# Patient Record
Sex: Male | Born: 1965
Health system: Southern US, Community
[De-identification: ages and names within clinical notes are randomized; demographics above are authoritative.]

## PROBLEM LIST (undated history)

## (undated) DIAGNOSIS — F419 Anxiety disorder, unspecified: Secondary | ICD-10-CM

## (undated) DIAGNOSIS — R001 Bradycardia, unspecified: Secondary | ICD-10-CM

## (undated) DIAGNOSIS — K219 Gastro-esophageal reflux disease without esophagitis: Secondary | ICD-10-CM

## (undated) DIAGNOSIS — M5030 Other cervical disc degeneration, unspecified cervical region: Secondary | ICD-10-CM

## (undated) DIAGNOSIS — Z720 Tobacco use: Secondary | ICD-10-CM

## (undated) DIAGNOSIS — F329 Major depressive disorder, single episode, unspecified: Secondary | ICD-10-CM

## (undated) DIAGNOSIS — F32A Depression, unspecified: Secondary | ICD-10-CM

## (undated) DIAGNOSIS — F1010 Alcohol abuse, uncomplicated: Secondary | ICD-10-CM

## (undated) DIAGNOSIS — Q7649 Other congenital malformations of spine, not associated with scoliosis: Secondary | ICD-10-CM

## (undated) DIAGNOSIS — F1410 Cocaine abuse, uncomplicated: Secondary | ICD-10-CM

## (undated) DIAGNOSIS — E119 Type 2 diabetes mellitus without complications: Secondary | ICD-10-CM

## (undated) HISTORY — DX: Cocaine abuse, uncomplicated: F14.10

## (undated) HISTORY — DX: Major depressive disorder, single episode, unspecified: F32.9

## (undated) HISTORY — DX: Gastro-esophageal reflux disease without esophagitis: K21.9

## (undated) HISTORY — DX: Depression, unspecified: F32.A

## (undated) HISTORY — DX: Tobacco use: Z72.0

## (undated) HISTORY — DX: Alcohol abuse, uncomplicated: F10.10

## (undated) HISTORY — DX: Other cervical disc degeneration, unspecified cervical region: M50.30

## (undated) HISTORY — DX: Anxiety disorder, unspecified: F41.9

## (undated) HISTORY — DX: Other congenital malformations of spine, not associated with scoliosis: Q76.49

---

## 2002-08-08 ENCOUNTER — Emergency Department (HOSPITAL_COMMUNITY): Admission: EM | Admit: 2002-08-08 | Discharge: 2002-08-08 | Payer: Self-pay | Admitting: Emergency Medicine

## 2004-06-05 ENCOUNTER — Emergency Department (HOSPITAL_COMMUNITY): Admission: EM | Admit: 2004-06-05 | Discharge: 2004-06-05 | Payer: Self-pay

## 2004-09-12 ENCOUNTER — Emergency Department (HOSPITAL_COMMUNITY): Admission: EM | Admit: 2004-09-12 | Discharge: 2004-09-12 | Payer: Self-pay | Admitting: Emergency Medicine

## 2006-08-31 ENCOUNTER — Emergency Department (HOSPITAL_COMMUNITY): Admission: EM | Admit: 2006-08-31 | Discharge: 2006-08-31 | Payer: Self-pay | Admitting: Emergency Medicine

## 2008-11-23 ENCOUNTER — Emergency Department (HOSPITAL_COMMUNITY): Admission: EM | Admit: 2008-11-23 | Discharge: 2008-11-23 | Payer: Self-pay | Admitting: Emergency Medicine

## 2009-11-14 ENCOUNTER — Emergency Department (HOSPITAL_COMMUNITY): Admission: EM | Admit: 2009-11-14 | Discharge: 2009-11-14 | Payer: Self-pay | Admitting: Emergency Medicine

## 2010-01-23 ENCOUNTER — Emergency Department (HOSPITAL_COMMUNITY): Admission: EM | Admit: 2010-01-23 | Discharge: 2010-01-23 | Payer: Self-pay | Admitting: Emergency Medicine

## 2010-09-16 LAB — URINE MICROSCOPIC-ADD ON

## 2010-09-16 LAB — URINALYSIS, ROUTINE W REFLEX MICROSCOPIC
Bilirubin Urine: NEGATIVE
Glucose, UA: NEGATIVE mg/dL
Ketones, ur: NEGATIVE mg/dL
Nitrite: NEGATIVE
Protein, ur: NEGATIVE mg/dL
Specific Gravity, Urine: 1.023 (ref 1.005–1.030)
Urobilinogen, UA: 0.2 mg/dL (ref 0.0–1.0)
pH: 5 (ref 5.0–8.0)

## 2010-11-06 ENCOUNTER — Emergency Department (HOSPITAL_COMMUNITY): Payer: Self-pay

## 2010-11-06 ENCOUNTER — Emergency Department (HOSPITAL_COMMUNITY)
Admission: EM | Admit: 2010-11-06 | Discharge: 2010-11-06 | Discharge status: Against Medical Advice | Payer: Self-pay | Attending: Emergency Medicine | Admitting: Emergency Medicine

## 2010-11-06 ENCOUNTER — Observation Stay (HOSPITAL_COMMUNITY)
Admission: EM | Admit: 2010-11-06 | Discharge: 2010-11-07 | Disposition: A | Discharge status: Home / Self Care | Payer: Self-pay | Attending: Internal Medicine | Admitting: Internal Medicine

## 2010-11-06 ENCOUNTER — Encounter: Payer: Self-pay | Admitting: Ophthalmology

## 2010-11-06 DIAGNOSIS — J309 Allergic rhinitis, unspecified: Secondary | ICD-10-CM | POA: Insufficient documentation

## 2010-11-06 DIAGNOSIS — R0789 Other chest pain: Secondary | ICD-10-CM | POA: Insufficient documentation

## 2010-11-06 DIAGNOSIS — I498 Other specified cardiac arrhythmias: Secondary | ICD-10-CM | POA: Insufficient documentation

## 2010-11-06 DIAGNOSIS — R001 Bradycardia, unspecified: Secondary | ICD-10-CM

## 2010-11-06 DIAGNOSIS — M5030 Other cervical disc degeneration, unspecified cervical region: Secondary | ICD-10-CM | POA: Insufficient documentation

## 2010-11-06 DIAGNOSIS — R071 Chest pain on breathing: Secondary | ICD-10-CM | POA: Insufficient documentation

## 2010-11-06 DIAGNOSIS — M1990 Unspecified osteoarthritis, unspecified site: Secondary | ICD-10-CM

## 2010-11-06 DIAGNOSIS — R079 Chest pain, unspecified: Secondary | ICD-10-CM

## 2010-11-06 DIAGNOSIS — F1910 Other psychoactive substance abuse, uncomplicated: Secondary | ICD-10-CM | POA: Insufficient documentation

## 2010-11-06 DIAGNOSIS — Q7649 Other congenital malformations of spine, not associated with scoliosis: Secondary | ICD-10-CM

## 2010-11-06 LAB — DIFFERENTIAL
Basophils Absolute: 0.1 10*3/uL (ref 0.0–0.1)
Basophils Absolute: 0.1 10*3/uL (ref 0.0–0.1)
Basophils Relative: 1 % (ref 0–1)
Basophils Relative: 1 % (ref 0–1)
Eosinophils Absolute: 0.3 10*3/uL (ref 0.0–0.7)
Eosinophils Absolute: 0.2 10*3/uL (ref 0.0–0.7)
Eosinophils Relative: 4 % (ref 0–5)
Eosinophils Relative: 3 % (ref 0–5)
Lymphocytes Relative: 37 % (ref 12–46)
Lymphocytes Relative: 30 % (ref 12–46)
Lymphs Abs: 3.4 10*3/uL (ref 0.7–4.0)
Monocytes Absolute: 0.7 10*3/uL (ref 0.1–1.0)
Monocytes Absolute: 0.5 10*3/uL (ref 0.1–1.0)
Monocytes Relative: 8 % (ref 3–12)
Monocytes Relative: 6 % (ref 3–12)
Neutro Abs: 4.6 10*3/uL (ref 1.7–7.7)
Neutro Abs: 4.8 10*3/uL (ref 1.7–7.7)
Neutrophils Relative %: 50 % (ref 43–77)
Neutrophils Relative %: 59 % (ref 43–77)

## 2010-11-06 LAB — CBC
HCT: 40.4 % (ref 39.0–52.0)
HCT: 41 % (ref 39.0–52.0)
HCT: 38.9 % — ABNORMAL LOW (ref 39.0–52.0)
Hemoglobin: 13.5 g/dL (ref 13.0–17.0)
Hemoglobin: 13.5 g/dL (ref 13.0–17.0)
MCH: 29 pg (ref 26.0–34.0)
MCH: 30 pg (ref 26.0–34.0)
MCHC: 33.4 g/dL (ref 30.0–36.0)
MCHC: 34.9 g/dL (ref 30.0–36.0)
MCHC: 34.7 g/dL (ref 30.0–36.0)
MCV: 86.1 fL (ref 78.0–100.0)
MCV: 86.6 fL (ref 78.0–100.0)
Platelets: 204 10*3/uL (ref 150–400)
Platelets: 215 10*3/uL (ref 150–400)
Platelets: 182 10*3/uL (ref 150–400)
RBC: 4.66 MIL/uL (ref 4.22–5.81)
RBC: 4.76 MIL/uL (ref 4.22–5.81)
RBC: 4.49 MIL/uL (ref 4.22–5.81)
RDW: 14.8 % (ref 11.5–15.5)
RDW: 14.9 % (ref 11.5–15.5)
RDW: 14.8 % (ref 11.5–15.5)
WBC: 9.1 10*3/uL (ref 4.0–10.5)

## 2010-11-06 LAB — BASIC METABOLIC PANEL
BUN: 11 mg/dL (ref 6–23)
BUN: 11 mg/dL (ref 6–23)
CO2: 25 mEq/L (ref 19–32)
CO2: 32 mEq/L (ref 19–32)
Calcium: 9.4 mg/dL (ref 8.4–10.5)
Calcium: 9.2 mg/dL (ref 8.4–10.5)
Creatinine, Ser: 1.08 mg/dL (ref 0.4–1.5)
Creatinine, Ser: 1.15 mg/dL (ref 0.4–1.5)
GFR calc Af Amer: >60 mL/min (ref >60)
GFR calc Af Amer: >60 mL/min (ref >60)
GFR calc non Af Amer: >60 mL/min (ref >60)
GFR calc non Af Amer: >60 mL/min (ref >60)
Glucose, Bld: 99 mg/dL (ref 70–99)
Glucose, Bld: 92 mg/dL (ref 70–99)
Sodium: 141 mEq/L (ref 135–145)
Sodium: 142 mEq/L (ref 135–145)

## 2010-11-06 LAB — HEPATIC FUNCTION PANEL
ALT: 12 U/L (ref 0–53)
AST: 14 U/L (ref 0–37)
Albumin: 3.6 g/dL (ref 3.5–5.2)
Alkaline Phosphatase: 59 U/L (ref 39–117)
Bilirubin, Direct: <0.1 mg/dL (ref 0.0–0.3)
Total Bilirubin: 0.3 mg/dL (ref 0.3–1.2)
Total Protein: 6.4 g/dL (ref 6.0–8.3)

## 2010-11-06 LAB — CARDIAC PANEL(CRET KIN+CKTOT+MB+TROPI)
CK, MB: 1.6 ng/mL (ref 0.3–4.0)
Relative Index: 1.4 (ref 0.0–2.5)
Total CK: 111 U/L (ref 7–232)
Troponin I: <0.3 ng/mL (ref <0.30)

## 2010-11-06 LAB — POCT CARDIAC MARKERS
CKMB, poc: <1 ng/mL — ABNORMAL LOW (ref 1.0–8.0)
CKMB, poc: <1 ng/mL — ABNORMAL LOW (ref 1.0–8.0)
Myoglobin, poc: 29.4 ng/mL (ref 12–200)
Myoglobin, poc: 26.3 ng/mL (ref 12–200)
Troponin i, poc: <0.05 ng/mL (ref 0.00–0.09)
Troponin i, poc: <0.05 ng/mL (ref 0.00–0.09)

## 2010-11-06 LAB — RAPID URINE DRUG SCREEN, HOSP PERFORMED
Cocaine: POSITIVE — AB
Opiates: NOT DETECTED
Tetrahydrocannabinol: POSITIVE — AB

## 2010-11-06 LAB — HEMOGLOBIN A1C
Hgb A1c MFr Bld: 6.1 % — ABNORMAL HIGH (ref <5.7)
Mean Plasma Glucose: 128 mg/dL — ABNORMAL HIGH (ref <117)

## 2010-11-06 LAB — CK TOTAL AND CKMB (NOT AT ARMC)
CK, MB: 1.7 ng/mL (ref 0.3–4.0)
Relative Index: 1.3 (ref 0.0–2.5)

## 2010-11-06 LAB — TROPONIN I: Troponin I: <0.3 ng/mL (ref <0.30)

## 2010-11-06 NOTE — H&P (Signed)
Hospital Admission Note Date: 11/06/2010  Patient name: William Wood Medical record number: [id number] Date of birth: [birth year] Age: 45 y.o. Gender: male PCP: No primary provider on file.  Medical Service: Internal Medicine Teaching Service  Attending physician: Dr. Onalee Hua Resident [phone]): Dr. Arvilla Market    [phone] Resident (R1): Dr. Cathey Endow    Pager: [phone]  Chief Complaint: Chest Pain  History of Present Illness: This is a 45 year old male with past medical history significant for GERD who presents because of a several week history of chest pain. The patient states that over the last several weeks, has had intermittent chest pain that was substernal in location and did not radiate. The pain is made worse by movement, but not particularly associated with exertion. There is no particular inciting event, and this does not appear to be related to food intake. Last night, the patient had a large meal between 6 and 8 PM, states that he laid down to bed and developed severe substernal chest pain. This chest pain was similar to prior events but worse, it was associated with mild shortness of breath but no diaphoresis, nausea, or vomiting. The patient's pain is a 9/10 in intensity when it occurs, is intermittent, and described as sharp. The pain lasts for approximately 5 seconds and occurs every 30 seconds. The patient has not tried any medications for his symptoms, but because of his concern, he came to the ED on the morning of admission. The patient was evaluated, but decided to leave AMA. On the way back to the bus stop, the patient developed worsening chest pain once again, and decided to return to the ED for admission. At the time of his evaluation, the patient was receiving a GI cocktail for his symptoms, he did not receive any aspirin during this ER visit because he had received it in his prior ED visit.  At the time of his admission, the patient denied any abdominal pain, nausea, vomiting, recent  fevers or chills, but he still had moderate chest pain.  Allergies: No known drug allergies  Past Medical History: GERD, allergic rhinitis. 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 Degenerative disc disease at C5-C6.  Family History: Grandmother with heart failure, grandfather with unknown cancer.  Social History Single, lives in [place], lives with a roommate. The patient has smoked one pack a day since he was 45 years old. The patient uses marijuana as well as cocaine, and last used cocaine 3 days ago.  Review of Systems: Negative as per HPI.   VITALS: T: 98.5 P:50  BP:135/88  R:24  O2SAT: 100% ON:RA  PHYSICAL EXAM: General:  alert, well-developed, and cooperative to examination.   Head:  normocephalic and atraumatic.   Eyes:  vision grossly intact, pupils equal, pupils round, pupils reactive to light, no injection and anicteric.   Mouth:  pharynx pink and moist, no erythema, and no exudates.   Neck:  supple, full ROM, no thyromegaly, no JVD, and no carotid bruits.   Lungs:  normal respiratory effort, no accessory muscle use, normal breath sounds, no crackles, and no wheezes.  Heart: Difficult to auscultate, bradycardia, regular rhythm, no obvious murmur, no gallop, and no rub.  No tenderness to chest palpation. Abdomen:  soft, non-tender, normal bowel sounds, no distention, no guarding, no rebound tenderness, no hepatomegaly, and no splenomegaly.   Msk:  no joint swelling, no joint warmth, and no redness over joints.   Pulses:  2+ DP/PT pulses bilaterally Extremities:  No cyanosis, clubbing, edema  Neurologic:  alert &  oriented X3, cranial nerves II-XII intact, strength normal in all extremities, sensation intact to light touch.  Skin:  turgor normal and no rashes.   Psych: Not depressed or anxious appearing.  LABS:  WBC                                      9.1               4.0-10.5         K/uL  RBC                                      4.66              4.22-5.81         MIL/uL  Hemoglobin (HGB)                         13.5              13.0-17.0        g/dL  Hematocrit (HCT)                         40.4              39.0-52.0        %  MCV                                      86.7              78.0-100.0       fL  MCH -                                    29.0              26.0-34.0        pg  MCHC                                     33.4              30.0-36.0        g/dL  RDW                                      14.8              11.5-15.5        %  Platelet Count (PLT)                     204               150-400          K/uL  Neutrophils, %                           50  43-77            %  Lymphocytes, %                           37                12-46            %  Monocytes, %                             8                 3-12             %  Eosinophils, %                           4                 0-5              %  Basophils, %                             1                 0-1              %  Neutrophils, Absolute                    4.6               1.7-7.7          K/uL  Lymphocytes, Absolute                    3.4               0.7-4.0          K/uL  Monocytes, Absolute                      0.7               0.1-1.0          K/uL  Eosinophils, Absolute                    0.3               0.0-0.7          K/uL  Basophils, Absolute                      0.1               0.0-0.1          K/uL   Sodium (NA)                              141               135-145          mEq/L  Potassium (K)                            4.0  3.5-5.1          mEq/L  Chloride                                 108               96-112           mEq/L  CO2                                      25                19-32            mEq/L  Glucose                                  99                70-99            mg/dL  BUN                                      11                6-23             mg/dL  Creatinine                               1.08               0.4-1.5          mg/dL  GFR, Est Non African American            >60               >60              mL/min  GFR, Est African American                >60               >60              mL/min    Oversized comment, see footnote  1  Calcium                                  9.4               8.4-10.5         mg/dL    Amphetamins                              SEE NOTE.         NDT    NONE DETECTED  Barbiturates                             SEE NOTE.         NDT    Oversized comment, see footnote  1  Benzodiazepines  SEE NOTE.         NDT    NONE DETECTED  Cocaine                                  POSITIVE   a      NDT  Opiates                                  SEE NOTE.         NDT    NONE DETECTED  Tetrahydrocannabinol                     POSITIVE   a      NDT   CKMB, POC                                <1.0       l      1.0-8.0          ng/mL  Troponin I, POC                          <0.05             0.00-0.09        ng/mL  Myoglobin, POC                           26.3              12-200           Ng/mL  Creatine Kinase, Total                   126               7-232            U/L  CK, MB                                   1.7               0.3-4.0          ng/mL  Relative Index                           1.3               0.0-2.5  Troponin I                               <0.30             <0.30            Ng/mL  Bilirubin, Total                         0.3               0.3-1.2          mg/dL  Bilirubin, Direct                        <  0.1              0.0-0.3          mg/dL  Indirect Bilirubin                       SEE NOTE.         0.3-0.9          mg/dL    NOT CALCULATED  Alkaline Phosphatase                     59                39-117           U/L  SGOT (AST)                               14                0-37             U/L  SGPT (ALT)                               12                0-53             U/L  Total  Protein                            6.4               6.0-8.3          g/dL  Albumin-Blood                            3.6               3.5-5.2          g/dL   IMAGING: Chest X-ray  IMPRESSION:   No active cardiopulmonary disease.  EKG: EKG showed marked sinus bradycardia with a ventricular rate of 40, there are Q waves in leads V1 2 and 3.  ASSESSMENT AND PLAN:  This is a 45 year old gentleman with a past medical history significant for GERD who presents with chest pain in the setting of recent cocaine use.  1. Chest pain: The patient's pain is atypical in presentation, and I do not suspect any ACS at this time. Point-of-care cardiac markers were negative, there are no significant ST wave changes on EKG, though there do appear to be old Q waves in the anterior septal leads. The patient has no history of early ACS, and his pain though worsened with movement, does not appear to be associated with exertion. The differential diagnosis also includes GI etiology such as GERD, as well as pulmonary embolism though the latter is extremely unlikely given that the patient is not hypoxic. This could represent vasospasm from cocaine use, however the patient has not used cocaine in 3 days per his report. At this point, we will admit the patient to the telemetry unit, cycle cardiac enzymes x3, and get an EKG in the a.m. Because of a high suspicion for GI etiology, will start the patient on Protonix 40 mg daily, and treat him symptomatically with GI cocktail. Will also  check a TSH, hemoglobin A1c, fasting lipid panel, all for risk stratification.  2. Bradycardia: The etiology of this is unclear, but the patient appears to be asymptomatic at this time. The patient does have Q waves in his anteroseptal leads, which could represent old ischemia leading to conduction abnormalities. At this point time, we'll complete an ischemic workup, and we'll continue to monitor on telemetry. Baseline heart rate is 61- 63. So this could be related to the patient's  low Baseline heart rate.  3. prophylaxis: Lovenox.     Attending Physician: I have seen and examined the patient. I reviewed the resident/fellow note and agree with the findings and plan of care as documented. My additions and revisions are included.   Signature:   Date:

## 2010-11-07 DIAGNOSIS — J309 Allergic rhinitis, unspecified: Secondary | ICD-10-CM

## 2010-11-07 DIAGNOSIS — R001 Bradycardia, unspecified: Secondary | ICD-10-CM

## 2010-11-07 DIAGNOSIS — Q7649 Other congenital malformations of spine, not associated with scoliosis: Secondary | ICD-10-CM

## 2010-11-07 DIAGNOSIS — M1990 Unspecified osteoarthritis, unspecified site: Secondary | ICD-10-CM

## 2010-11-07 LAB — CBC
MCHC: 34.1 g/dL (ref 30.0–36.0)
Platelets: 201 10*3/uL (ref 150–400)
RDW: 14.9 % (ref 11.5–15.5)
WBC: 8.5 10*3/uL (ref 4.0–10.5)

## 2010-11-07 LAB — CARDIAC PANEL(CRET KIN+CKTOT+MB+TROPI)
CK, MB: 1.6 ng/mL (ref 0.3–4.0)
Total CK: 104 U/L (ref 7–232)

## 2010-11-07 LAB — BASIC METABOLIC PANEL
BUN: 13 mg/dL (ref 6–23)
CO2: 27 mEq/L (ref 19–32)
Calcium: 9.2 mg/dL (ref 8.4–10.5)
GFR calc non Af Amer: >60 mL/min (ref >60)
Glucose, Bld: 93 mg/dL (ref 70–99)

## 2010-11-07 NOTE — Discharge Summary (Signed)
For full DC summary please see discharge summary in [facility].  In brief this is a 45 year old male with a past medial hx significant for GERD who presented with atypical chest pain.  Cardiac enzymes were negative x 3 and EKG only demonstrated sinus bradycardia (though there was concern for possible junctional rhythm).  Pt did have bradycardia to the 40's but this was asymptomatic.  At follow up, check HR and ensure that this continues to be asymptomatic.  No labs are needed.  Medications and problem list have been updated.

## 2010-11-23 ENCOUNTER — Ambulatory Visit (INDEPENDENT_AMBULATORY_CARE_PROVIDER_SITE_OTHER): Payer: Self-pay | Admitting: Internal Medicine

## 2010-11-23 ENCOUNTER — Encounter: Payer: Self-pay | Admitting: Internal Medicine

## 2010-11-23 DIAGNOSIS — Z5987 Material hardship due to limited financial resources, not elsewhere classified: Secondary | ICD-10-CM

## 2010-11-23 DIAGNOSIS — Z720 Tobacco use: Secondary | ICD-10-CM

## 2010-11-23 DIAGNOSIS — F411 Generalized anxiety disorder: Secondary | ICD-10-CM

## 2010-11-23 DIAGNOSIS — F1010 Alcohol abuse, uncomplicated: Secondary | ICD-10-CM

## 2010-11-23 DIAGNOSIS — F419 Anxiety disorder, unspecified: Secondary | ICD-10-CM

## 2010-11-23 DIAGNOSIS — F1410 Cocaine abuse, uncomplicated: Secondary | ICD-10-CM

## 2010-11-23 DIAGNOSIS — F329 Major depressive disorder, single episode, unspecified: Secondary | ICD-10-CM

## 2010-11-23 DIAGNOSIS — Z598 Other problems related to housing and economic circumstances: Secondary | ICD-10-CM | POA: Insufficient documentation

## 2010-11-23 DIAGNOSIS — Z13220 Encounter for screening for lipoid disorders: Secondary | ICD-10-CM

## 2010-11-23 DIAGNOSIS — F17200 Nicotine dependence, unspecified, uncomplicated: Secondary | ICD-10-CM

## 2010-11-23 DIAGNOSIS — F32A Depression, unspecified: Secondary | ICD-10-CM

## 2010-11-23 HISTORY — DX: Alcohol abuse, uncomplicated: F10.10

## 2010-11-23 HISTORY — DX: Tobacco use: Z72.0

## 2010-11-23 HISTORY — DX: Cocaine abuse, uncomplicated: F14.10

## 2010-11-23 NOTE — Patient Instructions (Signed)
Stop out front and make an appointment to meet with Dorothe Pea for help with employment, housing, and materials on quitting tobacco, alcohol, and drugs.   When you come back for that appointment stop in the lab to have your blood drawn.  Please do the blood draw before you eat breakfast.  If you have any problems please call [phone] for our clinic.

## 2010-11-23 NOTE — Progress Notes (Signed)
  Subjective:    Patient ID: William Wood, male    DOB: May 11, 1966, 45 y.o.   MRN: [id number]  HPI  William Wood is a 45 year old man who is new to clinic today.  He was hospitalized under the teaching service for atypical chest pain that was ruled out for ACS as well as asymptomatic bradycardia.  He has not been followed in the past by a primary care doctor and is here to establish primary care.  He currently takes no medications.    He has been treated for depression, anxiety, and drug use in the past.  He also has had several orthopedic procedures in the past as well.  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  He also smokes marijuana every other day as well as 1/2 ppd of cigarettes.  He states that he has been through rehab before but currently is living with an uncle who does drugs and drinks as well.  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    Review of Systems    Constitutional: Denies fever, chills, diaphoresis, appetite change and fatigue.  HEENT: Denies photophobia, eye pain, redness, hearing loss, ear pain, congestion, sore throat, rhinorrhea, sneezing, mouth sores, trouble swallowing, neck pain, neck stiffness and tinnitus.   Respiratory: Denies SOB, DOE, cough, chest tightness,  and wheezing.   Cardiovascular: Denies chest pain, palpitations and leg swelling.  Gastrointestinal: Denies nausea, vomiting, abdominal pain, diarrhea, constipation, blood in stool and abdominal distention.  Genitourinary: Denies dysuria, urgency, frequency, hematuria, flank pain and difficulty urinating.  Musculoskeletal: Denies myalgias, back pain, joint swelling, arthralgias and gait problem.  Skin: Denies pallor, rash and wound.  Neurological: Denies dizziness, seizures, syncope, weakness, light-headedness, numbness and headaches.  Hematological: Denies adenopathy. Easy bruising,  personal or family bleeding history  Psychiatric/Behavioral: Denies suicidal ideation, mood changes, confusion, nervousness, sleep disturbance and agitation   Objective:   Physical Exam    Constitutional: Vital signs reviewed.  Patient is a well-developed and well-nourished man in no acute distress and cooperative with exam. Alert and oriented x3.  Head: Normocephalic and atraumatic Ear: TM normal bilaterally Mouth: no erythema or exudates, MMM Eyes: PERRL, EOMI, conjunctivae normal, No scleral icterus.  Neck: Supple, Trachea midline normal ROM, No JVD, mass, thyromegaly, or carotid bruit present.  Cardiovascular: bradycardic rate but regular rhythm, S1 normal, S2 normal, no MRG, pulses symmetric and intact bilaterally Pulmonary/Chest: CTAB, no wheezes, rales, or rhonchi Abdominal: Soft. Non-tender, non-distended, bowel sounds are normal, no masses, organomegaly, or guarding present.  GU: no CVA tenderness Musculoskeletal: No joint deformities, erythema, or stiffness, ROM full and no nontender Hematology: no cervical, inginal, or axillary adenopathy.  Neurological: A&O x3, Strenght is normal and symmetric bilaterally, cranial nerve II-XII are grossly intact, no focal motor deficit, sensory intact to light touch bilaterally.  Skin: Warm, dry and intact. No rash, cyanosis, or clubbing.  Psychiatric: Normal mood and flat affect. speech and behavior is normal. Judgment and thought content normal. Cognition and memory are normal.   Assessment & Plan:

## 2010-11-24 ENCOUNTER — Telehealth: Payer: Self-pay | Admitting: Licensed Clinical Social Worker

## 2010-11-24 NOTE — Discharge Summary (Signed)
Wood, William                ACCOUNT NO.:  [id number]  MEDICAL RECORD NO.:  [id number]           PATIENT TYPE:  O  LOCATION:  4735                         FACILITY:  [facility]  PHYSICIAN:  Doneen Poisson, MD     DATE OF BIRTH:  July 25, 1965  DATE OF ADMISSION:  11/06/2010 DATE OF DISCHARGE:  11/07/2010                              DISCHARGE SUMMARY   DISCHARGE DIAGNOSES: 1. Atypical chest pain. 2. Bradycardia. 3. Allergic rhinitis. 4. 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 5. Degenerative disk disease at C5-C6.  DISCHARGE MEDICATIONS: 1. Aspirin 81 mg tablets, take 1 tablet by mouth daily. 2. Nexium 20 mg tablets, take 1 tablet by mouth daily.  DISPOSITION AND FOLLOWUP:  Mr. Mole is scheduled for follow up in the outpatient clinic at [facility] on Nov 23, 2010, at 3:15 p.m. with Dr. Tonny Branch.  At that time, his symptoms of chest pain should be reevaluated to ensure that he has received improvement with Nexium.  He should also be questioned regarding any syncopal events or any other concerning symptoms given his now currently asymptomatic bradycardia.  His labs during this hospitalization were  within normal limits other than a very mildly decreased hemoglobin which  was likely secondary to volume administration.  I do not feel that he  needs repeat labs at his next visit.  If he continues to have chest pain  at his next visit, I would recommend a stress EKG given his current EKG  findings and history of cocaine use, however, I would also consider  further GI workup including possible barium swallow versus EGD.  Two-view chest x-ray was performed on Nov 06, 2010, which demonstrated no active cardiopulmonary disease.  CONSULTATIONS:  None.  BRIEF ADMITTING HISTORY AND PHYSICAL:  This is a 45 year old man with a past medical history significant for GERD who presents because of a several-week history of chest pain.  He states that over the last several weeks, he has  had intermittent chest pain that was substernal in location and did not radiate.  The pain was made worse by movement but was not particularly associated with exertion.  There was no particular inciting event, and it does not appear to be related to food intake.  The night prior to his admission, he states that he had large meal between 6 and 8 p.m.  He then laid down to go to bed and developed severe substernal chest pain.  The pain was similar to prior events but worse, it was described as 9/10 in intensity and lasted about 5 seconds coming and going every 30 seconds.  He was initially evaluated in the ED at [facility], however, he did leave AMA, at which time he developed worsening chest pain and decided to come back to the ED.  He was then admitted to rule out a myocardial infarction.  ALLERGIES:  No known drug allergies.  PAST MEDICAL HISTORY:  GERD, allergic rhinitis, congenital C-spine fusion at C6-C7, degenerative disk disease at C5-C6.  MEDICATIONS:  None.  PHYSICAL EXAMINATION: VITAL SIGNS:  Temperature 98.5, pulse 50, blood pressure 135/88,  respiratory rate 24, O2 sat  100% on room air. GENERAL:  Alert, well developed, cooperative to examination. HEAD:  Normocephalic, atraumatic. EYES:  Vision grossly intact.  Pupils equal, round, reactive to light. MOUTH:  Pharynx pink and moist.  No erythema or exudates. NECK:  Supple.  Full range of motion.  No thyromegaly or JVD. LUNGS:  Clear to auscultation.  No crackles, wheezes, or rubs. HEART:  Difficult to auscultate, however, bradycardic, with regular rhythm and no obvious murmurs, gallops, or rubs. ABDOMEN:  Soft, nontender, normal bowel sounds.  No distention or guarding. EXTREMITIES:  Pulses 2+ DP/PT pulses bilaterally. NEUROLOGIC:  Nonfocal.  LABORATORY DATA ON ADMISSION:  White blood cell count 9.1, hemoglobin 13.5, hematocrit 40.4, platelet count 204.  Sodium 141, potassium 4.0, chloride 101, bicarb 25, glucose 99,  BUN 11, creatinine 1.08.  UDS was positive for cocaine as well as marijuana.  Point-of-care cardiac markers were negative.  HOSPITAL COURSE BY PROBLEM: 1. Chest pain:  [name] presents with atypical symptoms for acute     coronary syndrome, however, given his smoking history as well as     recent cocaine use, he was admitted to the telemetry unit     for further monitoring as well as to cycle his cardiac enzymes.     Cardiac enzymes were negative x 3, and his telemetry only     demonstrated bradycardia which is stable and asymptomatic.  The     differential diagnosis at this time is broad given his     atypical symptoms and includes musculoskeletal etiologies, GERD,     gastritis, esophageal pathology such as diffuse esophageal spasm,     and angina due to coronary vasospasm from cocaine use.  At this     point in time, given his history of GERD and atypical     symptoms, I feel that this is most likely of GI etiology and he     responded reasonably well to treatment with a GI cocktail     and PPI here in the hospital.  On day two of his hospitalization,     he was chest pain free and was in improved condition.     Although a GI etiology is highest in the differential, if he     continues to have chest pain at his follow-up appointment     in the future, I would recommend a stress EKG given his EKG     changes showing bradycardia during this admission to rule     out cardiac pathology.  Otherwise, I would recommend consideration     of EGD versus barium swallow to rule out esophageal disease.  He     has not ever been tested for HIV, and esophageal candidiasis could      present in a similar fashion; however, I have a low suspicion for      that at this time.  2. Bradycardia:  This is asymptomatic, his baseline heart     rate appears to be in the 60 range, and his heart rate was     ranging in the 40s-50s during his hospital stay here.  He     denies any recent history of syncope  or other concerning     symptoms and EKG simply demonstrated bradycardia.  Since he is      asymptomatic, it is reasonable he be discharged home with      continued monitoring of this bradycardia as an outpatient.  DISCHARGE LABORATORY DATA:  White blood cell count 8.5, hemoglobin  12.9,  hematocrit 37.8, platelets 201.  Sodium 139, potassium 3.7, chloride 103,  bicarb 27, glucose 93, BUN 13, creatinine 0.97.  VITAL SIGNS:  Temperature 97.6, blood pressure 90/56, heart rate 47, O2 saturation 99% on room air.   ______________________________ Sinda Du, MD   ______________________________ Doneen Poisson, MD   BB/MEDQ  D:  11/07/2010  T:  11/08/2010  Job:  [id number]  cc:   [facility]  Electronically Signed by Sinda Du MD on 11/16/2010 11:01:20 AM Electronically Signed by Doneen Poisson  on 11/24/2010 07:22:32 PM

## 2010-11-29 NOTE — Telephone Encounter (Signed)
Called patient and we will meet on Monday June 4th at 9:15 AM for assessment and planning.

## 2010-12-01 ENCOUNTER — Encounter: Payer: Self-pay | Admitting: Internal Medicine

## 2010-12-01 NOTE — Assessment & Plan Note (Signed)
He states he has been treated in the past for depression but does not currently feel like he needs medications.  We discussed warning signs and he states that if he feels he needs help he will ask.

## 2010-12-01 NOTE — Assessment & Plan Note (Signed)
He was counseled on cessation for cocaine, alcohol and tobacco.  We will have him follow up with Dorothe Pea for help with getting connected to support groups and any other help he needs.

## 2010-12-01 NOTE — Assessment & Plan Note (Signed)
He was counseled on alcohol cessation and he will meet with Dorothe Pea for help with getting connected with resources.

## 2010-12-01 NOTE — Assessment & Plan Note (Signed)
He currently has no health insurance and is not consistently employed.  We will have him meet with Chauncey Reading to get established with an [employer] and also with Dorothe Pea for help with vocational rehab and help finding steady work.  He may also need help with finding reliable housing since currently he lives with his uncle how drinks and does drugs per his report.  He also states that the uncle doesn't give him a key so he sometimes has to sleep outside until his uncle gets home.

## 2010-12-04 ENCOUNTER — Ambulatory Visit: Payer: Self-pay | Admitting: Licensed Clinical Social Worker

## 2010-12-04 DIAGNOSIS — F1910 Other psychoactive substance abuse, uncomplicated: Secondary | ICD-10-CM

## 2010-12-04 NOTE — Progress Notes (Signed)
Social Work.  60 minutes. Referred by Dr. Tonny Branch for multiple health and mental health issues.    Met with Quirino for assessment and planning re: multiple psychosocial issues.  Uninsured patient, working sporadically doing contracting work.   William Wood relates a very tragic early childhood in which his mother killed his father and his father was shot when [place] was 45 years old.  He lived and was raised by his grandparents and went up to the 9th grade.  He admits he was very spoiled by his grandmother and she did not push him to do well in school although he believes he had the brains to do very well in school.   Tor[place] was in prison from 1995 to 2002 for robbery.  He became certified in carpentry and blueprinting and got his GED in prison.  He received counseling services in prison and worked extensively with a psychologist;  He learned a lot about himself from that experience and said he was very spoiled as a child by his grandparents.   They died when he was 16 and things went downhill from that point.   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  He received counseling services in prison and learned a lot from that experience.   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   The work is not steady but he will get paid about $60 a day when he is working.    He is close to his brother Genevie Cheshire;  He lives with his Kateri Mc in Section 8 housing and tells me he really shouldn't be living there.   A/P;   --Encouraged Olander to connect with substance abuse counseling at [facility].  He agrees that he will struggle to keep jobs and housing without getting drugs out of his life.  We called [facility] and left a message to get him an appmt for assessment and counseling/his phone he says will be on tomorrow.   --He would also like regular permanent work and so several resources were provided including [employer],  [employer], and  Designer, fashion/clothing.   --Encouraged Malacai to visit with [employer] for affordable housing options.  F/U at his next appmt with Dr. Loistine Chance on June 18th, 9:00 AM.

## 2010-12-18 ENCOUNTER — Other Ambulatory Visit: Payer: Self-pay

## 2010-12-18 ENCOUNTER — Ambulatory Visit: Payer: Self-pay | Admitting: Licensed Clinical Social Worker

## 2010-12-18 ENCOUNTER — Encounter: Payer: Self-pay | Admitting: Internal Medicine

## 2010-12-18 DIAGNOSIS — Z13220 Encounter for screening for lipoid disorders: Secondary | ICD-10-CM

## 2010-12-18 DIAGNOSIS — F1910 Other psychoactive substance abuse, uncomplicated: Secondary | ICD-10-CM

## 2010-12-18 LAB — LIPID PANEL
HDL: 68 mg/dL (ref >39)
LDL Cholesterol: 75 mg/dL (ref 0–99)
Total CHOL/HDL Ratio: 2.5 Ratio

## 2010-12-18 NOTE — Progress Notes (Signed)
Follow-up visit with social work. 15 minutes.   William Wood advised me that he has moved in with his aunt and feels like this will be a better place for him since she will not tolerate any drugs.  William Wood said that he moved in and had to buy quite a few everyday items that his aunt didn't have in the house like food and toiletries.    William Wood was non-committal as to whether he will make appointment for counseling or follow on job resources given.   William Wood did not appear to be as alert and talkative as he was the first time I met him.  He reported he was late for his OV appmt this morning.  He is rescheduled to June  22nd in the afternoon and I praised him for his decision to get himself into an environment that will discourage drugs.

## 2010-12-22 ENCOUNTER — Encounter: Payer: Self-pay | Admitting: Internal Medicine

## 2011-01-01 ENCOUNTER — Ambulatory Visit: Payer: Self-pay | Admitting: Licensed Clinical Social Worker

## 2011-02-05 ENCOUNTER — Encounter: Payer: Self-pay | Admitting: Internal Medicine

## 2011-05-28 ENCOUNTER — Encounter: Payer: Self-pay | Admitting: Internal Medicine

## 2011-06-11 ENCOUNTER — Encounter (HOSPITAL_COMMUNITY): Payer: Self-pay

## 2011-06-11 ENCOUNTER — Emergency Department (HOSPITAL_COMMUNITY)
Admission: EM | Admit: 2011-06-11 | Discharge: 2011-06-11 | Disposition: A | Discharge status: Home / Self Care | Payer: Self-pay | Attending: Emergency Medicine | Admitting: Emergency Medicine

## 2011-06-11 DIAGNOSIS — M542 Cervicalgia: Secondary | ICD-10-CM | POA: Insufficient documentation

## 2011-06-11 DIAGNOSIS — R079 Chest pain, unspecified: Secondary | ICD-10-CM | POA: Insufficient documentation

## 2011-06-11 DIAGNOSIS — F341 Dysthymic disorder: Secondary | ICD-10-CM | POA: Insufficient documentation

## 2011-06-11 DIAGNOSIS — M25519 Pain in unspecified shoulder: Secondary | ICD-10-CM | POA: Insufficient documentation

## 2011-06-11 DIAGNOSIS — M79609 Pain in unspecified limb: Secondary | ICD-10-CM | POA: Insufficient documentation

## 2011-06-11 DIAGNOSIS — M79601 Pain in right arm: Secondary | ICD-10-CM

## 2011-06-11 HISTORY — DX: Bradycardia, unspecified: R00.1

## 2011-06-11 MED ORDER — CYCLOBENZAPRINE HCL 10 MG PO TABS: 10.0000 mg | ORAL_TABLET | Freq: Three times a day (TID) | ORAL | Status: AC | PRN

## 2011-06-11 MED ORDER — PREDNISONE 20 MG PO TABS: ORAL_TABLET | ORAL | Status: DC

## 2011-06-11 MED ORDER — TRAMADOL-ACETAMINOPHEN 37.5-325 MG PO TABS: ORAL_TABLET | ORAL | Status: AC

## 2011-06-11 NOTE — ED Notes (Signed)
Patient presents with right arm burning x 1 month with radiation to fingers into axilla.  Patient denies chest pain.

## 2011-06-11 NOTE — ED Provider Notes (Signed)
History     CSN: [id number] Arrival date & time: 06/11/2011 11:46 AM   First MD Initiated Contact with Patient 06/11/11 1252      Chief Complaint  Patient presents with  . Arm Pain    (Consider location/radiation/quality/duration/timing/severity/associated sxs/prior treatment) HPI  Patient relates for the past 3 or 4 weeks she's had a burning pain in his right upper extremity. He states he it starts in his right middle and right ring finger it radiates up his arm and his right neck. He denies any known injury. He states sometimes it goes into his right chest and indicates the right upper chest. He denies cough or shortness of breath he has never had it before. He states he has "for fusions" however he's never had surgery. He states dorsiflexion of his wrist makes the pain worse in certain ways he moves his head. Patient relates he was admitted recently for chest pain and is followed in the outpatient clinics.  Primary care physician outpatient clinics    Past Medical History  Diagnosis Date  . Anxiety   . Depression   . Bradycardia   Polysubstance abuse  History reviewed. No pertinent past surgical history.  History reviewed. No pertinent family history.  History  Substance Use Topics  . Smoking status: Current Everyday Smoker -- 0.5 packs/day    Types: Cigarettes  . Smokeless tobacco: Never Used  . Alcohol Use: 12.6 oz/week    21 Cans of beer per week  cocaine abuse Employed   Review of Systems  All other systems reviewed and are negative.    Allergies  Review of patient's allergies indicates no known allergies.  Home Medications   None  BP 127/83  Pulse 72  Temp(Src) 98.5 F (36.9 C) (Oral)  Resp 18  SpO2 99%  Vital signs normal  Physical Exam  Nursing note and vitals reviewed. Constitutional: He is oriented to person, place, and time. He appears well-developed and well-nourished.  Non-toxic appearance. He does not appear ill. No distress.  HENT:   Head: Normocephalic and atraumatic.  Right Ear: External ear normal.  Left Ear: External ear normal.  Nose: Nose normal. No mucosal edema or rhinorrhea.  Mouth/Throat: Oropharynx is clear and moist and mucous membranes are normal. No dental abscesses or uvula swelling.  Eyes: Conjunctivae and EOM are normal. Pupils are equal, round, and reactive to light.  Neck: Normal range of motion and full passive range of motion without pain. Neck supple.       Patient is tender in the right paraspinous and right trapezius muscle.  Cardiovascular: Exam reveals no gallop and no friction rub.   No murmur heard. Pulmonary/Chest: Effort normal. No respiratory distress. He has no rhonchi. He exhibits no crepitus.  Abdominal: Normal appearance.  Musculoskeletal: Normal range of motion. He exhibits no edema and no tenderness.       Moves all extremities well. Patient has intact abduction/abduction of his right shoulder with intact flexion and extension of his elbow and wrist. His ulnar nerve is intact of his fingers, his median and radial nerve are also intact. Pulses are intact. He states when he abducts his arm it hurts in his right shoulder. Also when he dorsiflexes his wrist it causes pain to go up his  right arm into his neck.  Neurological: He is alert and oriented to person, place, and time. He has normal strength. No cranial nerve deficit.  Skin: Skin is warm, dry and intact. No rash noted. No erythema. No pallor.  Psychiatric: He has a normal mood and affect. His speech is normal and behavior is normal. His mood appears not anxious.    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 Right arm pain     New Prescriptions   CYCLOBENZAPRINE (FLEXERIL) 10 MG TABLET    Take 1 tablet (10 mg total) by mouth 3 (three) times daily as needed for muscle spasms.   PREDNISONE (DELTASONE) 20 MG TABLET     Take 3 po QD x 3d, then 2 po QD x 3d then 1 po QD x 3d    TRAMADOL-ACETAMINOPHEN (ULTRACET) 37.5-325 MG PER TABLET    2 tabs po QID prn pain   Plan discharge with referral back to the outpatient clinics and neurosurgery.  MDM          Ward Givens, MD 06/11/11 [phone]

## 2011-07-05 ENCOUNTER — Encounter: Payer: Self-pay | Admitting: Internal Medicine

## 2011-07-06 ENCOUNTER — Encounter: Payer: Self-pay | Admitting: Internal Medicine

## 2011-07-19 ENCOUNTER — Encounter: Payer: Self-pay | Admitting: Internal Medicine

## 2011-08-09 ENCOUNTER — Encounter: Payer: Self-pay | Admitting: Internal Medicine

## 2011-08-09 ENCOUNTER — Ambulatory Visit (INDEPENDENT_AMBULATORY_CARE_PROVIDER_SITE_OTHER): Payer: Self-pay | Admitting: Internal Medicine

## 2011-08-09 ENCOUNTER — Telehealth: Payer: Self-pay | Admitting: *Deleted

## 2011-08-09 VITALS — BP 115/74 | HR 50 | Temp 97.3°F | Ht 66.75 in | Wt 148.1 lb

## 2011-08-09 DIAGNOSIS — N390 Urinary tract infection, site not specified: Secondary | ICD-10-CM

## 2011-08-09 DIAGNOSIS — M1990 Unspecified osteoarthritis, unspecified site: Secondary | ICD-10-CM

## 2011-08-09 LAB — CBC WITH DIFFERENTIAL/PLATELET
Basophils Absolute: 0.1 10*3/uL (ref 0.0–0.1)
Basophils Relative: 1 % (ref 0–1)
HCT: 39.6 % (ref 39.0–52.0)
Lymphocytes Relative: 29 % (ref 12–46)
MCHC: 33.8 g/dL (ref 30.0–36.0)
Neutro Abs: 5.3 10*3/uL (ref 1.7–7.7)
Neutrophils Relative %: 60 % (ref 43–77)
Platelets: 216 10*3/uL (ref 150–400)
RDW: 14.7 % (ref 11.5–15.5)
WBC: 8.8 10*3/uL (ref 4.0–10.5)

## 2011-08-09 LAB — COMPREHENSIVE METABOLIC PANEL
ALT: 18 U/L (ref 0–53)
AST: 20 U/L (ref 0–37)
Albumin: 4.7 g/dL (ref 3.5–5.2)
Alkaline Phosphatase: 55 U/L (ref 39–117)
BUN: 15 mg/dL (ref 6–23)
Calcium: 9.6 mg/dL (ref 8.4–10.5)
Chloride: 103 mEq/L (ref 96–112)
Potassium: 4.2 mEq/L (ref 3.5–5.3)
Sodium: 139 mEq/L (ref 135–145)
Total Protein: 6.9 g/dL (ref 6.0–8.3)

## 2011-08-09 LAB — POCT URINALYSIS DIPSTICK
Bilirubin, UA: NEGATIVE
Ketones, UA: NEGATIVE
Nitrite, UA: NEGATIVE
Protein, UA: NEGATIVE
pH, UA: 5.5

## 2011-08-09 MED ORDER — NAPROXEN 375 MG PO TABS: 375.0000 mg | ORAL_TABLET | Freq: Two times a day (BID) | ORAL | Status: AC

## 2011-08-09 NOTE — Patient Instructions (Signed)
Follow up in 1 month  Work with Ms. William Wood to get your orange card

## 2011-08-09 NOTE — Telephone Encounter (Signed)
Pt calls and states he thinks he has uti, freq vd and pressure since yesterday, he desires to be seen, appt given today 1415 dr Scot Dock per chilonb.

## 2011-08-12 NOTE — Assessment & Plan Note (Signed)
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 He says his pain actually started after the xrays were done since his jumped from the tree last year. Physical exam is basically unremarkable. Narcotics in not a good idea without a definite diagnosis of lumbar back pain and h/o PSA. Will try naprosyn. Will adivse stretching exercise, heat application, limiting wt bearing, excessive bending. Tylenol can be taking with NSAIDS. OTC prilosec if GERD gets worse. Follow up 1 month. May refer to PT at that time. Xray if no improvement

## 2011-08-12 NOTE — Progress Notes (Signed)
Patient ID: William Wood, male   DOB: 1965/12/01, 46 y.o.   MRN: [id number]  46 y/o m with pmh of cervical spine DJD, depression, anxiety and drug abuse (ongoing cocaine, marijuana, alcohol and tobacco abuse ) comes to clinic c/o back pain and requests narcotic pain medications Pain is mostly in lower back, on going since past 1 year, had some before as well but got worse after he jumped from a tree last year. Has tried tylenol that has not worked. He says he does not take advil because it will make his GERD worse.  No bowel or bladder incontinence, no tingling or numbness in lower extrmities or other neurological symptoms.    General Appearance:     Filed Vitals:   08/09/11 1352  BP: 115/74  Pulse: 50  Temp: 97.3 F (36.3 C)  TempSrc: Oral  Height: 5' 6.75" (1.695 m)  Weight: 148 lb 1.6 oz (67.178 kg)  SpO2: 100%     Alert, cooperative, no distress, appears stated age  Head:    Normocephalic, without obvious abnormality, atraumatic  Eyes:    PERRL, conjunctiva/corneas clear, EOM's intact, fundi    benign, both eyes       Neck:   Supple, symmetrical, trachea midline, no adenopathy;       thyroid:  No enlargement/tenderness/nodules; no carotid   bruit or JVD  Lungs:     Clear to auscultation bilaterally, respirations unlabored  Chest wall:    No tenderness or deformity  Heart:    Regular rate and rhythm, S1 and S2 normal, no murmur, rub   or gallop  Abdomen:     Soft, non-tender, bowel sounds active all four quadrants,    no masses, no organomegaly  Extremities:   Extremities normal, atraumatic, no cyanosis or edema  Pulses:   2+ and symmetric all extremities  Skin:   Skin color, texture, turgor normal, no rashes or lesions  Neurologic:  nonfocal grossly Back: symmetrical, no bony abnormality, SLR mildy positive, cervical and lumbar pain on deep palpation   ROS As per HPI

## 2011-08-16 ENCOUNTER — Telehealth: Payer: Self-pay | Admitting: *Deleted

## 2011-08-16 NOTE — Telephone Encounter (Signed)
Could you please call pt to discuss test results, please call [phone]

## 2011-08-17 NOTE — Telephone Encounter (Signed)
Done

## 2012-02-12 ENCOUNTER — Encounter: Payer: Self-pay | Admitting: Internal Medicine

## 2012-06-16 ENCOUNTER — Other Ambulatory Visit: Payer: Self-pay | Admitting: Radiation Oncology

## 2012-06-16 ENCOUNTER — Encounter: Payer: Self-pay | Admitting: Radiation Oncology

## 2012-06-16 DIAGNOSIS — Z5329 Procedure and treatment not carried out because of patient's decision for other reasons: Secondary | ICD-10-CM | POA: Insufficient documentation

## 2012-07-02 HISTORY — PX: CARDIAC CATHETERIZATION: SHX172

## 2012-11-10 ENCOUNTER — Ambulatory Visit: Payer: Self-pay | Admitting: Internal Medicine

## 2012-11-21 ENCOUNTER — Ambulatory Visit: Payer: Self-pay | Admitting: Internal Medicine

## 2012-12-03 ENCOUNTER — Ambulatory Visit: Payer: Self-pay | Admitting: Internal Medicine

## 2012-12-09 ENCOUNTER — Ambulatory Visit: Payer: Self-pay | Admitting: Internal Medicine

## 2012-12-17 ENCOUNTER — Ambulatory Visit (INDEPENDENT_AMBULATORY_CARE_PROVIDER_SITE_OTHER): Payer: Self-pay | Admitting: Internal Medicine

## 2012-12-17 ENCOUNTER — Encounter: Payer: Self-pay | Admitting: Internal Medicine

## 2012-12-17 VITALS — BP 108/71 | HR 48 | Temp 98.5°F | Ht 66.0 in | Wt 131.6 lb

## 2012-12-17 DIAGNOSIS — F17200 Nicotine dependence, unspecified, uncomplicated: Secondary | ICD-10-CM

## 2012-12-17 DIAGNOSIS — Z720 Tobacco use: Secondary | ICD-10-CM

## 2012-12-17 DIAGNOSIS — Z0289 Encounter for other administrative examinations: Secondary | ICD-10-CM

## 2012-12-17 DIAGNOSIS — Z0000 Encounter for general adult medical examination without abnormal findings: Secondary | ICD-10-CM | POA: Insufficient documentation

## 2012-12-17 NOTE — Assessment & Plan Note (Signed)
-  checking cmp, cbc today -ASCVD risk is 5.6%.  Will plan to recheck lipid panel every 3-5 years

## 2012-12-17 NOTE — Patient Instructions (Signed)
General Instructions: The best thing you can do for your health is to quit smoking.  Please review the literature we've provided today.  I'm happy to assist you in any way I can towards your goal of quitting smoking.  We will fill out your [facility] paperwork, and mail it in by the end of the week. -we are checking some routine labs today.  Please return for a follow-up visit in 6 months.   Treatment Goals:  Goals (1 Years of Data) as of 12/17/12   None      Progress Toward Treatment Goals:  Treatment Goal 12/17/2012  Stop smoking smoking the same amount    Self Care Goals & Plans:  Self Care Goal 12/17/2012  Monitor my health bring my glucose meter and log to each visit  Eat healthy foods eat foods that are low in salt; drink diet soda or water instead of juice or soda  Be physically active take a walk every day  Stop smoking call [facility] (1-800-QUIT-NOW); go to the [employer] ([employer])       Care Management & Community Referrals:  Referral 12/17/2012  Referrals made for care management support none needed

## 2012-12-17 NOTE — Progress Notes (Signed)
HPI The patient is a 47 y.o. male with a history of depression, tobacco abuse, polysubstance abuse, presenting for a routine follow-up visit, and to fill out DMV paperwork.  The patient has a history of alcohol abuse.  He went to [facility] at [facility] in 2006.  The patient's last drink of alcohol was 5 days ago.  He states he drinks 2 40-oz beers once or twice per week (on weekends).  CAGE questions were negative x4.  The patient states that his drinking is now under control, and that he stops drinking if he starts to feel himself getting "drunk".  The patient has a history of cocaine use, last used 2 months ago.  The patient has a history of marijuana use, approximated at once per week.  The patient was previously homeless.  He is now living with his family.  His last incarceration was in 2013 (identity theft, shoplifting, resisting arrest).  The patient smokes tobacco, currently smoking 1/2 ppd since age 86.  He is interested in quitting, but wants to first cut back on his own, then try e-cigarettes.  He notes no vision problems.  He notes no heart problems.  ROS: General: no fevers, chills, changes in weight, changes in appetite Skin: no rash HEENT: no blurry vision, hearing changes, sore throat Pulm: no dyspnea, coughing, wheezing CV: no chest pain, palpitations, shortness of breath Abd: no abdominal pain, nausea/vomiting, diarrhea/constipation GU: no dysuria, hematuria, polyuria Ext: no arthralgias, myalgias Neuro: no weakness, numbness, or tingling  Filed Vitals:   12/17/12 1442  BP: 108/71  Pulse: 48  Temp: 98.5 F (36.9 C)    PEX  General: alert, cooperative, and in no apparent distress HEENT: pupils equal round and reactive to light, vision 20/25 in both eyes, oropharynx clear and non-erythematous  Neck: supple, no lymphadenopathy Lungs: clear to ascultation bilaterally, normal work of respiration, no wheezes, rales, ronchi Heart: regular rate and rhythm, no murmurs, gallops,  or rubs Abdomen: soft, non-tender, non-distended, normal bowel sounds Extremities: no cyanosis, clubbing, or edema Neurologic: alert & oriented X3, cranial nerves II-XII intact, strength 5/5 throughout, sensation intact to light touch  No current outpatient prescriptions on file prior to visit.   No current facility-administered medications on file prior to visit.    Assessment/Plan  DMV paperwork filled out.  We will scan this into the chart for our record, and forward this to the [place].

## 2012-12-17 NOTE — Assessment & Plan Note (Signed)
  Assessment: Progress toward smoking cessation:  smoking the same amount Barriers to progress toward smoking cessation:  withdrawal symptoms Comments: Patient wants to try cutting back on his own, then trying e-cigs.  We discussed smoking cessation today.  Plan: Instruction/counseling given:  I counseled patient on the dangers of tobacco use, advised patient to stop smoking, and reviewed strategies to maximize success. Educational resources provided:    Self management tools provided:  smoking cessation plan (STAR Quit Plan) Medications to assist with smoking cessation:  None Patient agreed to the following self-care plans for smoking cessation: call [facility] (1-800-QUIT-NOW);go to the [employer] ([employer])  Other plans: Re-assess at next visit

## 2012-12-18 LAB — COMPLETE METABOLIC PANEL WITH GFR
ALT: 19 U/L (ref 0–53)
AST: 26 U/L (ref 0–37)
Albumin: 4.2 g/dL (ref 3.5–5.2)
Alkaline Phosphatase: 42 U/L (ref 39–117)
CO2: 29 mEq/L (ref 19–32)
Chloride: 105 mEq/L (ref 96–112)
Creat: 1.17 mg/dL (ref 0.50–1.35)
GFR, Est African American: 85 mL/min
GFR, Est Non African American: 74 mL/min
Potassium: 4.3 mEq/L (ref 3.5–5.3)
Total Bilirubin: 0.5 mg/dL (ref 0.3–1.2)

## 2012-12-18 LAB — CBC
MCH: 29.5 pg (ref 26.0–34.0)
MCHC: 33.4 g/dL (ref 30.0–36.0)
MCV: 88.2 fL (ref 78.0–100.0)
Platelets: 208 10*3/uL (ref 150–400)
RBC: 4.51 MIL/uL (ref 4.22–5.81)
RDW: 15.2 % (ref 11.5–15.5)
WBC: 7 10*3/uL (ref 4.0–10.5)

## 2012-12-18 NOTE — Progress Notes (Signed)
Case discussed with Dr. Brown immediately after the resident saw the patient. We reviewed the resident's history and exam and pertinent patient test results. I agree with the assessment, diagnosis and plan of care documented in the resident's note. 

## 2012-12-22 ENCOUNTER — Telehealth: Payer: Self-pay | Admitting: *Deleted

## 2012-12-22 NOTE — Telephone Encounter (Signed)
Pt stopped by [facility] and states most of 12/20/12 - heart rate very slow. Hx of heart rate about 28. Checked while talking to pt heart rate 54. Appt made for this week and suggest if heart rate drops - to go to ER. Working on [employer]. Stanton Kidney Ioan Landini RN 12/22/12 10:30AM

## 2012-12-24 ENCOUNTER — Ambulatory Visit (HOSPITAL_COMMUNITY)
Admission: RE | Admit: 2012-12-24 | Discharge: 2012-12-24 | Disposition: A | Discharge status: Home / Self Care | Payer: Self-pay | Source: Ambulatory Visit | Attending: Internal Medicine | Admitting: Internal Medicine

## 2012-12-24 ENCOUNTER — Encounter: Payer: Self-pay | Admitting: Internal Medicine

## 2012-12-24 ENCOUNTER — Ambulatory Visit (INDEPENDENT_AMBULATORY_CARE_PROVIDER_SITE_OTHER): Payer: Self-pay | Admitting: Internal Medicine

## 2012-12-24 VITALS — BP 110/70 | HR 50 | Temp 98.8°F | Ht 66.0 in | Wt 132.0 lb

## 2012-12-24 DIAGNOSIS — R5383 Other fatigue: Secondary | ICD-10-CM

## 2012-12-24 DIAGNOSIS — R001 Bradycardia, unspecified: Secondary | ICD-10-CM

## 2012-12-24 DIAGNOSIS — R5381 Other malaise: Secondary | ICD-10-CM

## 2012-12-24 DIAGNOSIS — R9431 Abnormal electrocardiogram [ECG] [EKG]: Secondary | ICD-10-CM | POA: Insufficient documentation

## 2012-12-24 DIAGNOSIS — I498 Other specified cardiac arrhythmias: Secondary | ICD-10-CM

## 2012-12-24 NOTE — Patient Instructions (Signed)
General Instructions: Your EKG showed a slow heart rate, without any other abnormalities.  We believe you need to be further evaluated by Cardiology for this matter. -we are sending a referral to cardiology.  You must have an [employer] before you will be seen by Cardiology.  For your low energy, we are also checking a thyroid level today.  Please return for a follow-up visit in about 1 month.   Treatment Goals:  Goals (1 Years of Data) as of 12/24/12   None      Progress Toward Treatment Goals:  Treatment Goal 12/24/2012  Stop smoking smoking the same amount    Self Care Goals & Plans:  Self Care Goal 12/24/2012  Monitor my health -  Eat healthy foods drink diet soda or water instead of juice or soda; eat baked foods instead of fried foods  Be physically active take a walk every day  Stop smoking (No Data)       Care Management & Community Referrals:  Referral 12/24/2012  Referrals made for care management support none needed

## 2012-12-24 NOTE — Progress Notes (Signed)
HPI The patient is a 47 y.o. male with a history of alcohol abuse, cocaine abuse, tobacco abuse, presenting for an acute visit for fatigue.  The patient presents with a 2-year history of feelings of fatigue, feeling like he has "no energy", as well as palpitations of felling his heart beat "slowly" during these times..  He notes that his lack of energy is impairing his ability to do his daily tasks.  To alleviate symptoms, he tries moving around to raise his heart rate, which improves symptoms.  The patient notes no diarrhea/constipation, difficulty concentrating, appetite changes, weight changes, heat/cold intolerance.  He has a paternal grandmother with heart failure, and a brother with an MI in the 14's.  The patient has no family history of thyroid problems.  No family history of sudden death.  The patient notes that when he was young, his doctor told him he had an irregular heart beat.  ROS: General: no fevers, chills, changes in weight, changes in appetite Skin: no rash HEENT: no blurry vision, hearing changes, sore throat Pulm: no dyspnea, coughing, wheezing CV: no chest pain, palpitations, shortness of breath Abd: no abdominal pain, nausea/vomiting, diarrhea/constipation GU: no dysuria, hematuria, polyuria Ext: no arthralgias, myalgias Neuro: no weakness, numbness, or tingling  Filed Vitals:   12/24/12 1422  BP: 110/70  Pulse: 50  Temp: 98.8 F (37.1 C)    EKG: Sinus bradycardia, normal PR interval, no skipped beats, poor R wave progression, possible q waves in V1, V2  PEX General: alert, cooperative, and in no apparent distress HEENT: pupils equal round and reactive to light, vision grossly intact, oropharynx clear and non-erythematous  Neck: supple, no lymphadenopathy Lungs: clear to ascultation bilaterally, normal work of respiration, no wheezes, rales, ronchi Heart: bradycardia, regular rhythm, no murmurs, gallops, or rubs Abdomen: soft, non-tender, non-distended, normal  bowel sounds Extremities: no cyanosis, clubbing, or edema Neurologic: alert & oriented X3, cranial nerves II-XII intact, strength grossly intact, sensation intact to light touch  No current outpatient prescriptions on file prior to visit.   No current facility-administered medications on file prior to visit.    Assessment/Plan

## 2012-12-24 NOTE — Assessment & Plan Note (Addendum)
The patient presents with sinus bradycardia, with symptoms of fatigue, present for 2 years.  Chart review reveals that the patient has been bradycardic since 2012, which is as far back as we have records for this patient.  EKG shows sinus bradycardia, and a normal PR interval, though perhaps some anterior Q waves and poor R wave progression.  The patient is taking no medications.  The etiology of this is unclear, but ischemic heart disease is a possibility given EKG changes. -will refer to cardiology for symptomatic bradycardia -check TSH today

## 2012-12-24 NOTE — Progress Notes (Signed)
Case discussed with Dr. Brown at the time of the visit.  We reviewed the resident's history and exam and pertinent patient test results.  I agree with the assessment, diagnosis, and plan of care documented in the resident's note. 

## 2013-01-06 ENCOUNTER — Ambulatory Visit: Payer: Self-pay

## 2013-01-08 ENCOUNTER — Telehealth: Payer: Self-pay | Admitting: *Deleted

## 2013-01-08 NOTE — Telephone Encounter (Signed)
Pt aware results TSH WNL per Dr Manson Passey. 01/01/13 4:30PM

## 2013-01-29 ENCOUNTER — Encounter: Payer: Self-pay | Admitting: Internal Medicine

## 2013-01-29 ENCOUNTER — Ambulatory Visit (INDEPENDENT_AMBULATORY_CARE_PROVIDER_SITE_OTHER): Payer: Self-pay | Admitting: Internal Medicine

## 2013-01-29 VITALS — BP 112/69 | HR 50 | Temp 97.4°F | Ht 66.0 in | Wt 130.1 lb

## 2013-01-29 DIAGNOSIS — I498 Other specified cardiac arrhythmias: Secondary | ICD-10-CM

## 2013-01-29 DIAGNOSIS — R001 Bradycardia, unspecified: Secondary | ICD-10-CM

## 2013-01-29 NOTE — Progress Notes (Signed)
Case discussed with Dr. Brown soon after the resident saw the patient.  We reviewed the resident's history and exam and pertinent patient test results.  I agree with the assessment, diagnosis, and plan of care documented in the resident's note. 

## 2013-01-29 NOTE — Assessment & Plan Note (Signed)
The patient continues to note sinus bradycardia, which he associates with low energy.  The patient continues to appear anxious about his bradycardia.  No evidence for hypothyroidism, no medications that could slow HR.  No evidence for hypoxia, infection, hypokalemia, hypothermia.  This may represent normal variant, but given that patient believes he is symptomatic, I'd like to rule out ischemic heart disease as a potential cause (given Q waves on EKG).  I'm unsure if his fatigue is actually related to his bradycardia, but no evidence for hypothyroidism, anemia, kidney injury, hepatic injury. -pt to receive [employer] -will refer to cardiology for further work-up - consider echo vs ?cath.

## 2013-01-29 NOTE — Progress Notes (Signed)
HPI The patient is a 47 y.o. male with a history of bradycardia, tobacco use, presenting for a follow-up visit.  The patient has a history of bradycardia, see note from 12/24/12 for full details.  In brief, the patient notes a 2-year history of fatigue, which he attributes to bradycardia, and seems to improve when he raises his heart rate (ie through exercise).  EKG at his last visit demonstrated sinus bradycardia, with possible q waves in the anterior leads.  TSH was within normal limits.  CBC and CMP also unremarkable.  The patient continues to note the same symptoms, unchanged.  The plan was to refer the patient to cardiology, for further evaluation for possible ischemic heart disease contributing to bradycardia once the patient received the [facility].  ROS: General: no fevers, chills, changes in weight, changes in appetite Skin: no rash HEENT: no blurry vision, hearing changes, sore throat Pulm: no dyspnea, coughing, wheezing CV: no chest pain, shortness of breath Abd: no abdominal pain, nausea/vomiting, diarrhea/constipation GU: no dysuria, hematuria, polyuria Ext: no arthralgias, myalgias Neuro: no weakness, numbness, or tingling  Filed Vitals:   01/29/13 1601  BP: 112/69  Pulse: 50  Temp: 97.4 F (36.3 C)    PEX General: alert, cooperative, and in no apparent distress HEENT: pupils equal round and reactive to light, vision grossly intact, oropharynx clear and non-erythematous  Neck: supple, no lymphadenopathy Lungs: clear to ascultation bilaterally, normal work of respiration, no wheezes, rales, ronchi Heart: bradycardic, regular rhythm, no murmurs, gallops, or rubs Abdomen: soft, non-tender, non-distended, normal bowel sounds Extremities: no cyanosis, clubbing, or edema Neurologic: alert & oriented X3, cranial nerves II-XII intact, strength grossly intact, sensation intact to light touch  No current outpatient prescriptions on file prior to visit.   No current  facility-administered medications on file prior to visit.    Assessment/Plan

## 2013-01-29 NOTE — Patient Instructions (Signed)
General Instructions: We have received your information for the [facility].  Rudell Cobb will return on Monday, and will contact you about getting you the [facility].  Once you've received this, we will be able to make you a referral to Cardiology for further evaluation.  Please return for a follow-up visit in 3 months.   Treatment Goals:  Goals (1 Years of Data) as of 01/29/13   None      Progress Toward Treatment Goals:  Treatment Goal 01/29/2013  Stop smoking smoking the same amount    Self Care Goals & Plans:  Self Care Goal 01/29/2013  Monitor my health -  Eat healthy foods drink diet soda or water instead of juice or soda; eat baked foods instead of fried foods  Be physically active take a walk every day  Stop smoking (No Data)       Care Management & Community Referrals:  Referral 01/29/2013  Referrals made for care management support none needed

## 2013-02-04 ENCOUNTER — Ambulatory Visit: Payer: Self-pay

## 2013-02-06 NOTE — Addendum Note (Signed)
Addended by: Linward Headland on: 02/06/2013 02:41 PM   Modules accepted: Orders

## 2013-03-03 ENCOUNTER — Ambulatory Visit (HOSPITAL_COMMUNITY)
Admission: RE | Admit: 2013-03-03 | Discharge: 2013-03-03 | Disposition: A | Discharge status: Home / Self Care | Payer: No Typology Code available for payment source | Source: Ambulatory Visit | Attending: Internal Medicine | Admitting: Internal Medicine

## 2013-03-03 ENCOUNTER — Telehealth: Payer: Self-pay | Admitting: *Deleted

## 2013-03-03 ENCOUNTER — Ambulatory Visit (INDEPENDENT_AMBULATORY_CARE_PROVIDER_SITE_OTHER): Payer: No Typology Code available for payment source | Admitting: Internal Medicine

## 2013-03-03 ENCOUNTER — Encounter: Payer: Self-pay | Admitting: Internal Medicine

## 2013-03-03 VITALS — BP 108/67 | HR 49 | Temp 97.2°F | Wt 128.7 lb

## 2013-03-03 DIAGNOSIS — I498 Other specified cardiac arrhythmias: Secondary | ICD-10-CM | POA: Insufficient documentation

## 2013-03-03 DIAGNOSIS — Z720 Tobacco use: Secondary | ICD-10-CM

## 2013-03-03 DIAGNOSIS — Z23 Encounter for immunization: Secondary | ICD-10-CM

## 2013-03-03 DIAGNOSIS — Z0000 Encounter for general adult medical examination without abnormal findings: Secondary | ICD-10-CM

## 2013-03-03 DIAGNOSIS — R9431 Abnormal electrocardiogram [ECG] [EKG]: Secondary | ICD-10-CM | POA: Insufficient documentation

## 2013-03-03 DIAGNOSIS — R001 Bradycardia, unspecified: Secondary | ICD-10-CM

## 2013-03-03 DIAGNOSIS — F17200 Nicotine dependence, unspecified, uncomplicated: Secondary | ICD-10-CM

## 2013-03-03 LAB — BASIC METABOLIC PANEL
Chloride: 101 mEq/L (ref 96–112)
Glucose, Bld: 100 mg/dL — ABNORMAL HIGH (ref 70–99)
Potassium: 3.8 mEq/L (ref 3.5–5.3)
Sodium: 137 mEq/L (ref 135–145)

## 2013-03-03 NOTE — Progress Notes (Signed)
  Subjective:    Patient ID: William Wood, male    DOB: 01/28/1966, 47 y.o.   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  He presents today for fatigue which he thinks is related to his bradycardia which he has experienced all week.  He thought he had a hangover and woke up "dragging" and "tired" but realized he only drank a 40 oz beer yesterday and smoked marijuana but denies cocaine use recently.  He woke up around 7:30 am feeling weak he drain water and went to bed.  He denies chest pain, presyncope/syncope, palpitations.  He states he felt sob, lightheaded this am with the low HR and he feels drowsy.  He also admits to indigestion, GERD. He states as a kid he was unable to play sports due to irregular HR and possibly low HR.    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  Gastrointestinal: Negative for blood in stool.  Genitourinary: Negative for hematuria.  Neurological: Positive for weakness and light-headedness. Negative for syncope.       Objective:   Physical Exam  Nursing note and vitals reviewed. Constitutional: He is oriented to person, place, and time. He appears well-developed and well-nourished. He is cooperative.  HENT:  Head: Normocephalic and atraumatic.  Mouth/Throat: Abnormal dentition.  Eyes: Conjunctivae are normal. Right eye exhibits no discharge. Left eye exhibits no discharge. No scleral icterus.  Cardiovascular: Regular rhythm and normal heart sounds.  Bradycardia present.   No murmur heard. Pulmonary/Chest: Effort normal and breath sounds normal.   Musculoskeletal: He exhibits no edema.  Neurological: He is alert and oriented to person, place, and time. Gait normal.  Skin: Skin is warm, dry and intact. No rash noted.  Psychiatric: He has a normal mood and affect. His speech is normal and behavior is normal. Judgment and thought content normal. Cognition and memory are normal.          Assessment & Plan:  F/u with [facility] cardiology 03/13/13 at 10 am

## 2013-03-03 NOTE — Patient Instructions (Addendum)
General Instructions: Follow up with [facility] Cardiology 03/13/13 at 10 am call for questions [phone] Review the information below  Follow up in 3 months sooner if needed.   Treatment Goals:  Goals (1 Years of Data) as of 03/03/13   None      Progress Toward Treatment Goals:  Treatment Goal 03/03/2013  Stop smoking smoking less    Self Care Goals & Plans:  Self Care Goal 03/03/2013  Monitor my health keep track of my blood pressure  Eat healthy foods eat foods that are low in salt; eat baked foods instead of fried foods; drink diet soda or water instead of juice or soda; eat more vegetables; eat fruit for snacks and desserts; eat smaller portions  Be physically active take a walk every day; park at the far end of the parking lot; find an activity I enjoy  Stop smoking cut down the number of cigarettes smoked; go to the [employer] ([employer])  Meeting treatment goals maintain the current self-care plan       Care Management & Community Referrals:  Referral 03/03/2013  Referrals made for care management support none needed  Referrals made to community resources none       Bradycardia Bradycardia is a term for a heart rate (pulse) that, in adults, is slower than 60 beats per minute. A normal rate is 60 to 100 beats per minute. A heart rate below 60 beats per minute may be normal for some adults with healthy hearts. If the rate is too slow, the heart may have trouble pumping the volume of blood the body needs. If the heart rate gets too low, blood flow to the brain may be decreased and may make you feel lightheaded, dizzy, or faint. The heart has a natural pacemaker in the top of the heart called the SA node (sinoatrial or sinus node). This pacemaker sends out regular electrical signals to the muscle of the heart, telling the heart muscle when to beat (contract). The electrical signal travels from the upper parts of the heart (atria) through the AV node (atrioventricular  node), to the lower chambers of the heart (ventricles). The ventricles squeeze, pumping the blood from your heart to your lungs and to the rest of your body. CAUSES   Problem with the heart's electrical system.  Problem with the heart's natural pacemaker.  Heart disease, damage, or infection.  Medications.  Problems with minerals and salts (electrolytes). SYMPTOMS   Fainting (syncope).  Fatigue and weakness.  Shortness of breath (dyspnea).  Chest pain (angina).  Drowsiness.  Confusion. DIAGNOSIS   An electrocardiogram (ECG) can help your caregiver determine the type of slow heart rate you have.  If the cause is not seen on an ECG, you may need to wear a heart monitor that records your heart rhythm for several hours or days.  Blood tests. TREATMENT   Electrolyte supplements.  Medications.  Withholding medication which is causing a slow heart rate.  Pacemaker placement. SEEK IMMEDIATE MEDICAL CARE IF:   You feel lightheaded or faint.  You develop an irregular heart rate.  You feel chest pain or have trouble breathing. MAKE SURE YOU:   Understand these instructions.  Will watch your condition.  Will get help right away if you are not doing well or get worse. Document Released: 03/10/2002 Document Revised: 09/10/2011 Document Reviewed: 02/04/2008 [facility] Patient Information 2014 [place], [place].  Marijuana Abuse Your exam shows you have used marijuana or pot. There are many health problems related to marijuana  abuse. These include:  Bronchitis.  Chronic cough.  Emphysema.  Lung and upper airway cancer. Abusers also experience impairment in:  Memory.  Judgment.  Ability to learn.  Coordination. Students who smoke marijuana:  Get lower grades.  Are less likely to graduate than those who do not. Adults who abuse marijuana:  Have problems at work.  May even lose their jobs due to:  Poor work International aid/development worker.  Absenteeism. Attention,  memory, and learning skills have been shown to be diminished for up to 6 months after stopping regular use, and there is evidence that the effects can be cumulative over a lifetime.  Heavier use of marijuana also puts a strain on relationships with friends and loved ones and can lead to moodiness and loss of confidence. Acute intoxication can lead to:  Increased anxiety.  A panic episode. It also increases the risk for having an automobile accident. This is especially true if the pot is combined with alcohol or other intoxicants. Treatment for acute intoxication is rarely needed. However, medicine to reduce anxiety may be helpful in some people. Millions of people are considered to be dependent on marijuana. It is long-term regular use that leads to addiction and all of its complex problems. Information on the problem of addiction and the health problems of long-term abuse is posted at the [facility] for Drug Abuse website, http://www.price-smith.com/. Consult with your doctor or counselor if you want further information and support in handling this common problem. Document Released: 07/26/2004 Document Revised: 09/10/2011 Document Reviewed: 05/13/2007 [facility] Patient Information 2014 [place], [place].  Smoking Cessation Quitting smoking is important to your health and has many advantages. However, it is not always easy to quit since nicotine is a very addictive drug. Often times, people try 3 times or more before being able to quit. This document explains the best ways for you to prepare to quit smoking. Quitting takes hard work and a lot of effort, but you can do it. ADVANTAGES OF QUITTING SMOKING  You will live longer, feel better, and live better.  Your body will feel the impact of quitting smoking almost immediately.  Within 20 minutes, blood pressure decreases. Your pulse returns to its normal level.  After 8 hours, carbon monoxide levels in the blood return to normal. Your oxygen level  increases.  After 24 hours, the chance of having a heart attack starts to decrease. Your breath, hair, and body stop smelling like smoke.  After 48 hours, damaged nerve endings begin to recover. Your sense of taste and smell improve.  After 72 hours, the body is virtually free of nicotine. Your bronchial tubes relax and breathing becomes easier.  After 2 to 12 weeks, lungs can hold more air. Exercise becomes easier and circulation improves.  The risk of having a heart attack, stroke, cancer, or lung disease is greatly reduced.  After 1 year, the risk of coronary heart disease is cut in half.  After 5 years, the risk of stroke falls to the same as a nonsmoker.  After 10 years, the risk of lung cancer is cut in half and the risk of other cancers decreases significantly.  After 15 years, the risk of coronary heart disease drops, usually to the level of a nonsmoker.  If you are pregnant, quitting smoking will improve your chances of having a healthy baby.  The people you live with, especially any children, will be healthier.  You will have extra money to spend on things other than cigarettes. QUESTIONS TO THINK ABOUT  BEFORE ATTEMPTING TO QUIT You may want to talk about your answers with your caregiver.  Why do you want to quit?  If you tried to quit in the past, what helped and what did not?  What will be the most difficult situations for you after you quit? How will you plan to handle them?  Who can help you through the tough times? Your family? Friends? A caregiver?  What pleasures do you get from smoking? What ways can you still get pleasure if you quit? Here are some questions to ask your caregiver:  How can you help me to be successful at quitting?  What medicine do you think would be best for me and how should I take it?  What should I do if I need more help?  What is smoking withdrawal like? How can I get information on withdrawal? GET READY  Set a quit  date.  Change your environment by getting rid of all cigarettes, ashtrays, matches, and lighters in your home, car, or work. Do not let people smoke in your home.  Review your past attempts to quit. Think about what worked and what did not. GET SUPPORT AND ENCOURAGEMENT You have a better chance of being successful if you have help. You can get support in many ways.  Tell your family, friends, and co-workers that you are going to quit and need their support. Ask them not to smoke around you.  Get individual, group, or telephone counseling and support. Programs are available at [employer] and health centers. Call your local health department for information about programs in your area.  Spiritual beliefs and practices may help some smokers quit.  Download a "quit meter" on your computer to keep track of quit statistics, such as how long you have gone without smoking, cigarettes not smoked, and money saved.  Get a self-help book about quitting smoking and staying off of tobacco. LEARN NEW SKILLS AND BEHAVIORS  Distract yourself from urges to smoke. Talk to someone, go for a walk, or occupy your time with a task.  Change your normal routine. Take a different route to work. Drink tea instead of coffee. Eat breakfast in a different place.  Reduce your stress. Take a hot bath, exercise, or read a book.  Plan something enjoyable to do every day. Reward yourself for not smoking.  Explore interactive web-based programs that specialize in helping you quit. GET MEDICINE AND USE IT CORRECTLY Medicines can help you stop smoking and decrease the urge to smoke. Combining medicine with the above behavioral methods and support can greatly increase your chances of successfully quitting smoking.  Nicotine replacement therapy helps deliver nicotine to your body without the negative effects and risks of smoking. Nicotine replacement therapy includes nicotine gum, lozenges, inhalers, nasal sprays, and  skin patches. Some may be available over-the-counter and others require a prescription.  Antidepressant medicine helps people abstain from smoking, but how this works is unknown. This medicine is available by prescription.  Nicotinic receptor partial agonist medicine simulates the effect of nicotine in your brain. This medicine is available by prescription. Ask your caregiver for advice about which medicines to use and how to use them based on your health history. Your caregiver will tell you what side effects to look out for if you choose to be on a medicine or therapy. Carefully read the information on the package. Do not use any other product containing nicotine while using a nicotine replacement product.  RELAPSE OR DIFFICULT SITUATIONS Most  relapses occur within the first 3 months after quitting. Do not be discouraged if you start smoking again. Remember, most people try several times before finally quitting. You may have symptoms of withdrawal because your body is used to nicotine. You may crave cigarettes, be irritable, feel very hungry, cough often, get headaches, or have difficulty concentrating. The withdrawal symptoms are only temporary. They are strongest when you first quit, but they will go away within 10 14 days. To reduce the chances of relapse, try to:  Avoid drinking alcohol. Drinking lowers your chances of successfully quitting.  Reduce the amount of caffeine you consume. Once you quit smoking, the amount of caffeine in your body increases and can give you symptoms, such as a rapid heartbeat, sweating, and anxiety.  Avoid smokers because they can make you want to smoke.  Do not let weight gain distract you. Many smokers will gain weight when they quit, usually less than 10 pounds. Eat a healthy diet and stay active. You can always lose the weight gained after you quit.  Find ways to improve your mood other than smoking. FOR MORE INFORMATION  www.smokefree.gov  Document  Released: 06/12/2001 Document Revised: 12/18/2011 Document Reviewed: 09/27/2011 [facility] Patient Information 2014 [place], [place].  Chemical Dependency Chemical dependency is an addiction to drugs or alcohol. It is characterized by the repeated behavior of seeking out and using drugs and alcohol despite harmful consequences to the health and safety of ones self and others.  RISK FACTORS There are certain situations or behaviors that increase a person's risk for chemical dependency. These include:  A family history of chemical dependency.  A history of mental health issues, including depression and anxiety.  A home environment where drugs and alcohol are easily available to you.  Drug or alcohol use at a young age. SYMPTOMS  The following symptoms can indicate chemical dependency:  Inability to limit the use of drugs or alcohol.  Nausea, sweating, shakiness, and anxiety that occurs when alcohol or drugs are not being used.  An increase in amount of drugs or alcohol that is necessary to get drunk or high. People who experience these symptoms can assess their use of drugs and alcohol by asking themselves the following questions:  Have you been told by friends or family that they are worried about your use of alcohol or drugs?  Do friends and family ever tell you about things you did while drinking alcohol or using drugs that you do not remember?  Do you lie about using alcohol or drugs or about the amounts you use?  Do you have difficulty completing daily tasks unless you use alcohol or drugs?  Is the level of your work or school performance lower because of your drug or alcohol use?  Do you get sick from using drugs or alcohol but keep using anyway?  Do you feel uncomfortable in social situations unless you use alcohol or drugs?  Do you use drugs or alcohol to help forget problems? An answer of yes to any of these questions may indicate chemical dependency. Professional  evaluation is suggested. Document Released: 06/12/2001 Document Revised: 09/10/2011 Document Reviewed: 08/24/2010 [facility] Patient Information 2014 [place], [place].  Cocaine Abuse PROBLEMS FROM USING COCAINE:   Highly addictive.  Illegal.  Risk of sudden death.  Heart disease.  Irregular heart beat.  High blood pressure.  Damage to nose and lungs.  Severe agitation.  Hallucinations.  Violent behavior.  Paranoia.  Sexual dysfunction. Most cocaine users deny that they have a  problem with addiction. The biggest problem is admitting that you are dependent on cocaine. Those trying to quit using it may experience depression and withdrawal symptoms. Other withdrawal symptoms include fatigue, suicidal thoughts, sleepiness, restlessness, anxiety, and increased craving for cocaine. There are medications available to help prevent depression associated with stopping cocaine. Most users will find a support group or treatment program helpful in coming off and staying off cocaine. The best chance to cure cocaine addiction is to go into group therapy and to be in a drug-free environment. It is very important to develop healthy relationships and avoid socializing with people who use or deal drugs. Eat well, and give your body the proper rest and healthy exercise it needs. You may need medication to help treat withdrawal symptoms. Call your caregiver or a drug treatment center for more help.  You may also want to call the [facility] on Drug Abuse at 800-662-HELP in the [place] SEEK IMMEDIATE MEDICAL CARE IF:  You develop severe chest pain.  You develop shortness of breath.  You develop extreme agitation. Document Released: 07/26/2004 Document Revised: 09/10/2011 Document Reviewed: 04/20/2009 [facility] Patient Information 2014 [place], [place].

## 2013-03-03 NOTE — Telephone Encounter (Signed)
Pt called asking for a return call.  I called back at the # given and was not able to find the pt. Pt called again, returned call and asked to talk with the RN on duty at [facility]  Nurse states pt is concerned because his heart rate is 45, blood pressure is low and he feels weak. Pt offered an appointment in clinic today but he wants to be seen in ED. Noted at last 2 visits in clinic Heart Rate was 48 and 50.  He has a scheduled appointment with Cards on 10/8.

## 2013-03-03 NOTE — Assessment & Plan Note (Signed)
  Assessment: Progress toward smoking cessation:  smoking less Barriers to progress toward smoking cessation:  lack of motivation to quit Comments: n/a  Plan: Instruction/counseling given:  I counseled patient on the dangers of tobacco use, advised patient to stop smoking, and reviewed strategies to maximize success. Educational resources provided:  [employer]) brochure Self management tools provided:  smoking cessation plan (STAR Quit Plan) Medications to assist with smoking cessation:  None Patient agreed to the following self-care plans for smoking cessation: cut down the number of cigarettes smoked;go to the [employer] website ([employer].ee)  Other plans: encouraged cessation of tobacco and marijuana, info given

## 2013-03-03 NOTE — Assessment & Plan Note (Signed)
Flu vx given today

## 2013-03-03 NOTE — Assessment & Plan Note (Addendum)
This has been noted since 2012 at least.  Last visit 11/2012 did not show a cause as far as medications, tsh normal, no anemia, lfts normal.  He does have a h/o of Q waves noted on EKG and poor R wave progression.  Ddx is not related to meds, tsh, anemia.  He does have a history of drug abuse which could have cause cardiac damage in the past  Will repeat EKG today (showed SB HR 50, nl axis and intervals, ?LVH, T wave inversions in 3 and AVL old and AVF (new), old Q waves in V1, V2 and check BMET Pt has appt with Dr. Jens Som cardiology 04/08/13 for further w/u which I called and had moved to 03/13/13 at 10 am with [facility]. Consider echo with further w/u

## 2013-03-03 NOTE — Telephone Encounter (Signed)
I returned the patient's call, at the only number we have on file for him [phone]), but the patient's Aunt answered the phone, stating that the patient did not live at that location, and the Aunt had no other way to get in contact with him.  We have no other numbers on file for him.  The patient has been seen a few times for symptoms of generalized fatigue.  It is also noted that he has chronic bradycardia, which had no apparent EKG abnormalities seen in Clinic.  We have arranged for Cardiology follow-up to further address this non-emergent issue of chronic bradycardia.  The patient is convinced that his bradycardia is related to his fatigue, though I am not entirely convinced that this is the case.

## 2013-03-05 ENCOUNTER — Encounter: Payer: Self-pay | Admitting: *Deleted

## 2013-03-07 NOTE — Progress Notes (Signed)
Case discussed with Dr. McLean at the time of the visit.  We reviewed the resident's history and exam and pertinent patient test results.  I agree with the assessment, diagnosis, and plan of care documented in the resident's note.     

## 2013-03-13 ENCOUNTER — Encounter: Payer: Self-pay | Admitting: Cardiology

## 2013-03-13 ENCOUNTER — Ambulatory Visit (INDEPENDENT_AMBULATORY_CARE_PROVIDER_SITE_OTHER): Payer: No Typology Code available for payment source | Admitting: Cardiology

## 2013-03-13 VITALS — BP 110/70 | HR 50 | Ht 66.0 in | Wt 128.0 lb

## 2013-03-13 DIAGNOSIS — R079 Chest pain, unspecified: Secondary | ICD-10-CM

## 2013-03-13 NOTE — Patient Instructions (Addendum)
Schedule Echo   Schedule Treadmill    Follow instructions given    Your physician recommends that you schedule a follow-up appointment in: after test
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
## 2013-03-25 ENCOUNTER — Telehealth: Payer: Self-pay | Admitting: *Deleted

## 2013-03-25 NOTE — Telephone Encounter (Signed)
Pt would like for you to call him and discuss lab results. Call at [phone]. Sending to dr's mclean and brown

## 2013-03-26 ENCOUNTER — Encounter: Payer: Self-pay | Admitting: Internal Medicine

## 2013-03-30 NOTE — Telephone Encounter (Signed)
I returned the patient's call.  He wanted to know about the BMET drawn on 9/2.  I informed the patient that his Cr was at its baseline, which is slightly elevated, and that his electrolytes were wnl.  The patient has seen cardiology, and has an echo and stress test scheduled for 10/8 (per patient report).  Patient appreciative of call.

## 2013-04-02 ENCOUNTER — Telehealth: Payer: Self-pay | Admitting: Licensed Clinical Social Worker

## 2013-04-02 NOTE — Telephone Encounter (Signed)
CSW received several messages from Mr. Velie this morning regarding housing assistance.  Pt was under the impression, CSW had access to [facility] housing.  CSW informed Mr. Knoblock this worker can assist with referrals to agencies that specialize with homeless population.  Pt did not want transitional housing resources.  CSW informed pt wait list for [facility] is now open and pt should submit application.  Mr. Zietlow is a familiar with the [facility], but thought it was just for medical and orange card application.  CSW informed pt of homeless services [facility] offers.  Pt states he will check with [facility] for additional assistance with housing.

## 2013-04-08 ENCOUNTER — Encounter: Payer: No Typology Code available for payment source | Admitting: Physician Assistant

## 2013-04-08 ENCOUNTER — Encounter (INDEPENDENT_AMBULATORY_CARE_PROVIDER_SITE_OTHER): Payer: Self-pay

## 2013-04-08 ENCOUNTER — Ambulatory Visit: Payer: No Typology Code available for payment source | Admitting: Cardiology

## 2013-04-08 ENCOUNTER — Ambulatory Visit (HOSPITAL_COMMUNITY): Payer: No Typology Code available for payment source | Attending: Cardiology | Admitting: Radiology

## 2013-04-08 ENCOUNTER — Other Ambulatory Visit (HOSPITAL_COMMUNITY): Payer: Self-pay | Admitting: Cardiology

## 2013-04-08 DIAGNOSIS — F17200 Nicotine dependence, unspecified, uncomplicated: Secondary | ICD-10-CM | POA: Insufficient documentation

## 2013-04-08 DIAGNOSIS — R0789 Other chest pain: Secondary | ICD-10-CM | POA: Insufficient documentation

## 2013-04-08 DIAGNOSIS — R072 Precordial pain: Secondary | ICD-10-CM

## 2013-04-08 DIAGNOSIS — R079 Chest pain, unspecified: Secondary | ICD-10-CM

## 2013-04-08 DIAGNOSIS — I498 Other specified cardiac arrhythmias: Secondary | ICD-10-CM | POA: Insufficient documentation

## 2013-04-08 DIAGNOSIS — F1410 Cocaine abuse, uncomplicated: Secondary | ICD-10-CM | POA: Insufficient documentation

## 2013-04-08 DIAGNOSIS — R001 Bradycardia, unspecified: Secondary | ICD-10-CM

## 2013-04-08 NOTE — Progress Notes (Signed)
Echocardiogram performed.  

## 2013-04-10 ENCOUNTER — Encounter: Payer: Self-pay | Admitting: Cardiology

## 2013-04-10 ENCOUNTER — Other Ambulatory Visit: Payer: Self-pay

## 2013-04-10 ENCOUNTER — Ambulatory Visit (INDEPENDENT_AMBULATORY_CARE_PROVIDER_SITE_OTHER): Payer: No Typology Code available for payment source | Admitting: Cardiology

## 2013-04-10 VITALS — BP 135/86 | HR 54 | Ht 66.0 in | Wt 133.0 lb

## 2013-04-10 DIAGNOSIS — R079 Chest pain, unspecified: Secondary | ICD-10-CM

## 2013-04-10 MED ORDER — NITROGLYCERIN 0.4 MG SL SUBL: 0.4000 mg | SUBLINGUAL_TABLET | SUBLINGUAL | Status: DC | PRN

## 2013-04-10 MED ORDER — OMEPRAZOLE 20 MG PO CPDR: 20.0000 mg | DELAYED_RELEASE_CAPSULE | Freq: Every day | ORAL | Status: DC

## 2013-04-10 NOTE — Patient Instructions (Signed)
Start Aspirin 81 mg daily   Start Omeprazole ( Prilosec )  20 mg daily   Start NTG one tablet under tongue if needed for chest pain.Take one tablet every 5 mins up to 3 tablets in 15 min if no relief call 911    Your physician recommends that you schedule a follow-up appointment after treadmill
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
## 2013-04-10 NOTE — Addendum Note (Signed)
Addended by: Meda Klinefelter D on: 04/10/2013 12:17 PM   Modules accepted: Orders

## 2013-05-13 ENCOUNTER — Other Ambulatory Visit: Payer: Self-pay

## 2013-05-13 ENCOUNTER — Ambulatory Visit (INDEPENDENT_AMBULATORY_CARE_PROVIDER_SITE_OTHER): Payer: No Typology Code available for payment source | Admitting: Physician Assistant

## 2013-05-13 DIAGNOSIS — R079 Chest pain, unspecified: Secondary | ICD-10-CM

## 2013-05-13 NOTE — Progress Notes (Signed)
Exercise Treadmill Test  Pre-Exercise Testing Evaluation Rhythm: normal sinus  Rate: 63 bpm     Test  Exercise Tolerance Test Ordering MD: Tobias Alexander, MD  Interpreting MD: Tereso Newcomer PA-C  Unique Test No: 1  Treadmill:  1  Indication for ETT: chest pain - rule out ischemia  Contraindication to ETT: No   Stress Modality: exercise - treadmill  Cardiac Imaging Performed: non   Protocol: standard Bruce - maximal  Max BP:  158/81  Max MPHR (bpm):  173 85% MPR (bpm):  174  MPHR obtained (bpm):  176 % MPHR obtained:  101  Reached 85% MPHR (min:sec):  7:14 Total Exercise Time (min-sec):  10:00  Workload in METS:  11.7 Borg Scale: 17  Reason ETT Terminated:  desired heart rate attained    ST Segment Analysis At Rest: non-specific ST segment slurring; Baseline ECG with LVH and inf-lat TWI With Exercise: non-specific ST changes  Other Information Arrhythmia:  No Angina during ETT:  absent (0) Quality of ETT:  diagnostic  ETT Interpretation:  normal - no evidence of ischemia by ST analysis  Comments: Good exercise capacity. No chest pain. Normal BP response to exercise. Non-specific ST changes; no significant changes to suggest ischemia.   Recommendations: F/u with Dr. Tobias Alexander as planned.  Signed,  Tereso Newcomer, PA-C   05/13/2013 11:03 AM

## 2013-05-14 ENCOUNTER — Ambulatory Visit: Payer: No Typology Code available for payment source | Admitting: Cardiology

## 2013-05-21 ENCOUNTER — Ambulatory Visit: Payer: No Typology Code available for payment source | Admitting: Cardiology

## 2013-05-29 ENCOUNTER — Emergency Department (HOSPITAL_COMMUNITY)
Admission: EM | Admit: 2013-05-29 | Discharge: 2013-05-30 | Disposition: A | Discharge status: Home / Self Care | Payer: No Typology Code available for payment source | Attending: Emergency Medicine | Admitting: Emergency Medicine

## 2013-05-29 ENCOUNTER — Encounter (HOSPITAL_COMMUNITY): Payer: Self-pay | Admitting: Emergency Medicine

## 2013-05-29 DIAGNOSIS — K219 Gastro-esophageal reflux disease without esophagitis: Secondary | ICD-10-CM | POA: Insufficient documentation

## 2013-05-29 DIAGNOSIS — Z8679 Personal history of other diseases of the circulatory system: Secondary | ICD-10-CM | POA: Insufficient documentation

## 2013-05-29 DIAGNOSIS — F1410 Cocaine abuse, uncomplicated: Secondary | ICD-10-CM | POA: Insufficient documentation

## 2013-05-29 DIAGNOSIS — Z7982 Long term (current) use of aspirin: Secondary | ICD-10-CM | POA: Insufficient documentation

## 2013-05-29 DIAGNOSIS — T148XXA Other injury of unspecified body region, initial encounter: Secondary | ICD-10-CM

## 2013-05-29 DIAGNOSIS — Z79899 Other long term (current) drug therapy: Secondary | ICD-10-CM | POA: Insufficient documentation

## 2013-05-29 DIAGNOSIS — Y9389 Activity, other specified: Secondary | ICD-10-CM | POA: Insufficient documentation

## 2013-05-29 DIAGNOSIS — Z8659 Personal history of other mental and behavioral disorders: Secondary | ICD-10-CM | POA: Insufficient documentation

## 2013-05-29 DIAGNOSIS — S4980XA Other specified injuries of shoulder and upper arm, unspecified arm, initial encounter: Secondary | ICD-10-CM | POA: Insufficient documentation

## 2013-05-29 DIAGNOSIS — S46909A Unspecified injury of unspecified muscle, fascia and tendon at shoulder and upper arm level, unspecified arm, initial encounter: Secondary | ICD-10-CM | POA: Insufficient documentation

## 2013-05-29 DIAGNOSIS — Y92410 Unspecified street and highway as the place of occurrence of the external cause: Secondary | ICD-10-CM | POA: Insufficient documentation

## 2013-05-29 DIAGNOSIS — F1010 Alcohol abuse, uncomplicated: Secondary | ICD-10-CM | POA: Insufficient documentation

## 2013-05-29 DIAGNOSIS — S139XXA Sprain of joints and ligaments of unspecified parts of neck, initial encounter: Secondary | ICD-10-CM | POA: Insufficient documentation

## 2013-05-29 DIAGNOSIS — S59909A Unspecified injury of unspecified elbow, initial encounter: Secondary | ICD-10-CM | POA: Insufficient documentation

## 2013-05-29 DIAGNOSIS — S6990XA Unspecified injury of unspecified wrist, hand and finger(s), initial encounter: Secondary | ICD-10-CM | POA: Insufficient documentation
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
## 2013-05-30 ENCOUNTER — Emergency Department (HOSPITAL_COMMUNITY): Payer: No Typology Code available for payment source

## 2013-05-30 MED ORDER — IBUPROFEN 800 MG PO TABS: 800.0000 mg | ORAL_TABLET | Freq: Three times a day (TID) | ORAL | Status: DC

## 2013-05-30 MED ORDER — CYCLOBENZAPRINE HCL 10 MG PO TABS: 10.0000 mg | ORAL_TABLET | Freq: Three times a day (TID) | ORAL | Status: DC | PRN

## 2013-05-30 NOTE — ED Notes (Signed)
Patient walking home
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
## 2013-06-01 NOTE — ED Provider Notes (Signed)
Medical screening examination/treatment/procedure(s) were performed by non-physician practitioner and as supervising physician I was immediately available for consultation/collaboration.  EKG Interpretation   None         Junius Argyle, MD 06/01/13 0425

## 2013-06-01 NOTE — ED Provider Notes (Signed)
Medical screening examination/treatment/procedure(s) were performed by non-physician practitioner and as supervising physician I was immediately available for consultation/collaboration.  EKG Interpretation   None         Titania Gault S Pradeep Beaubrun, MD 06/01/13 0425 

## 2013-06-03 ENCOUNTER — Ambulatory Visit (INDEPENDENT_AMBULATORY_CARE_PROVIDER_SITE_OTHER): Payer: No Typology Code available for payment source | Admitting: Internal Medicine

## 2013-06-03 ENCOUNTER — Encounter: Payer: Self-pay | Admitting: Internal Medicine

## 2013-06-03 DIAGNOSIS — M25519 Pain in unspecified shoulder: Secondary | ICD-10-CM

## 2013-06-03 DIAGNOSIS — M542 Cervicalgia: Secondary | ICD-10-CM

## 2013-06-03 DIAGNOSIS — Z720 Tobacco use: Secondary | ICD-10-CM

## 2013-06-03 DIAGNOSIS — F17200 Nicotine dependence, unspecified, uncomplicated: Secondary | ICD-10-CM
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
## 2013-06-03 NOTE — Assessment & Plan Note (Signed)
  Assessment: Progress toward smoking cessation:  smoking less Barriers to progress toward smoking cessation:  withdrawal symptoms Comments: Patient wants to try to quit smoking on his own.  If unsuccessful, he states he will be open to discussion NRP's  Plan: Instruction/counseling given:  I counseled patient on the dangers of tobacco use, advised patient to stop smoking, and reviewed strategies to maximize success. Educational resources provided:  [employer]) brochure Self management tools provided:    Medications to assist with smoking cessation:  None Patient agreed to the following self-care plans for smoking cessation:  (PATIENT IS CUTTING DOWN ON HIS OWN)  Other plans: Will re-address next visit, and try NRP if amenable
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
## 2013-06-03 NOTE — Patient Instructions (Signed)
General Instructions: Congratulations on cutting back on smoking!  Quitting smoking is the best thing you can do for your health. -continue to cut back.  If you are unable to quit, let's discuss a smoking cessation aide (like the nicotine patch) at your next visit.  Please return for a follow-up visit in 6 months.  Treatment Goals:  Goals (1 Years of Data) as of 06/03/13   None      Progress Toward Treatment Goals:  Treatment Goal 06/03/2013  Stop smoking smoking less  Prevent falls improved    Self Care Goals & Plans:  Self Care Goal 06/03/2013  Monitor my health keep track of my blood pressure  Eat healthy foods eat foods that are low in salt; eat baked foods instead of fried foods  Be physically active take a walk every day  Stop smoking (No Data)  Meeting treatment goals -    No flowsheet data found.   Care Management & Community Referrals:  Referral 06/03/2013  Referrals made for care management support none needed  Referrals made to community resources -

## 2013-06-04 NOTE — Progress Notes (Signed)
Case discussed with Dr. Brown soon after the resident saw the patient.  We reviewed the resident's history and exam and pertinent patient test results.  I agree with the assessment, diagnosis, and plan of care documented in the resident's note. 

## 2013-06-09 ENCOUNTER — Ambulatory Visit: Payer: No Typology Code available for payment source | Admitting: Cardiology

## 2013-06-12 ENCOUNTER — Ambulatory Visit (INDEPENDENT_AMBULATORY_CARE_PROVIDER_SITE_OTHER): Payer: No Typology Code available for payment source | Admitting: Internal Medicine

## 2013-06-12 ENCOUNTER — Ambulatory Visit: Payer: No Typology Code available for payment source | Admitting: Internal Medicine

## 2013-06-12 ENCOUNTER — Encounter: Payer: Self-pay | Admitting: Internal Medicine

## 2013-06-12 VITALS — BP 106/66 | HR 59 | Temp 97.9°F | Ht 66.5 in | Wt 131.6 lb

## 2013-06-12 DIAGNOSIS — Z720 Tobacco use: Secondary | ICD-10-CM

## 2013-06-12 DIAGNOSIS — Z87828 Personal history of other (healed) physical injury and trauma: Secondary | ICD-10-CM

## 2013-06-12 DIAGNOSIS — F1010 Alcohol abuse, uncomplicated: Secondary | ICD-10-CM

## 2013-06-12 DIAGNOSIS — F1410 Cocaine abuse, uncomplicated: Secondary | ICD-10-CM

## 2013-06-12 DIAGNOSIS — Z0000 Encounter for general adult medical examination without abnormal findings: Secondary | ICD-10-CM

## 2013-06-12 DIAGNOSIS — F17200 Nicotine dependence, unspecified, uncomplicated: Secondary | ICD-10-CM
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
## 2013-06-12 NOTE — Assessment & Plan Note (Signed)
Pt states that he has cut back on his EtOH use over the past 2 weeks since rejoining the church. He states that he is not drinking to get drunk and is drinking about 40oz a day.

## 2013-06-12 NOTE — Patient Instructions (Signed)
Keep up the great work!

## 2013-06-12 NOTE — Assessment & Plan Note (Signed)
Pt states that he has been cocaine free for the past 2 weeks. He has joined a church and is singing in the choir trying to get his life back on track.
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
## 2013-06-12 NOTE — Assessment & Plan Note (Signed)
  Assessment: Progress toward smoking cessation:  smoking less (1/2 ppd) Barriers to progress toward smoking cessation:    Comments: pt states that he is working on quitting  Plan: Instruction/counseling given:  I counseled patient on the dangers of tobacco use, advised patient to stop smoking, and reviewed strategies to maximize success. Educational resources provided:  [employer]) brochure

## 2013-06-12 NOTE — Assessment & Plan Note (Signed)
Patient has been referred for a routine dental cleaning. Currently he has the orange card and understands that with this he will have to be put on a list and will have to wait for the next available appointment. He states that he is signing up for the Affordable Care Act next week, and will call the clinic once he has his insurance, so then we can send out further referrals for him.

## 2013-06-13 NOTE — Progress Notes (Signed)
Case discussed with Dr. Glenn at the time of the visit.  We reviewed the resident's history and exam and pertinent patient test results.  I agree with the assessment, diagnosis, and plan of care documented in the resident's note.   

## 2013-06-23 ENCOUNTER — Ambulatory Visit: Payer: No Typology Code available for payment source

## 2013-06-30 ENCOUNTER — Encounter: Payer: Self-pay | Admitting: *Deleted

## 2013-06-30 ENCOUNTER — Encounter: Payer: Self-pay | Admitting: Cardiology

## 2013-06-30 ENCOUNTER — Ambulatory Visit: Payer: No Typology Code available for payment source | Admitting: Internal Medicine

## 2013-06-30 NOTE — Addendum Note (Signed)
Addended by: Neomia Dear on: 06/30/2013 04:37 PM   Modules accepted: Orders

## 2013-07-03 ENCOUNTER — Ambulatory Visit: Payer: Self-pay

## 2013-07-07 ENCOUNTER — Ambulatory Visit: Payer: Self-pay

## 2013-07-08 ENCOUNTER — Encounter: Payer: Self-pay | Admitting: Cardiology

## 2013-07-09 ENCOUNTER — Ambulatory Visit: Payer: Self-pay

## 2013-07-15 ENCOUNTER — Ambulatory Visit: Payer: Self-pay

## 2013-07-30 ENCOUNTER — Telehealth: Payer: Self-pay | Admitting: *Deleted

## 2013-07-30 NOTE — Telephone Encounter (Signed)
SPOKE WITH Law[place]'S UNCLE (MR. [facility]). ASKED HIM TO TELL [place] TO CALL [facility]PC. [facility]

## 2013-07-30 NOTE — Telephone Encounter (Signed)
SPOKE WITH MR. [facility] CLA[facility], ASKED HIM TO HAVE [facility] CALL OPC.  THE DENTAL OFFICE HAVE BEEN TRYING TO GET IN CONTACT WITH HIM TO SET UP APPOINTMENT.  SPOKE WITH SHANNON AT THE DENTAL OFFICE, SHE WAS UNABLE TO GET IN CONTACT WITH Silvia. LELA STURDIVANT NTII

## 2013-09-22 ENCOUNTER — Ambulatory Visit: Payer: No Typology Code available for payment source | Admitting: Internal Medicine

## 2013-09-23 ENCOUNTER — Ambulatory Visit: Payer: No Typology Code available for payment source | Admitting: Internal Medicine

## 2014-05-07 ENCOUNTER — Ambulatory Visit: Payer: Self-pay | Admitting: Internal Medicine

## 2014-05-07 ENCOUNTER — Encounter: Payer: Self-pay | Admitting: Internal Medicine

## 2014-08-06 ENCOUNTER — Ambulatory Visit: Payer: Self-pay | Admitting: Family Medicine

## 2014-10-01 ENCOUNTER — Ambulatory Visit: Payer: Self-pay | Admitting: Internal Medicine

## 2014-11-12 ENCOUNTER — Encounter (HOSPITAL_COMMUNITY): Payer: Self-pay | Admitting: Emergency Medicine

## 2014-11-12 ENCOUNTER — Emergency Department (HOSPITAL_COMMUNITY)
Admission: EM | Admit: 2014-11-12 | Discharge: 2014-11-12 | Disposition: A | Discharge status: Home / Self Care | Payer: Self-pay | Attending: Emergency Medicine | Admitting: Emergency Medicine

## 2014-11-12 DIAGNOSIS — F1910 Other psychoactive substance abuse, uncomplicated: Secondary | ICD-10-CM

## 2014-11-12 DIAGNOSIS — Z9889 Other specified postprocedural states: Secondary | ICD-10-CM | POA: Insufficient documentation

## 2014-11-12 DIAGNOSIS — Z720 Tobacco use: Secondary | ICD-10-CM | POA: Insufficient documentation

## 2014-11-12 DIAGNOSIS — F1210 Cannabis abuse, uncomplicated: Secondary | ICD-10-CM | POA: Insufficient documentation

## 2014-11-12 DIAGNOSIS — K219 Gastro-esophageal reflux disease without esophagitis: Secondary | ICD-10-CM | POA: Insufficient documentation

## 2014-11-12 DIAGNOSIS — Q7649 Other congenital malformations of spine, not associated with scoliosis: Secondary | ICD-10-CM | POA: Insufficient documentation

## 2014-11-12 DIAGNOSIS — F1410 Cocaine abuse, uncomplicated: Secondary | ICD-10-CM | POA: Insufficient documentation

## 2014-11-12 DIAGNOSIS — Z8739 Personal history of other diseases of the musculoskeletal system and connective tissue: Secondary | ICD-10-CM | POA: Insufficient documentation

## 2014-11-12 LAB — COMPREHENSIVE METABOLIC PANEL
ALBUMIN: 3.9 g/dL (ref 3.5–5.0)
ALT: 28 U/L (ref 17–63)
ANION GAP: 11 (ref 5–15)
AST: 29 U/L (ref 15–41)
Alkaline Phosphatase: 57 U/L (ref 38–126)
BUN: 10 mg/dL (ref 6–20)
CHLORIDE: 103 mmol/L (ref 101–111)
CO2: 23 mmol/L (ref 22–32)
Calcium: 9 mg/dL (ref 8.9–10.3)
Creatinine, Ser: 1.25 mg/dL — ABNORMAL HIGH (ref 0.61–1.24)
GLUCOSE: 90 mg/dL (ref 65–99)
Potassium: 4 mmol/L (ref 3.5–5.1)
SODIUM: 137 mmol/L (ref 135–145)
TOTAL PROTEIN: 6.6 g/dL (ref 6.5–8.1)
Total Bilirubin: 0.8 mg/dL (ref 0.3–1.2)

## 2014-11-12 LAB — SALICYLATE LEVEL: Salicylate Lvl: <4 mg/dL (ref 2.8–30.0)

## 2014-11-12 LAB — RAPID URINE DRUG SCREEN, HOSP PERFORMED
AMPHETAMINES: NOT DETECTED
Barbiturates: NOT DETECTED
Benzodiazepines: NOT DETECTED
COCAINE: POSITIVE — AB
Opiates: NOT DETECTED
TETRAHYDROCANNABINOL: POSITIVE — AB

## 2014-11-12 LAB — CBC
HCT: 43.2 % (ref 39.0–52.0)
HEMOGLOBIN: 15.3 g/dL (ref 13.0–17.0)
MCH: 30.4 pg (ref 26.0–34.0)
MCHC: 35.4 g/dL (ref 30.0–36.0)
MCV: 85.7 fL (ref 78.0–100.0)
Platelets: 235 10*3/uL (ref 150–400)
RBC: 5.04 MIL/uL (ref 4.22–5.81)
RDW: 14.8 % (ref 11.5–15.5)
WBC: 10.5 10*3/uL (ref 4.0–10.5)

## 2014-11-12 LAB — ETHANOL: Alcohol, Ethyl (B): 144 mg/dL — ABNORMAL HIGH (ref <5)

## 2014-11-12 LAB — ACETAMINOPHEN LEVEL

## 2014-11-12 NOTE — ED Provider Notes (Signed)
TIME SEEN: 10:10 PM  CHIEF COMPLAINT: Medical clearance for substance abuse  HPI: Pt is a 49 y.o. male with history of alcohol and cocaine abuse who presents to the emergency department for medical clearance so that [facility]. States [facility] and was told he needed to be medically cleared first. Denies any medical complaints. No chest pain, shortness of breath, fevers, cough, vomiting or diarrhea. He states he drinks every day but had days or he did not drink and did not have withdrawal seizures. Also uses cocaine regularly. States he drank alcohol prior to arrival. Denies SI, HI, hallucinations.  ROS: See HPI Constitutional: no fever  Eyes: no drainage  ENT: no runny nose   Cardiovascular:  no chest pain  Resp: no SOB  GI: no vomiting GU: no dysuria Integumentary: no rash  Allergy: no hives  Musculoskeletal: no leg swelling  Neurological: no slurred speech ROS otherwise negative  PAST MEDICAL HISTORY/PAST SURGICAL HISTORY:  Past Medical History  Diagnosis Date  . Anxiety   . Depression   . Bradycardia   . GERD (gastroesophageal reflux disease)   . Congenital fusion of cervical spine     c5-c6  . DDD (degenerative disc disease), cervical     c6-c7  . Alcohol abuse 11/23/2010  . Allergic rhinitis 11/07/2010  . Cocaine abuse 11/23/2010  . Tobacco abuse 11/23/2010    MEDICATIONS:  Prior to Admission medications   Medication Sig Start Date End Date Taking? Authorizing Provider  nitroGLYCERIN (NITROSTAT) 0.4 MG SL tablet Place 1 tablet (0.4 mg total) under the tongue every 5 (five) minutes as needed for chest pain. Patient not taking: Reported on 11/12/2014 04/10/13   Lars MassonKatarina H Nelson, MD  omeprazole (PRILOSEC) 20 MG capsule Take 1 capsule (20 mg total) by mouth daily. Patient not taking: Reported on 11/12/2014 04/10/13   Lars MassonKatarina H Nelson, MD    ALLERGIES:  No Known Allergies  SOCIAL HISTORY:  History  Substance Use  Topics  . Smoking status: Current Every Day Smoker -- 0.50 packs/day    Types: Cigarettes  . Smokeless tobacco: Never Used     Comment: PATIENT STATES HE HAS CUT DOWN  . Alcohol Use: 12.6 oz/week    21 Cans of beer per week    FAMILY HISTORY: Family History  Problem Relation Age of Onset  . Stroke Paternal Aunt   . Congestive Heart Failure Paternal Grandmother     '  . Diabetes Other     maternal side of family  . Cancer      grandfather    EXAM: BP 123/76 mmHg  Pulse 72  Temp(Src) 97.8 F (36.6 C) (Oral)  Resp 19  Ht 5' 5.5" (1.664 m)  Wt 144 lb (65.318 kg)  BMI 23.59 kg/m2  SpO2 94% CONSTITUTIONAL: Alert and oriented and responds appropriately to questions. Well-appearing; well-nourished HEAD: Normocephalic EYES: Conjunctivae clear, PERRL ENT: normal nose; no rhinorrhea; moist mucous membranes; pharynx without lesions noted NECK: Supple, no meningismus, no LAD  CARD: RRR; S1 and S2 appreciated; no murmurs, no clicks, no rubs, no gallops RESP: Normal chest excursion without splinting or tachypnea; breath sounds clear and equal bilaterally; no wheezes, no rhonchi, no rales, no hypoxia or respiratory distress, speaking full sentences ABD/GI: Normal bowel sounds; non-distended; soft, non-tender, no rebound, no guarding, no peritoneal signs BACK:  The back appears normal and is non-tender to palpation, there is no CVA tenderness EXT: Normal ROM in all joints;  non-tender to palpation; no edema; normal capillary refill; no cyanosis, no calf tenderness or swelling    SKIN: Normal color for age and race; warm NEURO: Moves all extremities equally, sensation to light touch intact diffusely, cranial nerves II through XII intact PSYCH: The patient's mood and manner are appropriate. Grooming and personal hygiene are appropriate.  MEDICAL DECISION MAKING: Patient here for medical clearance to get outpatient rehabilitation. No medical complaints. Hemodynamically stable. Labs  unremarkable other than alcohol of 144. Denies history of complicated withdrawal. No psychiatric safety concerns. Urine positive for THC and cocaine. Have given outpatient resource guide and have recommended that he follow-up with [facility] in the AM.  He verbalized understanding and is comfortable with plan. Discussed return precautions.      Layla MawKristen N Ward, DO 11/12/14 2312

## 2014-11-12 NOTE — Discharge Instructions (Signed)
Polysubstance Abuse °When people abuse more than one drug or type of drug it is called polysubstance or polydrug abuse. For example, many smokers also drink alcohol. This is one form of polydrug abuse. Polydrug abuse also refers to the use of a drug to counteract an unpleasant effect produced by another drug. It may also be used to help with withdrawal from another drug. People who take stimulants may become agitated. Sometimes this agitation is countered with a tranquilizer. This helps protect against the unpleasant side effects. Polydrug abuse also refers to the use of different drugs at the same time.  °Anytime drug use is interfering with normal living activities, it has become abuse. This includes problems with family and friends. Psychological dependence has developed when your mind tells you that the drug is needed. This is usually followed by physical dependence which has developed when continuing increases of drug are required to get the same feeling or "high". This is known as addiction or chemical dependency. A person's risk is much higher if there is a history of chemical dependency in the family. °SIGNS OF CHEMICAL DEPENDENCY °· You have been told by friends or family that drugs have become a problem. °· You fight when using drugs. °· You are having blackouts (not remembering what you do while using). °· You feel sick from using drugs but continue using. °· You lie about use or amounts of drugs (chemicals) used. °· You need chemicals to get you going. °· You are suffering in work performance or in school because of drug use. °· You get sick from use of drugs but continue to use anyway. °· You need drugs to relate to people or feel comfortable in social situations. °· You use drugs to forget problems. °"Yes" answered to any of the above signs of chemical dependency indicates there are problems. The longer the use of drugs continues, the greater the problems will become. °If there is a family history of  drug or alcohol use, it is best not to experiment with these drugs. Continual use leads to tolerance. After tolerance develops more of the drug is needed to get the same feeling. This is followed by addiction. With addiction, drugs become the most important part of life. It becomes more important to take drugs than participate in the other usual activities of life. This includes relating to friends and family. Addiction is followed by dependency. Dependency is a condition where drugs are now needed not just to get high, but to feel normal. °Addiction cannot be cured but it can be stopped. This often requires outside help and the care of professionals. Treatment centers are listed in the yellow pages under: Cocaine, Narcotics, and Alcoholics Anonymous. Most hospitals and clinics can refer you to a specialized care center. Talk to your caregiver if you need help. °Document Released: 02/07/2005 Document Revised: 09/10/2011 Document Reviewed: 06/18/2005 °ExitCare® Patient Information ©2015 [employer]. This information is not intended to replace advice given to you by your health care provider. Make sure you discuss any questions you have with your health care provider. ° ° ° °Emergency Department Resource Guide °1) Find a Doctor and Pay Out of Pocket °Although you won't have to find out who is covered by your insurance plan, it is a good idea to ask around and get recommendations. You will then need to call the office and see if the doctor you have chosen will accept you as a new patient and what types of options they offer for patients who   are self-pay. Some doctors offer discounts or will set up payment plans for their patients who do not have insurance, but you will need to ask so you aren't surprised when you get to your appointment. ° °2) Contact Your Local Health Department °Not all health departments have doctors that can see patients for sick visits, but many do, so it is worth a call to see if yours does. If  you don't know where your local health department is, you can check in your phone book. The CDC also has a tool to help you locate your state's health department, and many state websites also have listings of all of their local health departments. ° °3) Find a Walk-in Clinic °If your illness is not likely to be very severe or complicated, you may want to try a walk in clinic. These are popping up all over the country in pharmacies, drugstores, and shopping centers. They're usually staffed by nurse practitioners or physician assistants that have been trained to treat common illnesses and complaints. They're usually fairly quick and inexpensive. However, if you have serious medical issues or chronic medical problems, these are probably not your best option. ° °No Primary Care Doctor: °- Call [facility] at  832-8000 - they can help you locate a primary care doctor that  accepts your insurance, provides certain services, etc. °- Physician Referral Service- 1-800-533-3463 ° °Chronic Pain Problems: °Organization         Address  Phone   Notes  °[facility]  (336) 297-2271 Patients need to be referred by their primary care doctor.  ° °Medication Assistance: °Organization         Address  Phone   Notes  °[place] [facility] [address] °[place], [place] [address] (336) 641-8030 --Must be a resident of [place] °-- Must have NO insurance coverage whatsoever (no Medicaid/ Medicare, etc.) °-- The pt. MUST have a primary care doctor that directs their care regularly and follows them in the community °  °[facility]  (866) 331-1348   °[employer]  (888) 892-1162   ° °Agencies that provide inexpensive medical care: °Organization         Address  Phone   Notes  °[facility]  (336) 832-8035   °[facility]    (336) 832-7272   °[facility] [address] °[place], [place] [address] (336) 832-4777   °[facility] [address], °[place] (336) 271-4999   °[facility]    (336) 373-0678   °[facility]    (336) 272-1050   °[facility] ° [address], [place] Phone:  (336) 832-4444, Fax:  (336) 832-4440 Hours of Operation:  9 am - 6 pm, M-F.  Also accepts Medicaid/Medicare and self-pay.  °[facility] ° [address],  Phone: (336) 832-3150, Fax: (336) 832-3151. Hours of Operation:  8:30 am - 5:30 pm, M-F.  Also accepts Medicaid and self-pay.  °[facility] [place] [address], [place] Phone: (336) 878-6027   °[facility] [address], [place], [place] (336)723-1848, Ext. 123 Mondays & Thursdays: 7-9 AM.  First 15 patients are seen on a first come, first serve basis. °  ° °Medicaid-accepting [place] Providers: ° °Organization         Address  Phone   Notes  °[facility] [address], [place] (336) 641-2100 Also accepts self-pay patients.  °[facility] [address], [place] ° (336) 856-9996   °[facility] [address], [place] (336) 288-8857   °[facility] [address] [place] [address], [place] (336) 299-7000   °Veita Bland [address], [place]  ° (336) 373-1557 Only accepts [place] Access Medicaid patients after they have their name applied to their card.  ° °Self-Pay (no insurance) in [place]: ° °Organization         Address  Phone   Notes  °Sickle Cell Patients, [facility] [address], [place] (336) 832-1970   °[facility] [address], [place] (336) 832-4400   ° [facility] ° [address], [place] (336) 992-4800   °[facility]/Dr. Osei-Bonsu ° [address] [place] [address], [place] or [address], [place] (336) [phone]  Phone number for both [place] and [place] locations is the same.  °[facility] [address], [place] (336) 299-0000   °[facility] [address] [place] [address], [place] or [address] (336) 852-7530 °(336) 878-2260   °[facility] [address], [place] (336) 350-1642, phone; (336) 294-5005, fax Sees patients 1st and 3rd Saturday of every month.  Must not qualify for public or private insurance (i.e. Medicaid, Medicare, [place] Health Choice, Veterans' Benefits) • Household income should be no more than 200% of the poverty level •The clinic cannot treat you if you are pregnant or think you are pregnant • Sexually transmitted diseases are not treated at the clinic.  ° ° °Dental Care: °Organization         Address  Phone  Notes  °[facility] [address], [place] (336) 641-6152 Accepts children up to age 21 who are enrolled in Medicaid or [place] Health Choice; pregnant women with a Medicaid card; and children who have applied for Medicaid or [place] Health Choice, but were declined, whose parents can pay a reduced fee at time of service.  °[facility] [place]  [address], [place] (336) 641-7733 Accepts children up to age 21 who are enrolled in Medicaid or [place] Health Choice; pregnant women with a Medicaid card; and children who have applied for Medicaid or  Health Choice, but were declined, whose parents can pay a reduced fee at time of service.  °[facility] ° [address],  (336) 641-4533 Patients are seen by appointment only. Walk-ins are not accepted. [facility] will see patients 18 years of age and older. °Monday - Tuesday (8am-5pm) °Most Wednesdays (8:30-5pm) °$30 per visit, cash only  °[facility] ° [address], [place] (336) 641-4533 Patients are seen by appointment  only. Walk-ins are not accepted. [facility] will see patients 18 years of age and older. °One Wednesday Evening (Monthly: Volunteer Based).  $30 per visit, cash only  °[facility]  (919) 537-3737 for adults; Children under age 4, call Graduate Pediatric Dentistry at ([phone]) [phone]. Children aged 4-14, please call ([phone]) [phone] to request a pediatric application. ° Dental services are provided in all areas of dental care including fillings, crowns and bridges, complete and partial dentures, implants, gum treatment, root canals,   and extractions. Preventive care is also provided. Treatment is provided to both adults and children. °Patients are selected via a lottery and there is often a waiting list. °  °[facility] [address], °[place] ° (336) 763-8833 [email or web address] °  °[facility] [address], [place], [place] (336)723-1848, Ext. 123 Second and Fourth Thursday of each month, opens at 6:30 AM; Clinic ends at 9 AM.  Patients are seen on a first-come first-served basis, and a limited number are seen during each clinic.  ° °[facility] ° [address], [place], [place] (336) 723-7904   Eligibility Requirements °You must have lived in [place], [place], or [place] counties for at least the last three months. °  You cannot be eligible for state or federal sponsored healthcare insurance, including Veterans Administration, Medicaid, or Medicare. °  You generally cannot be eligible for healthcare insurance through your employer.  °  How to apply: °Eligibility screenings are held every Tuesday and Wednesday afternoon from 1:00 pm until 4:00 pm. You do not need an appointment for the interview!  °[facility] [address], [place], [place] 336-631-2330   °[facility]  336-342-8273   °[facility]  336-703-3100   °[facility]  336-570-6415   ° °Behavioral Health  Resources in the Community: °Intensive Outpatient Programs °Organization         Address  Phone  Notes  °[facility] [address], [place], [place] 336-878-6098   °[facility] [address], [place], [place] 336-832-9800   °[facility] [address], [place], [place] ° 336-882-2125   °[facility] [address],  °[place], [place] 1-800-853-5163 or 336-641-4981   °Substance Abuse Resources °Organization         Address  Phone  Notes  °[facility]  336-882-2125   °[facility]  336-784-9470   °[facility]  336-285-9073   °[facility]  336-845-3988   °Residential & Outpatient Substance Abuse Program  1-800-659-3381   °Psychological Services °Organization         Address  Phone  Notes  °[facility]  336- 832-9600   °[facility]  336- 378-7881   °[facility] [address], [place] 1-800-853-5163 or 336-641-4981   ° °Mobile Crisis Teams °Organization         Address  Phone  Notes  °[facility], Mobile Crisis Care Unit  1-877-626-1772   °Assertive °[facility] ° [address] [place], [place] 336-834-9664   °Sharon DeEsch [address] °[place] 336-554-5454   ° °Self-Help/Support Groups °Organization         Address  Phone             Notes  °[facility] - variety of support groups  336- 373-1402 Call for more information  °Narcotics Anonymous (NA), [facility] [address], °[place]  2 meetings at this location  ° °Residential Treatment Programs °Organization         Address  Phone  Notes  °[facility] [address],    ° [place]  1-866-801-8205   °[facility] ° [address], [place], [place] 704-293-8524   °[facility] [address], [place] 336-845-3988 Admissions: 8am-3pm M-F  °[facility] [address]    °[place], [place] 336-841-1104   °[facility] [address], [place], [place] 336-379-7146   °[facility] [address]  °[place], [place] 336-285-9073   °[facility] Intensive Outpatient [address], [place], [place] 336-852-3033   °[facility] [address]  °[place], [place] 1-877-615-2722 or 336-784-9470   °[facility] ([facility] [address] , [place] 336-227-7417 Accepts Medicaid  °[facility] [address]  °[place] 1-800-659-3381 Substance Abuse/Addiction Treatment  ° °[place] Behavioral Health Resources °Organization         Address  Phone  Notes  °[facility]  (888) 581-9988   °Julie Brannon, PhD [address] [place], [place]   (336) 349-5553 or (336) 951-0000   °[facility]   [address] °[place], [place] (336) 349-4454   °[facility] [address], [place], [place] (336) 342-8316 Insurance/Medicaid/sponsorship through [facility]  °[facility] [address]                                    [place], [place] (336) 342-8316 Therapy/tele-psych/case  °[facility] [address]  ° [place], [place] (336) 349-2233    °Dr. Arfeen  (336) 349-4544   °[facility] [place]  [employer] [place] [facility] 1) [address],  °2) [address], [place] °3)  [address], [place] (336) 349-3220 °(336) 342-7768 ° °(336) 342-8140   °[place] [facility] (336) 342-1394 or (336) 342-3537 (After Hours)    ° ° ° °

## 2014-11-12 NOTE — ED Notes (Addendum)
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  [facility] was told he had to come here first.  Pt is tearful in triage.  Pt is homeless,  Denies SI or HI
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
## 2014-12-09 ENCOUNTER — Encounter (HOSPITAL_COMMUNITY): Payer: Self-pay | Admitting: Emergency Medicine

## 2014-12-09 ENCOUNTER — Emergency Department (HOSPITAL_COMMUNITY)
Admission: EM | Admit: 2014-12-09 | Discharge: 2014-12-09 | Disposition: A | Discharge status: Home / Self Care | Payer: Self-pay | Attending: Emergency Medicine | Admitting: Emergency Medicine

## 2014-12-09 DIAGNOSIS — E860 Dehydration: Secondary | ICD-10-CM | POA: Insufficient documentation

## 2014-12-09 DIAGNOSIS — Z8739 Personal history of other diseases of the musculoskeletal system and connective tissue: Secondary | ICD-10-CM | POA: Insufficient documentation

## 2014-12-09 DIAGNOSIS — Q7649 Other congenital malformations of spine, not associated with scoliosis: Secondary | ICD-10-CM | POA: Insufficient documentation

## 2014-12-09 DIAGNOSIS — Z720 Tobacco use: Secondary | ICD-10-CM | POA: Insufficient documentation

## 2014-12-09 DIAGNOSIS — K219 Gastro-esophageal reflux disease without esophagitis: Secondary | ICD-10-CM | POA: Insufficient documentation

## 2014-12-09 DIAGNOSIS — K2920 Alcoholic gastritis without bleeding: Secondary | ICD-10-CM | POA: Insufficient documentation

## 2014-12-09 DIAGNOSIS — Z9889 Other specified postprocedural states: Secondary | ICD-10-CM | POA: Insufficient documentation

## 2014-12-09 LAB — CBC WITH DIFFERENTIAL/PLATELET
BASOS ABS: 0.1 10*3/uL (ref 0.0–0.1)
Basophils Relative: 1 % (ref 0–1)
EOS PCT: 6 % — AB (ref 0–5)
Eosinophils Absolute: 0.5 10*3/uL (ref 0.0–0.7)
HCT: 45.3 % (ref 39.0–52.0)
Hemoglobin: 15.6 g/dL (ref 13.0–17.0)
LYMPHS ABS: 2.3 10*3/uL (ref 0.7–4.0)
LYMPHS PCT: 29 % (ref 12–46)
MCH: 30.5 pg (ref 26.0–34.0)
MCHC: 34.4 g/dL (ref 30.0–36.0)
MCV: 88.6 fL (ref 78.0–100.0)
MONO ABS: 0.5 10*3/uL (ref 0.1–1.0)
Monocytes Relative: 7 % (ref 3–12)
NEUTROS ABS: 4.5 10*3/uL (ref 1.7–7.7)
Neutrophils Relative %: 57 % (ref 43–77)
PLATELETS: 267 10*3/uL (ref 150–400)
RBC: 5.11 MIL/uL (ref 4.22–5.81)
RDW: 15.6 % — ABNORMAL HIGH (ref 11.5–15.5)
WBC: 7.8 10*3/uL (ref 4.0–10.5)

## 2014-12-09 LAB — I-STAT TROPONIN, ED
Troponin i, poc: 0.01 ng/mL (ref 0.00–0.08)
Troponin i, poc: 0 ng/mL (ref 0.00–0.08)

## 2014-12-09 LAB — COMPREHENSIVE METABOLIC PANEL
ALT: 20 U/L (ref 17–63)
AST: 22 U/L (ref 15–41)
Albumin: 4.1 g/dL (ref 3.5–5.0)
Alkaline Phosphatase: 56 U/L (ref 38–126)
Anion gap: 11 (ref 5–15)
BUN: 13 mg/dL (ref 6–20)
CALCIUM: 9 mg/dL (ref 8.9–10.3)
CHLORIDE: 107 mmol/L (ref 101–111)
CO2: 25 mmol/L (ref 22–32)
CREATININE: 1.15 mg/dL (ref 0.61–1.24)
GFR calc Af Amer: >60 mL/min (ref >60)
GLUCOSE: 76 mg/dL (ref 65–99)
Potassium: 4.1 mmol/L (ref 3.5–5.1)
Sodium: 143 mmol/L (ref 135–145)
TOTAL PROTEIN: 6.9 g/dL (ref 6.5–8.1)
Total Bilirubin: 0.6 mg/dL (ref 0.3–1.2)

## 2014-12-09 LAB — LIPASE, BLOOD: Lipase: 16 U/L — ABNORMAL LOW (ref 22–51)

## 2014-12-09 LAB — ETHANOL: Alcohol, Ethyl (B): 24 mg/dL — ABNORMAL HIGH (ref <5)

## 2014-12-09 MED ORDER — SODIUM CHLORIDE 0.9 % IV BOLUS (SEPSIS)
1000.0000 mL | Freq: Once | INTRAVENOUS | Status: AC
  Administered 2014-12-09: 1000 mL via INTRAVENOUS

## 2014-12-09 MED ORDER — METOCLOPRAMIDE HCL 10 MG PO TABS: 10.0000 mg | ORAL_TABLET | Freq: Four times a day (QID) | ORAL | Status: DC | PRN

## 2014-12-09 MED ORDER — ONDANSETRON HCL 4 MG/2ML IJ SOLN
4.0000 mg | Freq: Once | INTRAMUSCULAR | Status: AC
  Administered 2014-12-09: 4 mg via INTRAVENOUS
  Filled 2014-12-09: qty 2

## 2014-12-09 MED ORDER — OMEPRAZOLE 20 MG PO CPDR: 20.0000 mg | DELAYED_RELEASE_CAPSULE | Freq: Every day | ORAL | Status: DC

## 2014-12-09 MED ORDER — GI COCKTAIL ~~LOC~~
30.0000 mL | Freq: Once | ORAL | Status: AC
  Administered 2014-12-09: 30 mL via ORAL
  Filled 2014-12-09: qty 30

## 2014-12-09 NOTE — Discharge Instructions (Signed)
Take prilosec daily.   Take reglan as needed for vomiting.   Stay hydrated.   Stop drinking alcohol and cocaine, call detox centers.   See your doctor.   Return to ER if you have vomiting, abdominal pain, chest pain.    Emergency Department Resource Guide 1) Find a Doctor and Pay Out of Pocket Although you won't have to find out who is covered by your insurance plan, it is a good idea to ask around and get recommendations. You will then need to call the office and see if the doctor you have chosen will accept you as a new patient and what types of options they offer for patients who are self-pay. Some doctors offer discounts or will set up payment plans for their patients who do not have insurance, but you will need to ask so you aren't surprised when you get to your appointment.  2) Contact Your Local Health Department Not all health departments have doctors that can see patients for sick visits, but many do, so it is worth a call to see if yours does. If you don't know where your local health department is, you can check in your phone book. The CDC also has a tool to help you locate your state's health department, and many state websites also have listings of all of their local health departments.  3) Find a Walk-in Clinic If your illness is not likely to be very severe or complicated, you may want to try a walk in clinic. These are popping up all over the country in pharmacies, drugstores, and shopping centers. They're usually staffed by nurse practitioners or physician assistants that have been trained to treat common illnesses and complaints. They're usually fairly quick and inexpensive. However, if you have serious medical issues or chronic medical problems, these are probably not your best option.  No Primary Care Doctor: - Call Health Connect at  [phone] - they can help you locate a primary care doctor that  accepts your insurance, provides certain services, etc. - Physician  Referral Service- [phone]  Chronic Pain Problems: Organization         Address  Phone   Notes  [facility]  [phone] Patients need to be referred by their primary care doctor.   Medication Assistance: Organization         Address  Phone   Notes  [facility]ication [facility] [address] [place], [place] [address] [phone] --Must be a resident of [facility] -- Must have NO insurance coverage whatsoever (no Medicaid/ Medicare, etc.) -- The pt. MUST have a primary care doctor that directs their care regularly and follows them in the community   [facility]  [phone]   [employer]  [phone]    Agencies that provide inexpensive medical care: Organization         Address  Phone   Notes  [facility]  [phone]   [facility]    [phone]   [facility] [address] [place], [place] [address] [phone]   [facility] [address] [place] [phone]   Planned Parenthood    [phone]   [facility]    [phone]   [facility]  [address], [place] Phone:  [phone], Fax:  [phone] Hours of Operation:  9 am - 6 pm, M-F.  Also accepts Medicaid/Medicare and self-pay.  [facility]  [address], [place] Phone: [phone], Fax: [phone]. Hours of Operation:  8:30 am - 5:30 pm, M-F.  Also accepts Medicaid and self-pay.  [facility] [place] [address], [place] Phone: [phone]   [facility] [address] Natasha Bence [place], [place] [phone], Ext. [phone] Mondays & Thursdays: 7-9 AM.  First 15 patients are seen on a first come, first serve basis.    Medicaid-accepting [facility] Providers:  Organization         Address  Phone   Notes  [facility] [address], [place] [phone] Also accepts self-pay patients.  [facility] [address] Laurell Josephs [place], [place]  [phone]   [facility] [address], [place] [phone]   [facility] [address], [place] [phone]   Renaye Rakers [address], [place]   [phone] Only accepts [place] Access [place] patients after they have their name applied to their card.   Self-Pay (no insurance) in [facility]:  Organization         Address  Phone   Notes  Sickle Cell Patients, [facility] [address] [place], [place] [phone]   [facility] [address] [place], [place] [phone]   [facility]  [address], [place] [phone]   [facility]/Dr. Osei-Bonsu  [address], [place] or [address], [place] [phone] Phone number for both [place] and [place] locations is the same.  [facility] [address] [place] [phone]   [facility] [address], [place] or [address] [phone]   [facility] [address], [place] [phone], phone; [phone], fax Sees patients 1st and 3rd Saturday of every month.  Must not qualify for public or private insurance (i.e. Medicaid, Medicare, [place] Health Choice, Veterans' Benefits)  Household income should be no more than 200% of the poverty level The clinic cannot treat you if you are pregnant or think you are pregnant  Sexually transmitted diseases are not treated at the clinic.    Dental Care: Organization         Address  Phone  Notes  [facility] Department of [facility] [address] [place], [place] [phone] Accepts children up to age 7 who are enrolled  in [place] or [place] Health Choice; pregnant women with a Medicaid card; and children who have applied for Medicaid or [place] Health Choice, but were declined, whose parents can pay a reduced fee at time of service.  [facility] Department of [facility]  [address], [place] [phone] Accepts children up to age 17 who are enrolled in [place] or [place] Health Choice; pregnant women with a Medicaid card; and children who have applied for Medicaid or [place] Health Choice, but were declined, whose parents can pay a reduced fee at time of service.  [facility]  [address] [place], [place] [phone] Patients are seen by appointment only. Walk-ins are not accepted. [facility] will see patients 84 years of age and older. Monday - Tuesday (  8am-5pm) Most Wednesdays (8:30-5pm) $30 per visit, cash only  [facility]  [address], [facility] [phone] Patients are seen by appointment only. Walk-ins are not accepted. [facility] will see patients 49 years of age and older. One Wednesday Evening (Monthly: Volunteer Based).  $30 per visit, cash only  [facility]  [phone] for adults; Children under age 154, call Graduate Pediatric Dentistry at [phone]. Children aged 264-14, please call [phone]) [phone] to request a pediatric application.  Dental services are provided in all areas of dental care including fillings, crowns and bridges, complete and partial dentures, implants, gum treatment, root canals, and extractions. Preventive care is also provided. Treatment is provided to both adults and children. Patients are selected via a lottery and there is often a waiting list.   [facility] [address], [place]  [phone]) [phone] [email or web address]   [facility] [address], [place], [place] [phone], Ext. [phone] Second and Fourth Thursday of each month, opens at  6:30 AM; Clinic ends at 9 AM.  Patients are seen on a first-come first-served basis, and a limited number are seen during each clinic.   [facility]  [address], [place], [place] [phone]   Eligibility Requirements You must have lived in [place], [place], or [place] counties for at least the last three months.   You cannot be eligible for state or federal sponsored [place]healthcare insurance, including CIGNAVeterans Administration, [place]icaid, or [employer]icare.   You generally cannot be eligible for healthcare insurance through your employer.    How to apply: Eligibility screenings are held every Tuesday and Wednesday afternoon from 1:00 pm until 4:00 pm. You do not need an appointment for the interview!  [facility] [address] [place] [address], [place], [place] [phone]   [facility]  [phone]   [facility]  [phone]   [facility]  [phone]    Behavioral Health Resources in the Community: Intensive Outpatient Programs Organization         Address  Phone  Notes  [facility] [address], [place], [place] [phone]   [facility] [address], [place], [place] [phone]   [facility] [address], [place], [place]  [phone]   [facility] [address],  [place], [place] [phone] or [phone]   Substance Abuse Resources Organization         Address  Phone  Notes  [facility]  [phone]   [facility]  [phone]   [facility]  [phone]   [facility]  [phone]   Residential & Outpatient Substance Abuse Program  [phone]   Psychological Services Organization         Address  Phone  Notes  [facility]  [phone]- [phone]   [facility]  [phone]- [phone]   [facility] [address], [place]  [phone] or [phone]    Mobile Crisis Teams Organization         Address  Phone  Notes  [facility], Mobile Crisis Care Unit  [phone]   [facility]  [address] [place], [place] [phone]   Doristine LocksSharon DeEsch [address] [place] [phone]    Self-Help/Support Groups Organization         Address  Phone  Notes  [facility] [place] - variety of support groups  [place] Call for more information  Narcotics Anonymous (NA), [facility] [address], [employer] [place]  2 meetings at this location   Special educational needs teacher         Address  Phone  Notes  [facility]idential [facility] [place],    [place]  [phone]   [facility]  [address], [place] [phone], [place], [place]   [place], [place] [phone] Admissions: 8am-3pm M-F  Incentives Substance [place] [address],    [place], [place] [phone]   [facility] [address] [place], [place], [place]   [facility] [address],  [place], [place]   [facility] - Intensive Outpatient [address] Kristeen Mans [address], [place], [place]   [facility] ([place]  [place], [place] [phone] or [phone]   [facility] ([facility]) [address] [place], [place] Accepts Medicaid  [facility] [address]  [place] [phone] Substance Abuse/Addiction Treatment   [facility] Organization         Address  Phone  Notes  [facility]  [phone]   Domenic Schwab, PhD [address] Arlis Porta [place], [place]   [phone] or [phone]   [place], [place] [phone]   [facility]  [address] [place], [place] [phone] Insurance/Medicaid/sponsorship through [facility] and Families [address] [place]                                    [place], [place] [phone] [place] [address], [place] [phone]    Dr. Adele Schilder  [phone]   [facility] Dept. 1) [address], [place] 2) [place] 3)  [place] [address], [place] [phone]  [phone]   [place] [phone] or [phone] (After Hours)

## 2014-12-09 NOTE — ED Notes (Signed)
Pt states he has been drinking, Pt was seen to get clear to go to [facility]. Pt states he got frustrated and left. Pt states he drinks about 2 40s a day. Pt had vomiting last night and this morning.

## 2014-12-09 NOTE — ED Provider Notes (Signed)
CSN: [id number]     Arrival date & time 12/09/14  0806 History   First MD Initiated Contact with Patient 12/09/14 0813     Chief Complaint  Patient presents with  . Alcohol Intoxication  . Emesis     (Consider location/radiation/quality/duration/timing/severity/associated sxs/prior Treatment) The history is provided by the patient.  William Wood is a 49 y.o. male history of depression, alcohol abuse here presenting with vomiting. He went to [facility] for detox about month ago and only stayed for one day. Since then he has been drinking alcohol, about 2 40s a day. Since yesterday he has worsening epigastric pain as well as persistent vomiting. Vomited 3-4 times, whitish bilious and nonbloody vomit. Has some nausea as well. Also has some burning sensation in his chest. He also used cocaine yesterday.   Past Medical History  Diagnosis Date  . Anxiety   . Depression   . Bradycardia   . GERD (gastroesophageal reflux disease)   . Congenital fusion of cervical spine     c5-c6  . DDD (degenerative disc disease), cervical     c6-c7  . Alcohol abuse 11/23/2010  . Allergic rhinitis 11/07/2010  . Cocaine abuse 11/23/2010  . Tobacco abuse 11/23/2010   Past Surgical History  Procedure Laterality Date  . Cardiac catheterization     Family History  Problem Relation Age of Onset  . Stroke Paternal Aunt   . Congestive Heart Failure Paternal Grandmother     '  . Diabetes Other     maternal side of family  . Cancer      grandfather   History  Substance Use Topics  . Smoking status: Current Every Day Smoker -- 0.50 packs/day    Types: Cigarettes  . Smokeless tobacco: Never Used     Comment: PATIENT STATES HE HAS CUT DOWN  . Alcohol Use: 12.6 oz/week    21 Cans of beer per week    Review of Systems  Gastrointestinal: Positive for vomiting.  All other systems reviewed and are negative.     Allergies  Review of patient's allergies indicates no known allergies.  Home Medications    Prior to Admission medications   Medication Sig Start Date End Date Taking? Authorizing Provider  nitroGLYCERIN (NITROSTAT) 0.4 MG SL tablet Place 1 tablet (0.4 mg total) under the tongue every 5 (five) minutes as needed for chest pain. Patient not taking: Reported on 11/12/2014 04/10/13   Lars Masson, MD  omeprazole (PRILOSEC) 20 MG capsule Take 1 capsule (20 mg total) by mouth daily. Patient not taking: Reported on 11/12/2014 04/10/13   Lars Masson, MD   BP 119/91 mmHg  Pulse 62  Temp(Src) 98.4 F (36.9 C) (Oral)  Resp 20  SpO2 100% Physical Exam  Constitutional: He is oriented to person, place, and time.  Dehydrated   HENT:  Head: Normocephalic.  MM slightly dry   Eyes: Conjunctivae are normal. Pupils are equal, round, and reactive to light.  Neck: Normal range of motion. Neck supple.  Cardiovascular: Normal rate, regular rhythm and normal heart sounds.   Pulmonary/Chest: Effort normal and breath sounds normal. No respiratory distress. He has no wheezes. He has no rales.  Abdominal: Soft. Bowel sounds are normal.  Mild epigastric tenderness, no rebound. No RUQ tenderness or murphy sign   Musculoskeletal: Normal range of motion. He exhibits no edema or tenderness.  Neurological: He is alert and oriented to person, place, and time. No cranial nerve deficit. Coordination normal.  Skin: Skin is  warm and dry.  Psychiatric: He has a normal mood and affect. His behavior is normal. Thought content normal.  Nursing note and vitals reviewed.   ED Course  Procedures (including critical care time) Labs Review Labs Reviewed  CBC WITH DIFFERENTIAL/PLATELET - Abnormal; Notable for the following:    RDW 15.6 (*)    Eosinophils Relative 6 (*)    All other components within normal limits  LIPASE, BLOOD - Abnormal; Notable for the following:    Lipase 16 (*)    All other components within normal limits  ETHANOL - Abnormal; Notable for the following:    Alcohol, Ethyl (B) 24  (*)    All other components within normal limits  COMPREHENSIVE METABOLIC PANEL  URINE RAPID DRUG SCREEN (HOSP PERFORMED) NOT AT [facility]  I-STAT TROPOININ, ED  I-STAT TROPOININ, ED    Imaging Review No results found.   EKG Interpretation   Date/Time:  Thursday December 09 2014 08:22:00 EDT Ventricular Rate:  53 PR Interval:  116 QRS Duration: 95 QT Interval:  447 QTC Calculation: 420 R Axis:   -3 Text Interpretation:  Sinus rhythm Borderline short PR interval Probable  anteroseptal infarct, old Nonspecific T abnormalities, inferior leads  Baseline wander in lead(s) V4 V5 V6 Confirmed by Lincoln Brigham [id number]) on  12/09/2014 8:24:28 AM      MDM   Final diagnoses:  None   William Wood is a 49 y.o. male here with vomiting. Likely alcoholic gastritis vs pancreatitis. Will check labs, lipase. Chest burning sensation likely from vomiting and I doubt boerhaave or cocaine induced vasospasms. Will give zofran and reassess.    10:48 AM Labs at baseline, lipase low. ETOH 24. Given IVF and zofran. Tolerated PO fluids and food. Trop neg. Stable for outpatient detox so will give list of resources. Will give reglan prn vomiting.     Richardean Canal, MD 12/09/14 1049

## 2015-06-20 ENCOUNTER — Emergency Department (HOSPITAL_COMMUNITY): Payer: Self-pay

## 2015-06-20 ENCOUNTER — Encounter (HOSPITAL_COMMUNITY): Payer: Self-pay | Admitting: Emergency Medicine

## 2015-06-20 ENCOUNTER — Emergency Department (HOSPITAL_COMMUNITY)
Admission: EM | Admit: 2015-06-20 | Discharge: 2015-06-20 | Disposition: A | Discharge status: Home / Self Care | Payer: Self-pay | Attending: Emergency Medicine | Admitting: Emergency Medicine

## 2015-06-20 DIAGNOSIS — Z79899 Other long term (current) drug therapy: Secondary | ICD-10-CM | POA: Insufficient documentation

## 2015-06-20 DIAGNOSIS — K219 Gastro-esophageal reflux disease without esophagitis: Secondary | ICD-10-CM | POA: Insufficient documentation

## 2015-06-20 DIAGNOSIS — N5089 Other specified disorders of the male genital organs: Secondary | ICD-10-CM

## 2015-06-20 DIAGNOSIS — Z9889 Other specified postprocedural states: Secondary | ICD-10-CM | POA: Insufficient documentation

## 2015-06-20 DIAGNOSIS — Z8739 Personal history of other diseases of the musculoskeletal system and connective tissue: Secondary | ICD-10-CM | POA: Insufficient documentation

## 2015-06-20 DIAGNOSIS — N4889 Other specified disorders of penis: Secondary | ICD-10-CM | POA: Insufficient documentation

## 2015-06-20 DIAGNOSIS — T7840XA Allergy, unspecified, initial encounter: Secondary | ICD-10-CM | POA: Insufficient documentation

## 2015-06-20 DIAGNOSIS — F17210 Nicotine dependence, cigarettes, uncomplicated: Secondary | ICD-10-CM | POA: Insufficient documentation

## 2015-06-20 DIAGNOSIS — Q7649 Other congenital malformations of spine, not associated with scoliosis: Secondary | ICD-10-CM | POA: Insufficient documentation

## 2015-06-20 DIAGNOSIS — J3489 Other specified disorders of nose and nasal sinuses: Secondary | ICD-10-CM | POA: Insufficient documentation

## 2015-06-20 DIAGNOSIS — Z8659 Personal history of other mental and behavioral disorders: Secondary | ICD-10-CM | POA: Insufficient documentation

## 2015-06-20 LAB — CBC WITH DIFFERENTIAL/PLATELET
Basophils Absolute: 0 10*3/uL (ref 0.0–0.1)
Basophils Relative: 0 %
Eosinophils Absolute: 0.2 10*3/uL (ref 0.0–0.7)
Eosinophils Relative: 2 %
HCT: 39.3 % (ref 39.0–52.0)
Hemoglobin: 13.6 g/dL (ref 13.0–17.0)
Lymphocytes Relative: 14 %
Lymphs Abs: 1.5 10*3/uL (ref 0.7–4.0)
MCH: 30.3 pg (ref 26.0–34.0)
MCHC: 34.6 g/dL (ref 30.0–36.0)
MCV: 87.5 fL (ref 78.0–100.0)
Monocytes Absolute: 0.8 10*3/uL (ref 0.1–1.0)
Monocytes Relative: 7 %
Neutro Abs: 8.5 10*3/uL — ABNORMAL HIGH (ref 1.7–7.7)
Neutrophils Relative %: 77 %
Platelets: 167 10*3/uL (ref 150–400)
RBC: 4.49 MIL/uL (ref 4.22–5.81)
RDW: 15.4 % (ref 11.5–15.5)
WBC: 10.9 10*3/uL — ABNORMAL HIGH (ref 4.0–10.5)

## 2015-06-20 LAB — URINALYSIS, ROUTINE W REFLEX MICROSCOPIC
Bilirubin Urine: NEGATIVE
Glucose, UA: NEGATIVE mg/dL
Hgb urine dipstick: NEGATIVE
Ketones, ur: NEGATIVE mg/dL
LEUKOCYTES UA: NEGATIVE
NITRITE: NEGATIVE
Protein, ur: NEGATIVE mg/dL
SPECIFIC GRAVITY, URINE: 1.022 (ref 1.005–1.030)
pH: 5.5 (ref 5.0–8.0)

## 2015-06-20 LAB — COMPREHENSIVE METABOLIC PANEL
ALT: 19 U/L (ref 17–63)
ANION GAP: 6 (ref 5–15)
AST: 22 U/L (ref 15–41)
Albumin: 3.4 g/dL — ABNORMAL LOW (ref 3.5–5.0)
Alkaline Phosphatase: 54 U/L (ref 38–126)
BUN: 13 mg/dL (ref 6–20)
CHLORIDE: 107 mmol/L (ref 101–111)
CO2: 28 mmol/L (ref 22–32)
Calcium: 8.8 mg/dL — ABNORMAL LOW (ref 8.9–10.3)
Creatinine, Ser: 1.13 mg/dL (ref 0.61–1.24)
Glucose, Bld: 102 mg/dL — ABNORMAL HIGH (ref 65–99)
POTASSIUM: 4.1 mmol/L (ref 3.5–5.1)
Sodium: 141 mmol/L (ref 135–145)
Total Bilirubin: 0.5 mg/dL (ref 0.3–1.2)
Total Protein: 5.7 g/dL — ABNORMAL LOW (ref 6.5–8.1)

## 2015-06-20 MED ORDER — PREDNISONE 20 MG PO TABS
60.0000 mg | ORAL_TABLET | Freq: Once | ORAL | Status: AC
  Administered 2015-06-20: 60 mg via ORAL
  Filled 2015-06-20: qty 3

## 2015-06-20 MED ORDER — DIPHENHYDRAMINE HCL 25 MG PO CAPS
25.0000 mg | ORAL_CAPSULE | Freq: Once | ORAL | Status: AC
  Administered 2015-06-20: 25 mg via ORAL
  Filled 2015-06-20: qty 1

## 2015-06-20 NOTE — ED Provider Notes (Signed)
CSN: [id number]     Arrival date & time 06/20/15  0902 History   First MD Initiated Contact with Patient 06/20/15 [phone]     Chief Complaint  Patient presents with  . Groin Swelling     (Consider location/radiation/quality/duration/timing/severity/associated sxs/prior Treatment) Patient is a 49 y.o. male presenting with male genitourinary complaint. The history is provided by the patient.  Male GU Problem Presenting symptoms: no dysuria, no penile discharge and no penile pain   Presenting symptoms comment:  Scrotal swelling  Context: spontaneously   Context: not after injury, not after intercourse, not after urination, not during intercourse and not during urination   Relieved by:  None tried Worsened by:  Nothing tried Ineffective treatments:  None tried Associated symptoms: scrotal swelling   Associated symptoms: no abdominal pain, no diarrhea, no fever, no flank pain, no genital itching, no genital lesions, no genital rash, no groin pain, no hematuria, no nausea, no penile redness, no penile swelling, no priapism, no urinary frequency, no urinary hesitation, no urinary incontinence, no urinary retention and no vomiting   Risk factors: no recent infection and no STD exposure     Past Medical History  Diagnosis Date  . Anxiety   . Depression   . Bradycardia   . GERD (gastroesophageal reflux disease)   . Congenital fusion of cervical spine     c5-c6  . DDD (degenerative disc disease), cervical     c6-c7  . Alcohol abuse 11/23/2010  . Allergic rhinitis 11/07/2010  . Cocaine abuse 11/23/2010  . Tobacco abuse 11/23/2010   Past Surgical History  Procedure Laterality Date  . Cardiac catheterization     Family History  Problem Relation Age of Onset  . Stroke Paternal Aunt   . Congestive Heart Failure Paternal Grandmother     '  . Diabetes Other     maternal side of family  . Cancer      grandfather   Social History  Substance Use Topics  . Smoking status: Current Every Day  Smoker -- 0.50 packs/day    Types: Cigarettes  . Smokeless tobacco: Never Used     Comment: PATIENT STATES HE HAS CUT DOWN  . Alcohol Use: 12.6 oz/week    21 Cans of beer per week    Review of Systems  Constitutional: Negative for fever, activity change and appetite change.  HENT: Positive for congestion and rhinorrhea.   Eyes: Negative for pain.  Respiratory: Negative for chest tightness and shortness of breath.   Cardiovascular: Negative for chest pain.  Gastrointestinal: Negative for nausea, vomiting, abdominal pain, diarrhea and blood in stool.  Genitourinary: Positive for scrotal swelling. Negative for bladder incontinence, dysuria, hesitancy, frequency, hematuria, flank pain, decreased urine volume, discharge, penile swelling, difficulty urinating, penile pain and testicular pain.  Musculoskeletal: Negative for back pain, arthralgias and gait problem.  Skin: Negative for rash.  Neurological: Negative for dizziness and headaches.      Allergies  Review of patient's allergies indicates no known allergies.  Home Medications   Prior to Admission medications   Medication Sig Start Date End Date Taking? Authorizing Provider  metoCLOPramide (REGLAN) 10 MG tablet Take 1 tablet (10 mg total) by mouth every 6 (six) hours as needed for nausea. 12/09/14   Richardean Canal, MD  nitroGLYCERIN (NITROSTAT) 0.4 MG SL tablet Place 1 tablet (0.4 mg total) under the tongue every 5 (five) minutes as needed for chest pain. Patient not taking: Reported on 11/12/2014 04/10/13   Lars Masson, MD  omeprazole (PRILOSEC) 20 MG capsule Take 1 capsule (20 mg total) by mouth daily. 12/09/14   Richardean Canal, MD   There were no vitals taken for this visit. Physical Exam  Constitutional: He is oriented to person, place, and time. He appears well-developed and well-nourished. No distress.  HENT:  Head: Normocephalic and atraumatic.  Mouth/Throat: No oropharyngeal exudate.  Eyes: Conjunctivae and EOM are normal.  Pupils are equal, round, and reactive to light.  Neck: Normal range of motion. Neck supple.  Cardiovascular: Normal rate, regular rhythm and normal heart sounds.  Exam reveals no gallop and no friction rub.   No murmur heard. Pulmonary/Chest: Effort normal and breath sounds normal. No respiratory distress. He has no wheezes. He has no rales. He exhibits no tenderness.  Abdominal: Soft. Bowel sounds are normal. He exhibits no distension and no mass. There is no tenderness. There is no rebound and no guarding. Hernia confirmed negative in the right inguinal area and confirmed negative in the left inguinal area.  Genitourinary: Penis normal. Right testis shows swelling. Right testis shows no mass and no tenderness. Right testis is descended. Left testis shows swelling. Left testis shows no mass and no tenderness. Left testis is descended. Circumcised.  Musculoskeletal: Normal range of motion.  Lymphadenopathy:       Right: No inguinal adenopathy present.       Left: No inguinal adenopathy present.  Neurological: He is alert and oriented to person, place, and time.  Skin: Skin is warm and dry. He is not diaphoretic.  Psychiatric: He has a normal mood and affect.    ED Course  Procedures (including critical care time) Labs Review Labs Reviewed  COMPREHENSIVE METABOLIC PANEL - Abnormal; Notable for the following:    Glucose, Bld 102 (*)    Calcium 8.8 (*)    Total Protein 5.7 (*)    Albumin 3.4 (*)    All other components within normal limits  CBC WITH DIFFERENTIAL/PLATELET - Abnormal; Notable for the following:    WBC 10.9 (*)    Neutro Abs 8.5 (*)    All other components within normal limits  URINE CULTURE  URINALYSIS, ROUTINE W REFLEX MICROSCOPIC (NOT AT [facility])    Imaging Review US Scrotum  06/20/2015  CLINICAL DATA:  Bilateral testicular enlargement . EXAM: SCROTAL ULTRASOUND DOPPLER ULTRASOUND OF THE TESTICLES TECHNIQUE: Complete ultrasound examination of the testicles, epididymis,  and other scrotal structures was performed. Color and spectral Doppler ultrasound were also utilized to evaluate blood flow to the testicles. COMPARISON:  No prior. FINDINGS: Right testicle Measurements: 4.9 x 2.8 x 3.2 cm. No mass or microlithiasis visualized. Left testicle Measurements: 4.6 x 2.7 x 3.2 cm. No mass or microlithiasis visualized. Right epididymis:  5 mm simple cyst. Left epididymis:  Normal in size and appearance. Hydrocele: Small right hydrocele. Mild scrotal thickening is present. Varicocele:  None visualized. Pulsed Doppler interrogation of both testes demonstrates normal low resistance arterial and venous waveforms bilaterally. IMPRESSION: 1. Small right hydrocele. 2. Scrotal skin thickening. No evidence of testicular mass or torsion. Electronically Signed   By: Maisie Fus  Register   On: 06/20/2015 11:46   [place] Art/ven Flow Abd Pelv Doppler  06/20/2015  CLINICAL DATA:  Bilateral testicular enlargement . EXAM: SCROTAL ULTRASOUND DOPPLER ULTRASOUND OF THE TESTICLES TECHNIQUE: Complete ultrasound examination of the testicles, epididymis, and other scrotal structures was performed. Color and spectral Doppler ultrasound were also utilized to evaluate blood flow to the testicles. COMPARISON:  No prior. FINDINGS: Right testicle Measurements: 4.9  x 2.8 x 3.2 cm. No mass or microlithiasis visualized. Left testicle Measurements: 4.6 x 2.7 x 3.2 cm. No mass or microlithiasis visualized. Right epididymis:  5 mm simple cyst. Left epididymis:  Normal in size and appearance. Hydrocele: Small right hydrocele. Mild scrotal thickening is present. Varicocele:  None visualized. Pulsed Doppler interrogation of both testes demonstrates normal low resistance arterial and venous waveforms bilaterally. IMPRESSION: 1. Small right hydrocele. 2. Scrotal skin thickening. No evidence of testicular mass or torsion. Electronically Signed   By: Maisie Fushomas  Register   On: 06/20/2015 11:46   I have personally reviewed and evaluated  these images and lab results as part of my medical decision-making.   EKG Interpretation None      MDM   Final diagnoses:  Scrotal swelling  Penile swelling  Allergic reaction, initial encounter    49 year old African-American male with no significant past medical history presents to the setting of scrotal swelling. Patient reports happened intermittently over the last several years and has been told previously was an allergic reaction. On arrival patient is hemodynamically stable and did not complain of any shortness of breath did not have any skin rash. Scrotal area was noted to be swollen without any significant redness or warmth concerning for cellulitis or other infection. Patient without pain in the area but does endorse some pruritus.  Considering this will obtain laboratory analysis including urinalysis and ultrasound of scrotum and will reassess after results.  Ultrasound did not reveal obvious cause of swelling or significant abnormality. Urinalysis without signs of infection. Mild elevation in white blood cell count CBC. On reevaluation patient's scrotal swelling had improved significant pain but his penis had begun to swell. Considering this patient given prednisone and Benadryl. We'll reassess patient after medicines.  On reassessment patient reported improvement in pruritus and pedal swelling was improving. This is likely allergic reaction of unknown origin. Advised patient to follow-up with PCP within one week for reevaluation and further management of symptoms. Patient stable at time of discharge and given strict return precautions.  Attending has seen and evaluated patient and Dr. Rubin PayorPickering is in agreement with plan.    Stacy GardnerAndrew Devaughn Savant, MD 06/20/15 1540  Benjiman CoreNathan Pickering, MD 06/22/15 2225

## 2015-06-20 NOTE — ED Notes (Signed)
Brought patient back to room; patient getting undressed and into a gown at this time 

## 2015-06-20 NOTE — ED Notes (Signed)
Pt to ER via POV with complaint of groin and scrotal swelling onset last night. Pt denies pain but reports itching. Pt believes this is an allergic reaction. A/O x4. VSS. Pt denies urinary difficulty or penile discharge.

## 2015-06-20 NOTE — Discharge Instructions (Signed)
Allergies °An allergy is when your body reacts to a substance in a way that is not normal. An allergic reaction can happen after you: °· Eat something. °· Breathe in something. °· Touch something. °WHAT KINDS OF ALLERGIES ARE THERE? °You can be allergic to: °· Things that are only around during certain seasons, like molds and pollens. °· Foods. °· Drugs. °· Insects. °· Animal dander. °WHAT ARE SYMPTOMS OF ALLERGIES? °· Puffiness (swelling). This may happen on the lips, face, tongue, mouth, or throat. °· Sneezing. °· Coughing. °· Breathing loudly (wheezing). °· Stuffy nose. °· Tingling in the mouth. °· A rash. °· Itching. °· Itchy, red, puffy areas of skin (hives). °· Watery eyes. °· Throwing up (vomiting). °· Watery poop (diarrhea). °· Dizziness. °· Feeling faint or fainting. °· Trouble breathing or swallowing. °· A tight feeling in the chest. °· A fast heartbeat. °HOW ARE ALLERGIES DIAGNOSED? °Allergies can be diagnosed with: °· A medical and family history. °· Skin tests. °· Blood tests. °· A food diary. A food diary is a record of all the foods, drinks, and symptoms you have each day. °· The results of an elimination diet. This diet involves making sure not to eat certain foods and then seeing what happens when you start eating them again. °HOW ARE ALLERGIES TREATED? °There is no cure for allergies, but allergic reactions can be treated with medicine. Severe reactions usually need to be treated at a hospital.  °HOW CAN REACTIONS BE PREVENTED? °The best way to prevent an allergic reaction is to avoid the thing you are allergic to. Allergy shots and medicines can also help prevent reactions in some cases. °  °This information is not intended to replace advice given to you by your health care provider. Make sure you discuss any questions you have with your health care provider. °  °Document Released: 10/13/2012 Document Revised: 07/09/2014 Document Reviewed: 03/30/2014 °Elsevier Interactive Patient Education ©2016  [employer] ° °

## 2015-06-21 LAB — URINE CULTURE: Culture: NO GROWTH

## 2015-07-07 DIAGNOSIS — Z23 Encounter for immunization: Secondary | ICD-10-CM

## 2015-07-11 NOTE — Congregational Nurse Program (Signed)
Congregational Nurse Program Note  Date of Encounter: 07/07/2015  Past Medical History: Past Medical History  Diagnosis Date  . Anxiety   . Depression   . Bradycardia   . GERD (gastroesophageal reflux disease)   . Congenital fusion of cervical spine     c5-c6  . DDD (degenerative disc disease), cervical     c6-c7  . Alcohol abuse 11/23/2010  . Allergic rhinitis 11/07/2010  . Cocaine abuse 11/23/2010  . Tobacco abuse 11/23/2010    Encounter Details:     CNP Questionnaire - 07/07/15 [id number]    Patient Demographics   Is this a new or existing patient? New   Patient is considered a/an Not Applicable   Race African-American/Black   Patient Assistance   Location of Patient Assistance Not Applicable   Patient's financial/insurance status Low Income;[place] [employer]   Uninsured Patient Yes   Interventions Counseled to make appt. with provider   Patient referred to apply for the following financial assistance Not Applicable   Food insecurities addressed Provided food supplies   Transportation assistance No   Assistance securing medications No   Educational health offerings Other   Encounter Details   Primary purpose of visit Education/Health Concerns   Was an Emergency Department visit averted? Not Applicable   Does patient have a medical provider? Yes   Patient referred to Not Applicable   Was a mental health screening completed? (GAINS tool) No   Does patient have dental issues? No   Since previous encounter, have you referred patient for abnormal blood pressure that resulted in a new diagnosis or medication change? No   Since previous encounter, have you referred patient for abnormal blood glucose that resulted in a new diagnosis or medication change? No   For Abstraction Use Only   Does patient have insurance? Yes       Clinic visit. Flu vaccine given

## 2015-09-20 ENCOUNTER — Emergency Department (HOSPITAL_BASED_OUTPATIENT_CLINIC_OR_DEPARTMENT_OTHER)
Admission: EM | Admit: 2015-09-20 | Discharge: 2015-09-21 | Disposition: A | Discharge status: Home / Self Care | Payer: No Typology Code available for payment source | Attending: Emergency Medicine | Admitting: Emergency Medicine

## 2015-09-20 ENCOUNTER — Other Ambulatory Visit: Payer: Self-pay

## 2015-09-20 ENCOUNTER — Encounter (HOSPITAL_BASED_OUTPATIENT_CLINIC_OR_DEPARTMENT_OTHER): Payer: Self-pay

## 2015-09-20 DIAGNOSIS — Z8659 Personal history of other mental and behavioral disorders: Secondary | ICD-10-CM | POA: Insufficient documentation

## 2015-09-20 DIAGNOSIS — Z8719 Personal history of other diseases of the digestive system: Secondary | ICD-10-CM | POA: Insufficient documentation

## 2015-09-20 DIAGNOSIS — F17210 Nicotine dependence, cigarettes, uncomplicated: Secondary | ICD-10-CM | POA: Insufficient documentation

## 2015-09-20 DIAGNOSIS — Q7649 Other congenital malformations of spine, not associated with scoliosis: Secondary | ICD-10-CM | POA: Insufficient documentation

## 2015-09-20 DIAGNOSIS — R531 Weakness: Secondary | ICD-10-CM | POA: Insufficient documentation

## 2015-09-20 DIAGNOSIS — Z9889 Other specified postprocedural states: Secondary | ICD-10-CM | POA: Insufficient documentation

## 2015-09-20 DIAGNOSIS — R001 Bradycardia, unspecified: Secondary | ICD-10-CM | POA: Insufficient documentation

## 2015-09-20 MED ORDER — PANTOPRAZOLE SODIUM 40 MG IV SOLR
40.0000 mg | Freq: Once | INTRAVENOUS | Status: AC
  Administered 2015-09-21: 40 mg via INTRAVENOUS
  Filled 2015-09-20: qty 40

## 2015-09-20 NOTE — ED Notes (Signed)
Nurse first-pt to nse desk to ? Wait time-advised of extremely busy ED and procedure for exam room placement-pt NAD-steady gait

## 2015-09-20 NOTE — ED Notes (Signed)
C/o fatigue x today-at [facility] since last Wednesday for cocaine use-was notified HR in the 40s-denies pain

## 2015-09-20 NOTE — ED Provider Notes (Signed)
CSN: [id number]     Arrival date & time 09/20/15  1820 History  By signing my name below, I, William Wood, attest that this documentation has been prepared under the direction and in the presence of Paula LibraJohn Keir Viernes, MD. Electronically Signed: Octavia HeirArianna Wood, ED Scribe. 09/20/2015. 11:20 PM.    Chief Complaint  Patient presents with  . Fatigue      The history is provided by the patient. No language interpreter was used.   HPI Comments: William KeelsRicky C Wood is a 50 y.o. male who has a hx of bradycardia, GERD, cocaine abuse and etoh abuse [facility]. He was sent to the ED because of decreased energy, increased sleep and either unwillingness or inability to participate and group activities. He was noted to be bradycardic but has a long-standing history of sinus bradycardia. He was noted to have a low-grade fever on arrival. He denies chest pain, nausea, vomiting, diarrhea, and cough. He reports having constipation. A review of the pt's EKG's have shown that he has marked bradycardia as far back as 2012.   Past Medical History  Diagnosis Date  . Anxiety   . Depression   . Bradycardia   . GERD (gastroesophageal reflux disease)   . Congenital fusion of cervical spine     c5-c6  . DDD (degenerative disc disease), cervical     c6-c7  . Alcohol abuse 11/23/2010  . Allergic rhinitis 11/07/2010  . Cocaine abuse 11/23/2010  . Tobacco abuse 11/23/2010   Past Surgical History  Procedure Laterality Date  . Cardiac catheterization     Family History  Problem Relation Age of Onset  . Stroke Paternal Aunt   . Congestive Heart Failure Paternal Grandmother     '  . Diabetes Other     maternal side of family  . Cancer      grandfather   Social History  Substance Use Topics  . Smoking status: Current Every Day Smoker -- 0.50 packs/day    Types: Cigarettes  . Smokeless tobacco: Never Used  . Alcohol Use: No     Comment: in rehab    Review of Systems  A  complete 10 system review of systems was obtained and all systems are negative except as noted in the HPI and PMH.    Allergies  Review of patient's allergies indicates no known allergies.  Home Medications   Prior to Admission medications   Not on File   Triage vitals: BP 119/78 mmHg  Pulse 54  Temp(Src) 99.2 F (37.3 C) (Oral)  Resp 16  Ht 5\' 5"  (1.651 m)  Wt 140 lb (63.504 kg)  BMI 23.30 kg/m2  SpO2 100% Physical Exam General: Well-developed, well-nourished male in no acute distress; appearance consistent with age of record HENT: normocephalic; atraumatic Eyes: pupils equal, round and reactive to light; extraocular muscles intact Neck: supple Heart: regular rate and rhythm; bradycardia Lungs: clear to auscultation bilaterally Abdomen: soft; nondistended; nontender; no masses or hepatosplenomegaly; bowel sounds present Extremities: No deformity; full range of motion; pulses normal Neurologic: Awake, alert and oriented; motor function intact in all extremities and symmetric; no facial droop Skin: Warm and dry Psychiatric: Normal mood and affect   ED Course  Procedures DIAGNOSTIC STUDIES: Oxygen Saturation is 100% on RA, normal by my interpretation.  COORDINATION OF CARE:  11:19 PM Discussed treatment plan which includes lab work and urinalysis with pt at bedside and pt agreed to plan.    EKG Interpretation   Date/Time:  Tuesday September 20 2015 18:56:08 EDT Ventricular Rate:  49 PR Interval:  114 QRS Duration: 84 QT Interval:  422 QTC Calculation: 381 R Axis:   37 Text Interpretation:  Sinus bradycardia Septal infarct , age undetermined  Abnormal ECG Unchanged Confirmed by Read Drivers  MD, Jonny Ruiz ([id number]) on 09/20/2015  10:58:04 PM      MDM   Nursing notes and vitals signs, including pulse oximetry, reviewed.  Summary of this visit's results, reviewed by myself:  Labs:  Results for orders placed or performed during the hospital encounter of 09/20/15 (from the past  24 hour(s))  Urinalysis, Routine w reflex microscopic (not at [facility])     Status: Abnormal   Collection Time: 09/21/15 12:05 AM  Result Value Ref Range   Color, Urine YELLOW YELLOW   APPearance CLEAR CLEAR   Specific Gravity, Urine 1.012 1.005 - 1.030   pH 6.0 5.0 - 8.0   Glucose, UA NEGATIVE NEGATIVE mg/dL   Hgb urine dipstick NEGATIVE NEGATIVE   Bilirubin Urine NEGATIVE NEGATIVE   Ketones, ur NEGATIVE NEGATIVE mg/dL   Protein, ur NEGATIVE NEGATIVE mg/dL   Nitrite NEGATIVE NEGATIVE   Leukocytes, UA SMALL (A) NEGATIVE  Urine microscopic-add on     Status: Abnormal   Collection Time: 09/21/15 12:05 AM  Result Value Ref Range   Squamous Epithelial / LPF 0-5 (A) NONE SEEN   WBC, UA 0-5 0 - 5 WBC/hpf   RBC / HPF NONE SEEN 0 - 5 RBC/hpf   Bacteria, UA FEW (A) NONE SEEN  CBC with Differential/Platelet     Status: None   Collection Time: 09/21/15 12:16 AM  Result Value Ref Range   WBC 9.5 4.0 - 10.5 K/uL   RBC 4.75 4.22 - 5.81 MIL/uL   Hemoglobin 14.3 13.0 - 17.0 g/dL   HCT 60.4 54.0 - 98.1 %   MCV 86.5 78.0 - 100.0 fL   MCH 30.1 26.0 - 34.0 pg   MCHC 34.8 30.0 - 36.0 g/dL   RDW 19.1 47.8 - 29.5 %   Platelets 242 150 - 400 K/uL   Neutrophils Relative % 50 %   Neutro Abs 4.7 1.7 - 7.7 K/uL   Lymphocytes Relative 36 %   Lymphs Abs 3.4 0.7 - 4.0 K/uL   Monocytes Relative 8 %   Monocytes Absolute 0.8 0.1 - 1.0 K/uL   Eosinophils Relative 5 %   Eosinophils Absolute 0.5 0.0 - 0.7 K/uL   Basophils Relative 1 %   Basophils Absolute 0.1 0.0 - 0.1 K/uL  Basic metabolic panel     Status: None   Collection Time: 09/21/15 12:16 AM  Result Value Ref Range   Sodium 139 135 - 145 mmol/L   Potassium 4.1 3.5 - 5.1 mmol/L   Chloride 102 101 - 111 mmol/L   CO2 29 22 - 32 mmol/L   Glucose, Bld 99 65 - 99 mg/dL   BUN 17 6 - 20 mg/dL   Creatinine, Ser 6.21 0.61 - 1.24 mg/dL   Calcium 9.6 8.9 - 30.8 mg/dL   GFR calc non Af Amer >60 >60 mL/min   GFR calc Af Amer >60 >60 mL/min   Anion gap 8 5  - 15   12:58 AM The patient's bradycardia is chronic. I suspect increased fatigue is due to an acute viral illness. He does not appear to have any condition that requires any further emergent intervention.  I personally performed the services described in this documentation, which was scribed in my presence. The recorded  information has been reviewed and is accurate.   Paula Libra, MD 09/21/15 [phone]

## 2015-09-21 LAB — URINALYSIS, ROUTINE W REFLEX MICROSCOPIC
BILIRUBIN URINE: NEGATIVE
Glucose, UA: NEGATIVE mg/dL
Hgb urine dipstick: NEGATIVE
KETONES UR: NEGATIVE mg/dL
NITRITE: NEGATIVE
PH: 6 (ref 5.0–8.0)
PROTEIN: NEGATIVE mg/dL
Specific Gravity, Urine: 1.012 (ref 1.005–1.030)

## 2015-09-21 LAB — URINE MICROSCOPIC-ADD ON: RBC / HPF: NONE SEEN RBC/hpf (ref 0–5)

## 2015-09-21 LAB — CBC WITH DIFFERENTIAL/PLATELET
BASOS ABS: 0.1 10*3/uL (ref 0.0–0.1)
Basophils Relative: 1 %
EOS ABS: 0.5 10*3/uL (ref 0.0–0.7)
Eosinophils Relative: 5 %
HCT: 41.1 % (ref 39.0–52.0)
HEMOGLOBIN: 14.3 g/dL (ref 13.0–17.0)
Lymphocytes Relative: 36 %
Lymphs Abs: 3.4 10*3/uL (ref 0.7–4.0)
MCH: 30.1 pg (ref 26.0–34.0)
MCHC: 34.8 g/dL (ref 30.0–36.0)
MCV: 86.5 fL (ref 78.0–100.0)
MONOS PCT: 8 %
Monocytes Absolute: 0.8 10*3/uL (ref 0.1–1.0)
NEUTROS ABS: 4.7 10*3/uL (ref 1.7–7.7)
NEUTROS PCT: 50 %
Platelets: 242 10*3/uL (ref 150–400)
RBC: 4.75 MIL/uL (ref 4.22–5.81)
RDW: 14.6 % (ref 11.5–15.5)
WBC: 9.5 10*3/uL (ref 4.0–10.5)

## 2015-09-21 LAB — BASIC METABOLIC PANEL
ANION GAP: 8 (ref 5–15)
BUN: 17 mg/dL (ref 6–20)
CALCIUM: 9.6 mg/dL (ref 8.9–10.3)
CHLORIDE: 102 mmol/L (ref 101–111)
CO2: 29 mmol/L (ref 22–32)
CREATININE: 1.02 mg/dL (ref 0.61–1.24)
GFR calc non Af Amer: >60 mL/min (ref >60)
Glucose, Bld: 99 mg/dL (ref 65–99)
Potassium: 4.1 mmol/L (ref 3.5–5.1)
SODIUM: 139 mmol/L (ref 135–145)

## 2015-09-21 NOTE — ED Notes (Signed)
Ambulated to bathroom for urine

## 2015-09-21 NOTE — ED Notes (Signed)
MD at bedside. 

## 2015-09-26 ENCOUNTER — Telehealth: Payer: Self-pay | Admitting: *Deleted

## 2015-09-26 NOTE — Telephone Encounter (Signed)
NEEDS A CALL BACK/[phone]

## 2015-09-26 NOTE — Telephone Encounter (Signed)
Tried the number provided as well as the number on profile, unable to leave VM on 1st number and 2nd number of a different name.  Not currently our patient.  Will wait for call back.

## 2015-09-29 ENCOUNTER — Emergency Department (HOSPITAL_BASED_OUTPATIENT_CLINIC_OR_DEPARTMENT_OTHER)
Admission: EM | Admit: 2015-09-29 | Discharge: 2015-09-29 | Disposition: A | Discharge status: Home / Self Care | Payer: No Typology Code available for payment source | Attending: Emergency Medicine | Admitting: Emergency Medicine

## 2015-09-29 ENCOUNTER — Encounter (HOSPITAL_BASED_OUTPATIENT_CLINIC_OR_DEPARTMENT_OTHER): Payer: Self-pay

## 2015-09-29 DIAGNOSIS — H6092 Unspecified otitis externa, left ear: Secondary | ICD-10-CM | POA: Insufficient documentation

## 2015-09-29 DIAGNOSIS — Z8719 Personal history of other diseases of the digestive system: Secondary | ICD-10-CM | POA: Insufficient documentation

## 2015-09-29 DIAGNOSIS — Q7649 Other congenital malformations of spine, not associated with scoliosis: Secondary | ICD-10-CM | POA: Insufficient documentation

## 2015-09-29 DIAGNOSIS — Z8739 Personal history of other diseases of the musculoskeletal system and connective tissue: Secondary | ICD-10-CM | POA: Insufficient documentation

## 2015-09-29 DIAGNOSIS — Z8659 Personal history of other mental and behavioral disorders: Secondary | ICD-10-CM | POA: Insufficient documentation

## 2015-09-29 DIAGNOSIS — F17210 Nicotine dependence, cigarettes, uncomplicated: Secondary | ICD-10-CM | POA: Insufficient documentation

## 2015-09-29 MED ORDER — CIPROFLOXACIN-DEXAMETHASONE 0.3-0.1 % OT SUSP
3.0000 [drp] | Freq: Once | OTIC | Status: AC
  Administered 2015-09-29: 3 [drp] via OTIC
  Filled 2015-09-29: qty 7.5

## 2015-09-29 MED ORDER — IBUPROFEN 400 MG PO TABS
400.0000 mg | ORAL_TABLET | Freq: Once | ORAL | Status: AC
  Administered 2015-09-29: 400 mg via ORAL
  Filled 2015-09-29: qty 1

## 2015-09-29 NOTE — ED Provider Notes (Signed)
CSN: [id number]     Arrival date & time 09/29/15  1612 History   First MD Initiated Contact with Patient 09/29/15 1625     Chief Complaint  Patient presents with  . Otalgia     (Consider location/radiation/quality/duration/timing/severity/associated sxs/prior Treatment) Patient is a 50 y.o. male presenting with ear pain. The history is provided by the patient.  Otalgia Associated symptoms: no congestion, no cough, no fever, no headaches, no rash and no sore throat   Patient c/o pain to left ear for the past couple of days. Pain constant, dull, non radiating, moderate, worse w palp area. No hearing loss or tinnitus. No injury to area. No fb. Denies hx same. No facial pain or rash. No headache. No fever or chills.       Past Medical History  Diagnosis Date  . Anxiety   . Depression   . Bradycardia   . GERD (gastroesophageal reflux disease)   . Congenital fusion of cervical spine     c5-c6  . DDD (degenerative disc disease), cervical     c6-c7  . Alcohol abuse 11/23/2010  . Allergic rhinitis 11/07/2010  . Cocaine abuse 11/23/2010  . Tobacco abuse 11/23/2010   Past Surgical History  Procedure Laterality Date  . Cardiac catheterization     Family History  Problem Relation Age of Onset  . Stroke Paternal Aunt   . Congestive Heart Failure Paternal Grandmother     '  . Diabetes Other     maternal side of family  . Cancer      grandfather   Social History  Substance Use Topics  . Smoking status: Current Every Day Smoker -- 0.50 packs/day    Types: Cigarettes  . Smokeless tobacco: Never Used  . Alcohol Use: No     Comment: in rehab    Review of Systems  Constitutional: Negative for fever and chills.  HENT: Positive for ear pain. Negative for congestion and sore throat.   Respiratory: Negative for cough.   Skin: Negative for rash.  Neurological: Negative for headaches.      Allergies  Review of patient's allergies indicates no known allergies.  Home Medications    Prior to Admission medications   Not on File   BP 123/76 mmHg  Pulse 50  Temp(Src) 98.1 F (36.7 C) (Oral)  Resp 16  Wt 65.772 kg  SpO2 100% Physical Exam  Constitutional: He is oriented to person, place, and time. He appears well-developed and well-nourished. No distress.  HENT:  Head: Atraumatic.  Right Ear: External ear normal.  Nose: Nose normal.  Mouth/Throat: Oropharynx is clear and moist.  Left otitis externa, mild swelling noted to inferior aspect eac. Tm normal/intact. No mastoid tenderness. No skin rash.   Eyes: Conjunctivae are normal. No scleral icterus.  Neck: Neck supple. No tracheal deviation present.  Cardiovascular: Normal rate and intact distal pulses.   Pulmonary/Chest: Effort normal. No accessory muscle usage. No respiratory distress.  Musculoskeletal: Normal range of motion. He exhibits no edema.  Lymphadenopathy:    He has no cervical adenopathy.  Neurological: He is alert and oriented to person, place, and time.  Hearing grossly intact. Steady gait.   Skin: Skin is warm and dry. No rash noted. He is not diaphoretic.  Psychiatric: He has a normal mood and affect.  Nursing note and vitals reviewed.   ED Course  Procedures (including critical care time)   MDM   Exam c/w otitis externa.   Confirmed nkda w pt.  rx ciprodex  provided. Motrin po.  Reviewed nursing notes and prior charts for additional history.       Cathren Laine, MD 09/29/15 [phone]

## 2015-09-29 NOTE — Discharge Instructions (Signed)
It was our pleasure to provide your ER care today - we hope that you feel better.  Use ciprodex drops - 4 drops in left ear 2x/day for the next week.  Take motrin or aleve as need for pain.   Follow up with primary care doctor in 1 week if symptoms fail to improve/resolve.  Return to ER if worse, new symptoms, fevers, increased swelling, severe pain, other concern.    Otitis Externa Otitis externa is a bacterial or fungal infection of the outer ear canal. This is the area from the eardrum to the outside of the ear. Otitis externa is sometimes called "swimmer's ear." CAUSES  Possible causes of infection include:  Swimming in dirty water.  Moisture remaining in the ear after swimming or bathing.  Mild injury (trauma) to the ear.  Objects stuck in the ear (foreign body).  Cuts or scrapes (abrasions) on the outside of the ear. SIGNS AND SYMPTOMS  The first symptom of infection is often itching in the ear canal. Later signs and symptoms may include swelling and redness of the ear canal, ear pain, and yellowish-white fluid (pus) coming from the ear. The ear pain may be worse when pulling on the earlobe. DIAGNOSIS  Your health care provider will perform a physical exam. A sample of fluid may be taken from the ear and examined for bacteria or fungi. TREATMENT  Antibiotic ear drops are often given for 10 to 14 days. Treatment may also include pain medicine or corticosteroids to reduce itching and swelling. HOME CARE INSTRUCTIONS   Apply antibiotic ear drops to the ear canal as prescribed by your health care provider.  Take medicines only as directed by your health care provider.  If you have diabetes, follow any additional treatment instructions from your health care provider.  Keep all follow-up visits as directed by your health care provider. PREVENTION   Keep your ear dry. Use the corner of a towel to absorb water out of the ear canal after swimming or bathing.  Avoid  scratching or putting objects inside your ear. This can damage the ear canal or remove the protective wax that lines the canal. This makes it easier for bacteria and fungi to grow.  Avoid swimming in lakes, polluted water, or poorly chlorinated pools.  You may use ear drops made of rubbing alcohol and vinegar after swimming. Combine equal parts of white vinegar and alcohol in a bottle. Put 3 or 4 drops into each ear after swimming. SEEK MEDICAL CARE IF:   You have a fever.  Your ear is still red, swollen, painful, or draining pus after 3 days.  Your redness, swelling, or pain gets worse.  You have a severe headache.  You have redness, swelling, pain, or tenderness in the area behind your ear. MAKE SURE YOU:   Understand these instructions.  Will watch your condition.  Will get help right away if you are not doing well or get worse.   This information is not intended to replace advice given to you by your health care provider. Make sure you discuss any questions you have with your health care provider.   Document Released: 06/18/2005 Document Revised: 07/09/2014 Document Reviewed: 07/05/2011 Elsevier Interactive Patient Education [employer] Else[employer] Inc[employer]

## 2015-09-29 NOTE — ED Notes (Signed)
C/o left earache since last Friday-[facility] pt

## 2015-10-11 ENCOUNTER — Encounter (HOSPITAL_COMMUNITY): Payer: Self-pay | Admitting: *Deleted

## 2015-10-11 ENCOUNTER — Emergency Department (HOSPITAL_COMMUNITY)
Admission: EM | Admit: 2015-10-11 | Discharge: 2015-10-11 | Disposition: A | Discharge status: Home / Self Care | Payer: No Typology Code available for payment source | Attending: Emergency Medicine | Admitting: Emergency Medicine

## 2015-10-11 DIAGNOSIS — T783XXA Angioneurotic edema, initial encounter: Secondary | ICD-10-CM | POA: Insufficient documentation

## 2015-10-11 DIAGNOSIS — Z8719 Personal history of other diseases of the digestive system: Secondary | ICD-10-CM | POA: Insufficient documentation

## 2015-10-11 DIAGNOSIS — Y9389 Activity, other specified: Secondary | ICD-10-CM | POA: Insufficient documentation

## 2015-10-11 DIAGNOSIS — Z8739 Personal history of other diseases of the musculoskeletal system and connective tissue: Secondary | ICD-10-CM | POA: Insufficient documentation

## 2015-10-11 DIAGNOSIS — Q7649 Other congenital malformations of spine, not associated with scoliosis: Secondary | ICD-10-CM | POA: Insufficient documentation

## 2015-10-11 DIAGNOSIS — X58XXXA Exposure to other specified factors, initial encounter: Secondary | ICD-10-CM | POA: Insufficient documentation

## 2015-10-11 DIAGNOSIS — R001 Bradycardia, unspecified: Secondary | ICD-10-CM | POA: Insufficient documentation

## 2015-10-11 DIAGNOSIS — Z8659 Personal history of other mental and behavioral disorders: Secondary | ICD-10-CM | POA: Insufficient documentation

## 2015-10-11 DIAGNOSIS — Y998 Other external cause status: Secondary | ICD-10-CM | POA: Insufficient documentation

## 2015-10-11 DIAGNOSIS — Y9289 Other specified places as the place of occurrence of the external cause: Secondary | ICD-10-CM | POA: Insufficient documentation

## 2015-10-11 DIAGNOSIS — F17210 Nicotine dependence, cigarettes, uncomplicated: Secondary | ICD-10-CM | POA: Insufficient documentation

## 2015-10-11 DIAGNOSIS — Z9889 Other specified postprocedural states: Secondary | ICD-10-CM | POA: Insufficient documentation

## 2015-10-11 MED ORDER — EPINEPHRINE 0.3 MG/0.3ML IJ SOAJ: 0.3000 mg | Freq: Once | INTRAMUSCULAR | Status: DC

## 2015-10-11 MED ORDER — PREDNISONE 20 MG PO TABS
60.0000 mg | ORAL_TABLET | Freq: Once | ORAL | Status: AC
  Administered 2015-10-11: 60 mg via ORAL
  Filled 2015-10-11: qty 3

## 2015-10-11 MED ORDER — DIPHENHYDRAMINE HCL 25 MG PO CAPS
50.0000 mg | ORAL_CAPSULE | Freq: Once | ORAL | Status: AC
  Administered 2015-10-11: 50 mg via ORAL
  Filled 2015-10-11: qty 2

## 2015-10-11 MED ORDER — PREDNISONE 10 MG (21) PO TBPK: 10.0000 mg | ORAL_TABLET | Freq: Every day | ORAL | Status: DC

## 2015-10-11 NOTE — Discharge Instructions (Signed)
[name],  Nice meeting you! Please follow-up with your primary care provider and an allergist. Return to the emergency department if you develop chest pain, shortness of breath, feel like your throat is closing/itching. Feel better soon!  S. Lane HackerNicole Beyonca Wisz, PA-C Angioedema Angioedema is a sudden swelling of tissues, often of the skin. It can occur on the face or genitals or in the abdomen or other body parts. The swelling usually develops over a short period and gets better in 24 to 48 hours. It often begins during the night and is found when the person wakes up. The person may also get red, itchy patches of skin (hives). Angioedema can be dangerous if it involves swelling of the air passages.  Depending on the cause, episodes of angioedema may only happen once, come back in unpredictable patterns, or repeat for several years and then gradually fade away.  CAUSES  Angioedema can be caused by an allergic reaction to various triggers. It can also result from nonallergic causes, including reactions to drugs, immune system disorders, viral infections, or an abnormal gene that is passed to you from your parents (hereditary). For some people with angioedema, the cause is unknown.  Some things that can trigger angioedema include:   Foods.   Medicines, such as ACE inhibitors, ARBs, nonsteroidal anti-inflammatory agents, or estrogen.   Latex.   Animal saliva.   Insect stings.   Dyes used in X-rays.   Mild injury.   Dental work.  Surgery.  Stress.   Sudden changes in temperature.   Exercise. SIGNS AND SYMPTOMS   Swelling of the skin.  Hives. If these are present, there is also intense itching.  Redness in the affected area.   Pain in the affected area.  Swollen lips or tongue.  Breathing problems. This may happen if the air passages swell.  Wheezing. If internal organs are involved, there may be:   Nausea.   Abdominal pain.   Vomiting.    Difficulty swallowing.   Difficulty passing urine. DIAGNOSIS   Your health care provider will examine the affected area and take a medical and family history.  Various tests may be done to help determine the cause. Tests may include:  Allergy skin tests to see if the problem is an allergic reaction.   Blood tests to check for hereditary angioedema.   Tests to check for underlying diseases that could cause the condition.   A review of your medicines, including over-the-counter medicines, may be done. TREATMENT  Treatment will depend on the cause of the angioedema. Possible treatments include:   Removal of anything that triggered the condition (such as stopping certain medicines).   Medicines to treat symptoms or prevent attacks. Medicines given may include:   Antihistamines.   Epinephrine injection.   Steroids.   Hospitalization may be required for severe attacks. If the air passages are affected, it can be an emergency. Tubes may need to be placed to keep the airway open. HOME CARE INSTRUCTIONS   Take all medicines as directed by your health care provider.  If you were given medicines for emergency allergy treatment, always carry them with you.  Wear a medical bracelet as directed by your health care provider.   Avoid known triggers. SEEK MEDICAL CARE IF:   You have repeat attacks of angioedema.   Your attacks are more frequent or more severe despite preventive measures.   You have hereditary angioedema and are considering having children. It is important to discuss with your health  care provider the risks of passing the condition on to your children. SEEK IMMEDIATE MEDICAL CARE IF:   You have severe swelling of the mouth, tongue, or lips.  You have difficulty breathing.   You have difficulty swallowing.   You faint. MAKE SURE YOU:  Understand these instructions.  Will watch your condition.  Will get help right away if you are not doing  well or get worse.   This information is not intended to replace advice given to you by your health care provider. Make sure you discuss any questions you have with your health care provider.   Document Released: 08/27/2001 Document Revised: 07/09/2014 Document Reviewed: 02/09/2013 Elsevier Interactive Patient Education [employer]

## 2015-10-11 NOTE — ED Notes (Signed)
Pt is here with swelling to right lower lip that looks like angioedema.  No bp meds.  Pt has no tongue swelling or difficulty breathing

## 2015-10-11 NOTE — ED Provider Notes (Signed)
CSN: [id number]     Arrival date & time 10/11/15  0615 History   First MD Initiated Contact with Patient 10/11/15 [id number]     Chief Complaint  Patient presents with  . Angioedema   HPI   William Wood is a 50 y.o. male PMH significant for anxiety, bradycardia, depression, GERD, ETOH abuse, cocaine abuse presenting with right lower lip swelling since this morning. He denies fevers, chills, CP, SOB, throat/mouth swelling/numbness/tingling, abdominal pain, N/V, changes in bowel/bladder habits. He denies recent change in soaps, detergents, foods, hair products, HTN meds.   Past Medical History  Diagnosis Date  . Anxiety   . Depression   . Bradycardia   . GERD (gastroesophageal reflux disease)   . Congenital fusion of cervical spine     c5-c6  . DDD (degenerative disc disease), cervical     c6-c7  . Alcohol abuse 11/23/2010  . Allergic rhinitis 11/07/2010  . Cocaine abuse 11/23/2010  . Tobacco abuse 11/23/2010   Past Surgical History  Procedure Laterality Date  . Cardiac catheterization     Family History  Problem Relation Age of Onset  . Stroke Paternal Aunt   . Congestive Heart Failure Paternal Grandmother     '  . Diabetes Other     maternal side of family  . Cancer      grandfather   Social History  Substance Use Topics  . Smoking status: Current Every Day Smoker -- 0.50 packs/day    Types: Cigarettes  . Smokeless tobacco: Never Used  . Alcohol Use: Yes     Comment: in rehab    Review of Systems  Ten systems are reviewed and are negative for acute change except as noted in the HPI  Allergies  Review of patient's allergies indicates no known allergies.  Home Medications   Prior to Admission medications   Not on File   BP 133/72 mmHg  Pulse 64  Temp(Src) 98.4 F (36.9 C) (Oral)  Resp 18  SpO2 100% Physical Exam  Constitutional: He appears well-developed and well-nourished. No distress.  HENT:  Head: Normocephalic and atraumatic.  Right Ear: External ear  normal.  Left Ear: External ear normal.  Nose: Nose normal.  Mouth/Throat: Oropharynx is clear and moist. No oropharyngeal exudate.  Airway intact. Lower lip edema, isolated to right side only  Eyes: Conjunctivae are normal. Right eye exhibits no discharge. Left eye exhibits no discharge. No scleral icterus.  Neck: No tracheal deviation present.  Cardiovascular: Regular rhythm, normal heart sounds and intact distal pulses.  Exam reveals no gallop and no friction rub.   No murmur heard. Bradycardia  Pulmonary/Chest: Effort normal and breath sounds normal. No stridor. No respiratory distress. He has no wheezes. He has no rales. He exhibits no tenderness.  Abdominal: Soft. Bowel sounds are normal. He exhibits no distension and no mass. There is no tenderness. There is no rebound and no guarding.  Musculoskeletal: He exhibits no edema.  Lymphadenopathy:    He has no cervical adenopathy.  Neurological: He is alert. Coordination normal.  Skin: Skin is warm and dry. No rash noted. He is not diaphoretic. No erythema.  Psychiatric: He has a normal mood and affect. His behavior is normal.  Nursing note and vitals reviewed.   ED Course  Procedures  MDM   Final diagnoses:  Angioedema, initial encounter   Patient nontoxic appearing, VSS. Most likely angioedema. No signs of infectious etiology.  Patient re-evaluated prior to dc, is hemodynamically stable, in no respiratory distress,  and denies the feeling of throat closing. Pt has been advised to take OTC benadryl & return to the ED if they have a mod-severe allergic rxn (s/s including throat closing, difficulty breathing, swelling of lips face or tongue). Patient d/c'd with epipen and steroid taper. Pt is to follow up with their PCP. Pt is agreeable with plan & verbalizes understanding.   Melton Krebs, PA-C 10/15/15 1610  Derwood Kaplan, MD 10/16/15 1400

## 2016-01-13 ENCOUNTER — Encounter (HOSPITAL_COMMUNITY): Payer: Self-pay

## 2016-01-13 ENCOUNTER — Emergency Department (HOSPITAL_COMMUNITY)
Admission: EM | Admit: 2016-01-13 | Discharge: 2016-01-13 | Disposition: A | Discharge status: Home / Self Care | Payer: No Typology Code available for payment source | Attending: Emergency Medicine | Admitting: Emergency Medicine

## 2016-01-13 DIAGNOSIS — Y929 Unspecified place or not applicable: Secondary | ICD-10-CM | POA: Insufficient documentation

## 2016-01-13 DIAGNOSIS — Z79899 Other long term (current) drug therapy: Secondary | ICD-10-CM | POA: Insufficient documentation

## 2016-01-13 DIAGNOSIS — Y9389 Activity, other specified: Secondary | ICD-10-CM | POA: Insufficient documentation

## 2016-01-13 DIAGNOSIS — F17210 Nicotine dependence, cigarettes, uncomplicated: Secondary | ICD-10-CM | POA: Insufficient documentation

## 2016-01-13 DIAGNOSIS — W311XXA Contact with metalworking machines, initial encounter: Secondary | ICD-10-CM | POA: Insufficient documentation

## 2016-01-13 DIAGNOSIS — IMO0002 Reserved for concepts with insufficient information to code with codable children: Secondary | ICD-10-CM

## 2016-01-13 DIAGNOSIS — S61512A Laceration without foreign body of left wrist, initial encounter: Secondary | ICD-10-CM | POA: Insufficient documentation

## 2016-01-13 DIAGNOSIS — Y999 Unspecified external cause status: Secondary | ICD-10-CM | POA: Insufficient documentation

## 2016-01-13 MED ORDER — LIDOCAINE HCL 2 % IJ SOLN: 20.0000 mL | Freq: Once | INTRAMUSCULAR | Status: DC

## 2016-01-13 MED ORDER — TETANUS-DIPHTH-ACELL PERTUSSIS 5-2.5-18.5 LF-MCG/0.5 IM SUSP
0.5000 mL | Freq: Once | INTRAMUSCULAR | Status: AC
  Administered 2016-01-13: 0.5 mL via INTRAMUSCULAR
  Filled 2016-01-13: qty 0.5

## 2016-01-13 MED ORDER — LIDOCAINE HCL (PF) 2 % IJ SOLN
10.0000 mL | Freq: Once | INTRAMUSCULAR | Status: AC
  Administered 2016-01-13: 10 mL
  Filled 2016-01-13: qty 10

## 2016-01-13 NOTE — ED Notes (Signed)
PA at bedside to perform suture care. Pt tolerated without difficulty. No signs of distress noted. Wound covered with antibiotic ointment and dressing. Wound care discussed. Pt has no further questions at the time.

## 2016-01-13 NOTE — Discharge Instructions (Signed)
Keep wound clean with mild soap and water. Keep area covered with a topical antibiotic ointment and bandage, keep bandage dry, and do not submerge in water for 24 hours. Ice and elevate for additional pain relief and swelling. Alternate between ibuprofen and Tylenol for additional pain relief. Follow up with your primary care doctor or the [facility] in approximately 7 days for wound recheck and suture removal. Monitor area for signs of infection to include, but not limited to: increasing pain, spreading redness, drainage/pus, worsening swelling, or fevers. Return to emergency department for emergent changing or worsening symptoms. ° ° °WOUND CARE °· Keep area clean and dry for 24 hours. Do not remove bandage, if applied. °· After 24 hours,you should change it at least once a day. Also, change the dressing if it becomes wet or dirty, or as directed by your caregiver.  °· Wash the wound with soap and water 2 times a day. Rinse the wound off with water to remove all soap. Pat the wound dry with a clean towel.  °· You may shower as usual after the first 24 hours. Do not soak the wound in water until the sutures are removed.  °· Once the wound has healed, scarring can be minimized by covering the wound with sunscreen during the day for 1 full year. °· Do not apply any ointments or creams to the wound while stitches/staples are in place, as this may cause delayed healing. °· Return if you experience any of the following signs of infection: Swelling, redness, pus drainage, streaking, fever >101.0 F °· Return if you experience excessive bleeding that does not stop after 15-20 minutes of constant, firm pressure. °

## 2016-01-13 NOTE — ED Notes (Signed)
Pt here for laceration to the left wrist. Wrapped by Nurse First. States he cut it with a box cutter trying to fix a toilet.

## 2016-01-13 NOTE — ED Provider Notes (Signed)
CSN: [id number]     Arrival date & time 01/13/16  1145 History  By signing my name below, I, William Wood, attest that this documentation has been prepared under the direction and in the presence of [facility] HospitalJaime Ward, PA-C. Electronically Signed: Phillis HaggisGabriella Wood, ED Scribe. 01/13/2016. 12:35 PM.   Chief Complaint  Patient presents with  . Laceration   The history is provided by the patient. No language interpreter was used.  HPI Comments: William Wood is a 50 y.o. male with a hx of substance abuse who presents to the Emergency Department complaining of a laceration to the left wrist onset PTA. Pt reports that he was working on metal equipment when it cut his wrist. Pt is not UTD on tdap. He reports worsening pain with movement but is able to move all of his fingers and wrist. Bleeding is controlled with a wrap around the wrist in triage. He denies pallor, numbness, or weakness.   Past Medical History  Diagnosis Date  . Anxiety   . Depression   . Bradycardia   . GERD (gastroesophageal reflux disease)   . Congenital fusion of cervical spine     c5-c6  . DDD (degenerative disc disease), cervical     c6-c7  . Alcohol abuse 11/23/2010  . Allergic rhinitis 11/07/2010  . Cocaine abuse 11/23/2010  . Tobacco abuse 11/23/2010   Past Surgical History  Procedure Laterality Date  . Cardiac catheterization     Family History  Problem Relation Age of Onset  . Stroke Paternal Aunt   . Congestive Heart Failure Paternal Grandmother     '  . Diabetes Other     maternal side of family  . Cancer      grandfather   Social History  Substance Use Topics  . Smoking status: Current Every Day Smoker -- 0.50 packs/day    Types: Cigarettes  . Smokeless tobacco: Never Used  . Alcohol Use: Yes     Comment: in rehab    Review of Systems  Musculoskeletal: Positive for arthralgias.  Skin: Positive for wound. Negative for pallor.  Neurological: Negative for weakness and numbness.  All other systems reviewed  and are negative.   Allergies  Review of patient's allergies indicates no known allergies.  Home Medications   Prior to Admission medications   Medication Sig Start Date End Date Taking? Authorizing Provider  EPINEPHrine 0.3 mg/0.3 mL IJ SOAJ injection Inject 0.3 mLs (0.3 mg total) into the muscle once. 10/11/15   Melton KrebsSamantha Nicole Riley, PA-C  predniSONE (STERAPRED UNI-PAK 21 TAB) 10 MG (21) TBPK tablet Take 1 tablet (10 mg total) by mouth daily. Take 6 tabs by mouth daily  for 2 days, then 5 tabs for 2 days, then 4 tabs for 2 days, then 3 tabs for 2 days, 2 tabs for 2 days, then 1 tab by mouth daily for 2 days 10/11/15   Melton KrebsSamantha Nicole Riley, PA-C   BP 129/71 mmHg  Pulse 65  Temp(Src) 98.4 F (36.9 C) (Oral)  Resp 18  SpO2 97% Physical Exam  Constitutional: He is oriented to person, place, and time. He appears well-developed and well-nourished.  HENT:  Head: Normocephalic and atraumatic.  Neck: Normal range of motion. Neck supple.  Cardiovascular: Normal rate and regular rhythm.   2+ radial pulse of LUE  Pulmonary/Chest: Effort normal and breath sounds normal. No respiratory distress.  Musculoskeletal: Normal range of motion.  Full ROM of wrist and all digits. Able to cross fingers as well. 5/5 grip  strength.   Neurological: He is alert and oriented to person, place, and time.  LUE sensation intact.   Skin: Skin is warm and dry.  3 cm laceration to left wrist.  Nursing note and vitals reviewed.   ED Course  Procedures (including critical care time) DIAGNOSTIC STUDIES: Oxygen Saturation is 97% on RA, normal by my interpretation.    COORDINATION OF CARE: 12:35 PM-Discussed treatment plan which includes laceration repair with pt at bedside and pt agreed to plan.   LACERATION REPAIR Performed by: Elizabeth Sauer, PA-C. Consent: Verbal consent obtained. Risks and benefits: risks, benefits and alternatives were discussed Patient identity confirmed: provided demographic data Time  out performed prior to procedure Prepped and Draped in normal sterile fashion Wound explored Laceration Location: left wrist Laceration Length: 3cm No Foreign Bodies seen or palpated Anesthesia: local infiltration Local anesthetic: lidocaine 2% Anesthetic total: 5 ml Irrigation method: syringe Amount of cleaning: standard Skin closure: 4-0 Nylon Number of sutures or staples: 6 Technique: simple interrupted Patient tolerance: Patient tolerated the procedure well with no immediate complications.   Labs Review Labs Reviewed - No data to display  Imaging Review No results found. I have personally reviewed and evaluated these images and lab results as part of my medical decision-making.   EKG Interpretation None      MDM     Final diagnoses:  Laceration   William Wood presents to ED for laceration just prior to arrival. Wound cleaned and explored. Bottom of wound seen in a bloodless field. No tendon or muscle involvement. LUE is NVI.  Laceration repaired as dictated above. Tetanus updated. Patient counseled on home wound care. Follow up with PCP/urgent care or return to ER for suture removal in 7 days. Patient was urged to return to the Emergency Department for worsening pain, swelling, expanding erythema especially if it streaks away from the affected area, fever, or for any additional concerns. Patient verbalized understanding. All questions answered.   I personally performed the services described in this documentation, which was scribed in my presence. The recorded information has been reviewed and is accurate.    [facility] Ward, PA-C 01/13/16 1338  Derwood Kaplan, MD 01/15/16 [phone]

## 2016-01-13 NOTE — ED Notes (Signed)
Laceration noted to L wrist. Bleeding upon arrival. Pt states he was working on car and accidentally cut L wrist with piece of metal.

## 2016-01-13 NOTE — ED Notes (Signed)
Pt had a 2 inch almost straight lined laceration to the anterior wrist area from a piece of equipment he was using.  Pt could move fingers and you could see slight area of tendon. Bleeding controlled and wrapped at triage.

## 2016-07-14 IMAGING — US US SCROTUM
1 series · 14 of 25 positions shown · non-contrast
Comparison: No prior.

CLINICAL DATA: Bilateral testicular enlargement .

EXAM:
SCROTAL ULTRASOUND
DOPPLER ULTRASOUND OF THE TESTICLES
TECHNIQUE: Complete ultrasound examination of the testicles, epididymis, and
other scrotal structures was performed. Color and spectral Doppler
ultrasound were also utilized to evaluate blood flow to the
testicles.

[Series 1: us scrotum · 0.07mm/px · 14 of 53 slices shown]
[im 1/53]
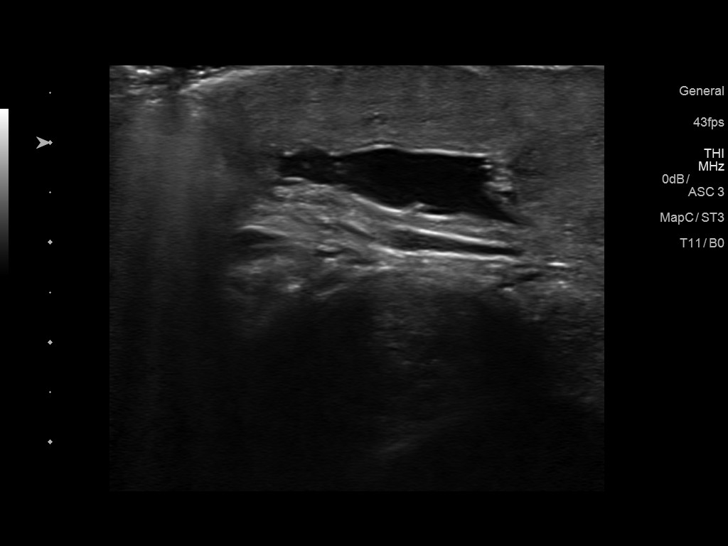
[im 5/53]
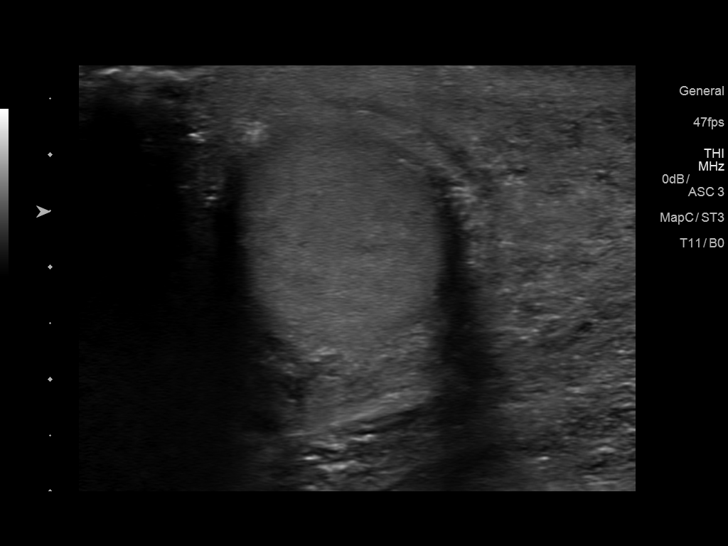
[im 9/53]
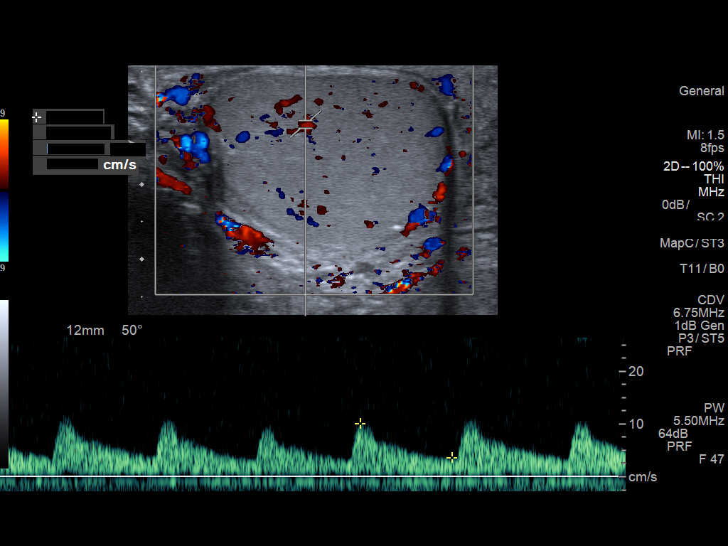
[im 14/53]
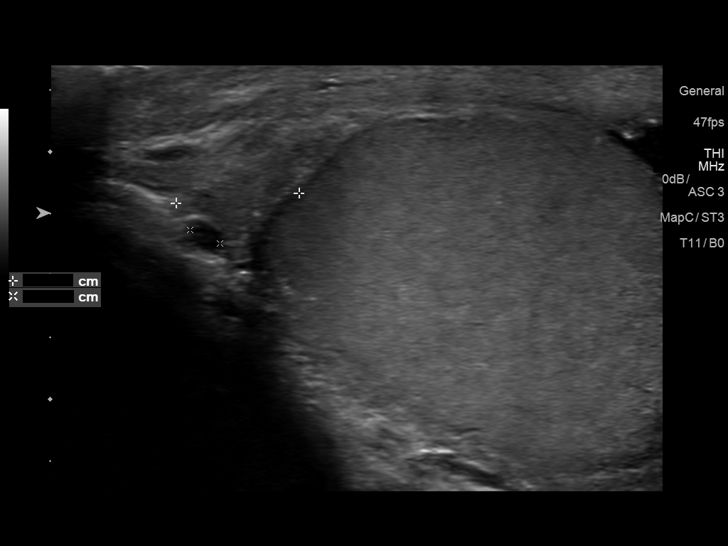
[im 18/53]
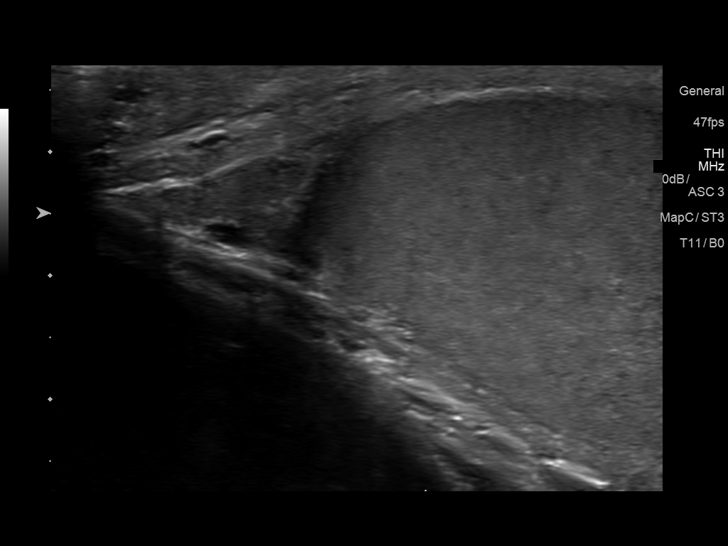
[im 20/53]
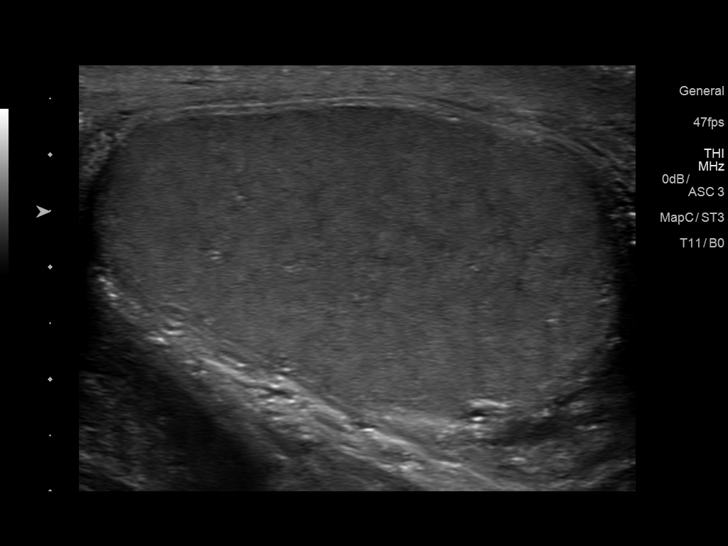
[im 24/53]
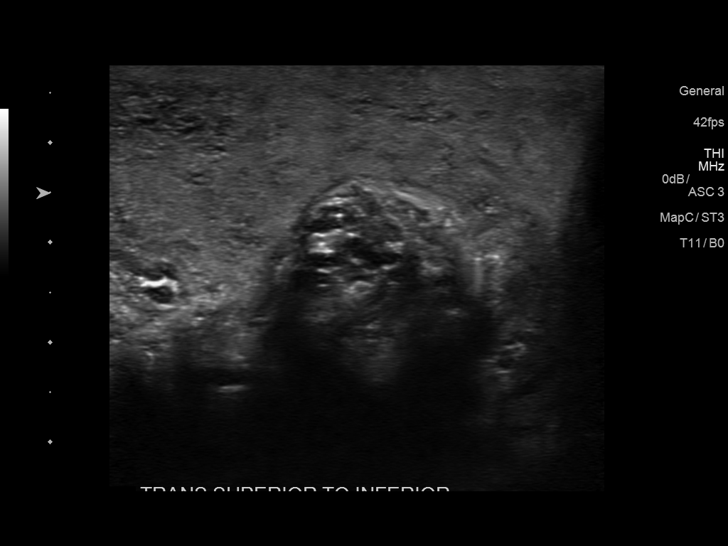
[im 29/53]
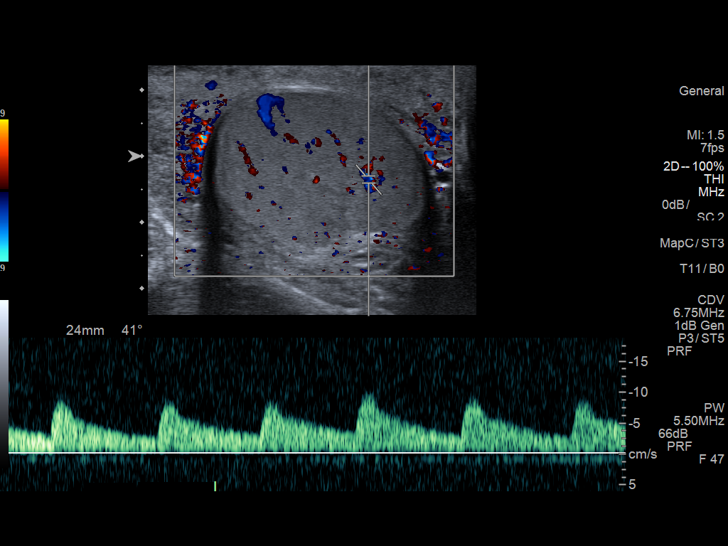
[im 33/53]
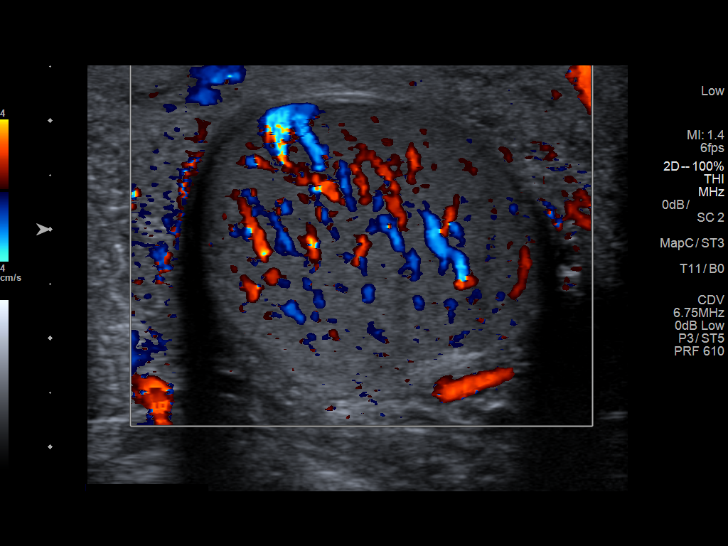
[im 35/53]
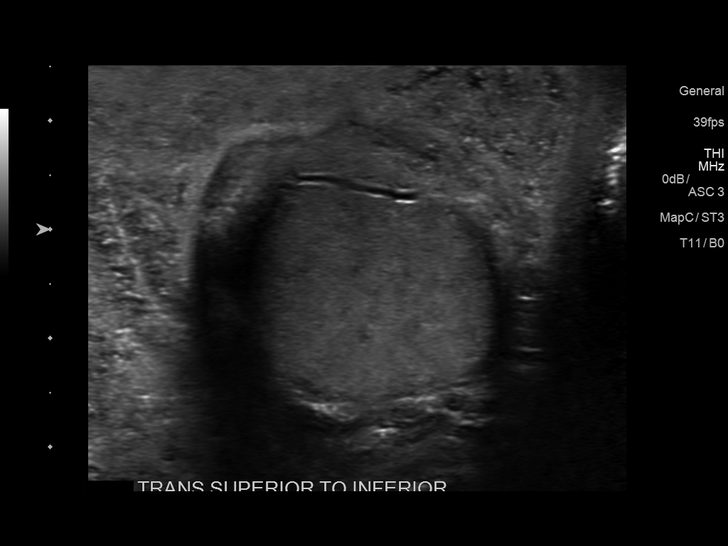
[im 40/53]
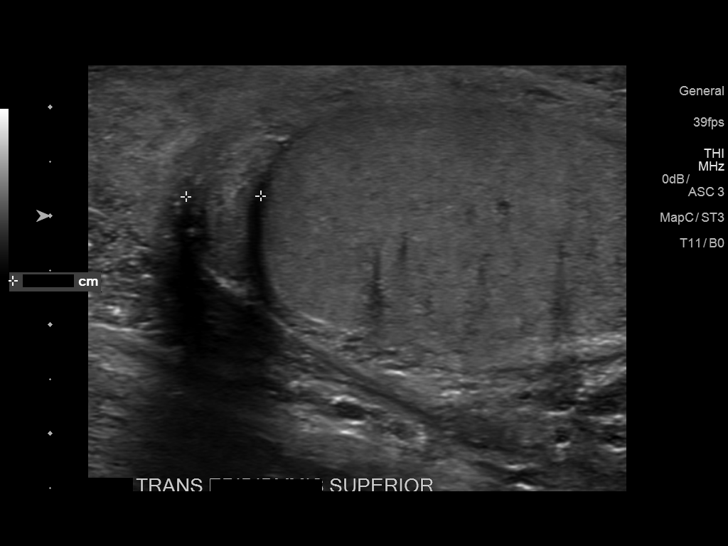
[im 44/53]
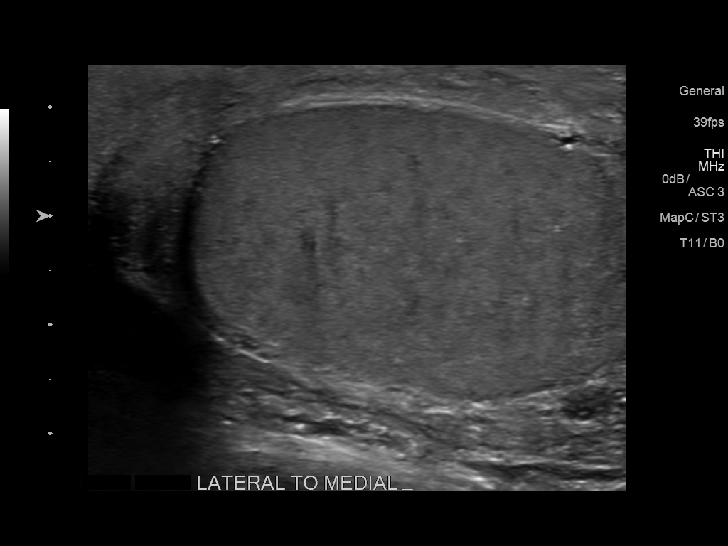
[im 48/53]
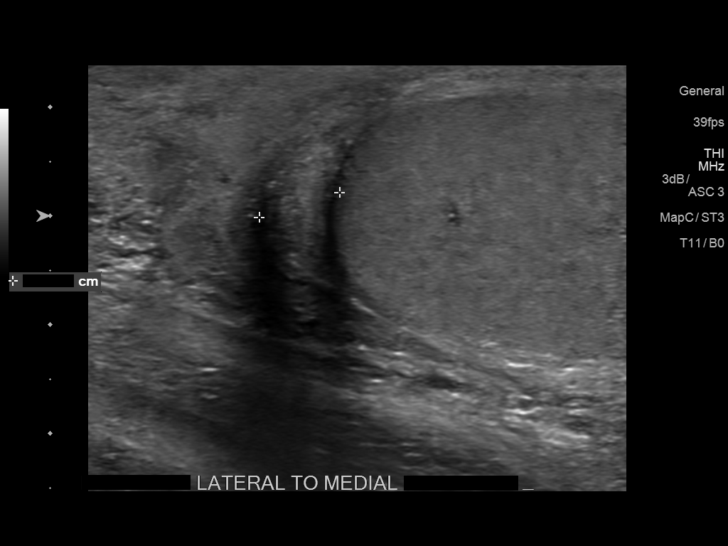
[im 53/53]
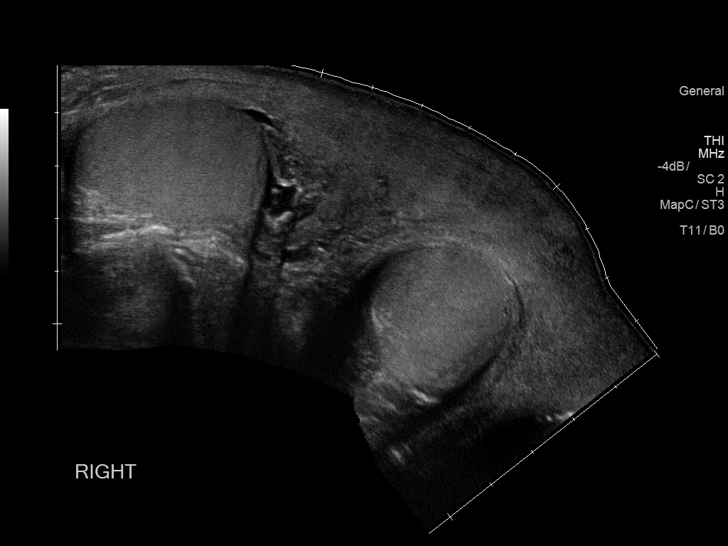

[14 of 25 positions shown; findings below may reference images not displayed]

FINDINGS: Right testicle

Measurements: 4.9 x 2.8 x 3.2 cm. No mass or microlithiasis
visualized.

Left testicle

Measurements: 4.6 x 2.7 x 3.2 cm. No mass or microlithiasis
visualized.

Right epididymis:  5 mm simple cyst.

Left epididymis:  Normal in size and appearance.

Hydrocele: Small right hydrocele. Mild scrotal thickening is
present.

Varicocele:  None visualized.

Pulsed Doppler interrogation of both testes demonstrates normal low
resistance arterial and venous waveforms bilaterally.
IMPRESSION: 1. Small right hydrocele.
2. Scrotal skin thickening. No evidence of testicular mass or
torsion.

## 2017-02-19 ENCOUNTER — Ambulatory Visit: Payer: Self-pay

## 2017-08-12 ENCOUNTER — Ambulatory Visit: Payer: Self-pay | Admitting: Internal Medicine

## 2017-08-19 ENCOUNTER — Ambulatory Visit: Payer: Self-pay | Admitting: Internal Medicine

## 2017-09-01 ENCOUNTER — Emergency Department (HOSPITAL_COMMUNITY)
Admission: EM | Admit: 2017-09-01 | Discharge: 2017-09-01 | Disposition: A | Discharge status: Home / Self Care | Payer: Self-pay | Attending: Emergency Medicine | Admitting: Emergency Medicine

## 2017-09-01 ENCOUNTER — Encounter (HOSPITAL_COMMUNITY): Payer: Self-pay

## 2017-09-01 DIAGNOSIS — F1410 Cocaine abuse, uncomplicated: Secondary | ICD-10-CM | POA: Insufficient documentation

## 2017-09-01 DIAGNOSIS — R442 Other hallucinations: Secondary | ICD-10-CM

## 2017-09-01 DIAGNOSIS — Z653 Problems related to other legal circumstances: Secondary | ICD-10-CM

## 2017-09-01 DIAGNOSIS — Z590 Homelessness: Secondary | ICD-10-CM

## 2017-09-01 DIAGNOSIS — F333 Major depressive disorder, recurrent, severe with psychotic symptoms: Secondary | ICD-10-CM | POA: Insufficient documentation

## 2017-09-01 DIAGNOSIS — F32A Depression, unspecified: Secondary | ICD-10-CM

## 2017-09-01 DIAGNOSIS — F329 Major depressive disorder, single episode, unspecified: Secondary | ICD-10-CM

## 2017-09-01 DIAGNOSIS — F332 Major depressive disorder, recurrent severe without psychotic features: Secondary | ICD-10-CM

## 2017-09-01 DIAGNOSIS — Z630 Problems in relationship with spouse or partner: Secondary | ICD-10-CM

## 2017-09-01 DIAGNOSIS — F17210 Nicotine dependence, cigarettes, uncomplicated: Secondary | ICD-10-CM | POA: Insufficient documentation

## 2017-09-01 DIAGNOSIS — F1010 Alcohol abuse, uncomplicated: Secondary | ICD-10-CM | POA: Insufficient documentation

## 2017-09-01 DIAGNOSIS — R45851 Suicidal ideations: Secondary | ICD-10-CM | POA: Insufficient documentation

## 2017-09-01 LAB — SALICYLATE LEVEL

## 2017-09-01 LAB — COMPREHENSIVE METABOLIC PANEL
ALBUMIN: 4.4 g/dL (ref 3.5–5.0)
ALT: 26 U/L (ref 17–63)
ANION GAP: 15 (ref 5–15)
AST: 31 U/L (ref 15–41)
Alkaline Phosphatase: 49 U/L (ref 38–126)
BILIRUBIN TOTAL: 0.7 mg/dL (ref 0.3–1.2)
BUN: 13 mg/dL (ref 6–20)
CO2: 19 mmol/L — ABNORMAL LOW (ref 22–32)
Calcium: 9 mg/dL (ref 8.9–10.3)
Chloride: 103 mmol/L (ref 101–111)
Creatinine, Ser: 0.97 mg/dL (ref 0.61–1.24)
GFR calc non Af Amer: >60 mL/min (ref >60)
GLUCOSE: 70 mg/dL (ref 65–99)
POTASSIUM: 3.5 mmol/L (ref 3.5–5.1)
Sodium: 137 mmol/L (ref 135–145)
TOTAL PROTEIN: 7 g/dL (ref 6.5–8.1)

## 2017-09-01 LAB — CBC
HEMATOCRIT: 39.7 % (ref 39.0–52.0)
Hemoglobin: 13.6 g/dL (ref 13.0–17.0)
MCH: 29.1 pg (ref 26.0–34.0)
MCHC: 34.3 g/dL (ref 30.0–36.0)
MCV: 84.8 fL (ref 78.0–100.0)
Platelets: 226 10*3/uL (ref 150–400)
RBC: 4.68 MIL/uL (ref 4.22–5.81)
RDW: 15.4 % (ref 11.5–15.5)
WBC: 11.8 10*3/uL — AB (ref 4.0–10.5)

## 2017-09-01 LAB — ETHANOL: Alcohol, Ethyl (B): 81 mg/dL — ABNORMAL HIGH (ref <10)

## 2017-09-01 LAB — ACETAMINOPHEN LEVEL

## 2017-09-01 NOTE — ED Notes (Signed)
Lab at bedside

## 2017-09-01 NOTE — ED Notes (Signed)
Patient denies pain and is resting comfortably.  

## 2017-09-01 NOTE — ED Notes (Signed)
Pt reports he was unable to urinate.
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
## 2017-09-01 NOTE — ED Provider Notes (Signed)
TIME SEEN: 4:12 AM  CHIEF COMPLAINT: Suicidal thoughts  HPI: Patient is a 52 year old male with history of substance abuse who presents to the emergency department with suicidal thoughts.  States for the past 2 weeks he has been feeling like "I just do not want to live".  No active plan.  No HI or hallucinations.  Reports drinking alcohol and using crack cocaine.  States he has been drinking and using crack today.  He has no current complaints of pain, fever, cough, vomiting or diarrhea.  Has never had a suicide attempt before.  Has never had a psychiatric hospitalization before.  ROS: See HPI Constitutional: no fever  Eyes: no drainage  ENT: no runny nose   Cardiovascular:  no chest pain  Resp: no SOB  GI: no vomiting GU: no dysuria Integumentary: no rash  Allergy: no hives  Musculoskeletal: no leg swelling  Neurological: no slurred speech ROS otherwise negative  PAST MEDICAL HISTORY/PAST SURGICAL HISTORY:  Past Medical History:  Diagnosis Date  . Alcohol abuse 11/23/2010  . Allergic rhinitis 11/07/2010  . Anxiety   . Bradycardia   . Cocaine abuse ([facility]) 11/23/2010  . Congenital fusion of cervical spine    c5-c6  . DDD (degenerative disc disease), cervical    c6-c7  . Depression   . GERD (gastroesophageal reflux disease)   . Tobacco abuse 11/23/2010    MEDICATIONS:  Prior to Admission medications   Medication Sig Start Date End Date Taking? Authorizing Provider  EPINEPHrine 0.3 mg/0.3 mL IJ SOAJ injection Inject 0.3 mLs (0.3 mg total) into the muscle once. 10/11/15   Melton Krebsiley, Samantha Nicole, PA-C  predniSONE (STERAPRED UNI-PAK 21 TAB) 10 MG (21) TBPK tablet Take 1 tablet (10 mg total) by mouth daily. Take 6 tabs by mouth daily  for 2 days, then 5 tabs for 2 days, then 4 tabs for 2 days, then 3 tabs for 2 days, 2 tabs for 2 days, then 1 tab by mouth daily for 2 days 10/11/15   Melton Krebsiley, Samantha Nicole, PA-C    ALLERGIES:  No Known Allergies  SOCIAL HISTORY:  Social History    Tobacco Use  . Smoking status: Current Every Day Smoker    Packs/day: 0.50    Types: Cigarettes  . Smokeless tobacco: Never Used  Substance Use Topics  . Alcohol use: Yes    Comment: in rehab    FAMILY HISTORY: Family History  Problem Relation Age of Onset  . Stroke Paternal Aunt   . Congestive Heart Failure Paternal Grandmother        '  . Diabetes Other        maternal side of family  . Cancer Unknown        grandfather    EXAM: BP 131/85   Pulse 85   Temp 97.9 F (36.6 C) (Oral)   Resp 18   Ht 5\' 5"  (1.651 m)   Wt 61.7 kg (136 lb)   SpO2 99%   BMI 22.63 kg/m  CONSTITUTIONAL: Alert and oriented and responds appropriately to questions. Well-appearing; well-nourished HEAD: Normocephalic EYES: Conjunctivae clear, pupils appear equal, EOMI ENT: normal nose; moist mucous membranes NECK: Supple, no meningismus, no nuchal rigidity, no LAD  CARD: RRR; S1 and S2 appreciated; no murmurs, no clicks, no rubs, no gallops RESP: Normal chest excursion without splinting or tachypnea; breath sounds clear and equal bilaterally; no wheezes, no rhonchi, no rales, no hypoxia or respiratory distress, speaking full sentences ABD/GI: Normal bowel sounds; non-distended; soft, non-tender, no rebound,  no guarding, no peritoneal signs, no hepatosplenomegaly BACK:  The back appears normal and is non-tender to palpation, there is no CVA tenderness EXT: Normal ROM in all joints; non-tender to palpation; no edema; normal capillary refill; no cyanosis, no calf tenderness or swelling    SKIN: Normal color for age and race; warm; no rash NEURO: Moves all extremities equally PSYCH: Patient endorses SI without plan.  Has flat affect.  No HI.  No hallucinations.  MEDICAL DECISION MAKING: Patient here with suicidal thoughts without plan.  Labs show alcohol of 81.  Otherwise unremarkable.  At this time he is medically cleared.  Will consult TTS.  ED PROGRESS: 5:30 AM  D/w Ala Dach with [facility].  Plan is to  have psychiatry re-eval later today.  I feel this is appropriate.  Patient here voluntarily.    I reviewed all nursing notes, vitals, pertinent previous records, EKGs, lab and urine results, imaging (as available).      Octavion Mollenkopf, Layla Maw, DO 09/01/17 [phone]

## 2017-09-01 NOTE — Progress Notes (Addendum)
Per Leighton Ruffina Okonkwo NP, pt is psychiatrically cleared. Toula MoosKayleigh RN notified and CSW faxed outpatient resources for pt to fax number: [phone].  Ky-Sha Precious Gildinguncan LCSWA, LCASA 09/01/2017 11:57 AM

## 2017-09-01 NOTE — ED Notes (Signed)
Pt aware that we need a urine sample. Urinal at bedside.

## 2017-09-01 NOTE — ED Notes (Signed)
ED Provider at bedside. 

## 2017-09-01 NOTE — BH Assessment (Addendum)
Tele Assessment Note   Patient Name: William Wood MRN: [id number] Referring Physician: Rochele Raring, DO Location of Patient: [facility] Location of Provider: Behavioral Health TTS Department  William Wood is an 52 y.o. single male who presents unaccompanied to [facility] ED requesting treatment for symptoms of depression and abuse of alcohol and cocaine. Pt was very drowsy during assessment and a poor historian. He states he uses at least $20 worth of crack several times per week when available and last used yesterday. He also reports drinking alcohol to intoxication every two weeks or when available. He acknowledges he feels depressed due to his inability to maintain sobriety. He reports current suicidal ideation with no plan, stating to the RN in triage that he doesn't want to live anymore and doesn't want to wake up. Pt denies any history of suicide attempts or intentional self-injurious behavior. When asked if he has experienced hallucinations Pt says "I don't know" and told the RN in triage that he hears the devil telling him he is worthless. Pt denies current homicidal ideation or history of violence.   Pt identifies consequences of his substance use as his primary stressor. He says he is currently living in a homeless shelter. He reports a recent conflict with his ex-girlfriend. He states he lost his license and has a court date pending regarding community service related to a DWI. He states he has three adult children but cannot identify anyone in his life who is supportive.   He states he is current not seeing a psychiatrist or a therapist. He reports he has been prescribed psychiatric medication in the past. He says he has been in drug treatment before and was at [facility] approximately one month ago.  Pt is dressed in hospital scrubs, alert and very drowsy, falling asleep repeatedly during assessment. Pt speaks in a mumbled tone, at low volume and slow pace. Motor behavior  appears normal. Eye contact is minimal and Pt kept his eyes closed. Pt's mood is depressed and affect is congruent with mood. Thought process is coherent and relevant. There is no indication Pt is currently responding to internal stimuli or experiencing delusional thought content. Pt was generally cooperative and says he is willing to sign voluntarily into a psychiatric facility.   Diagnosis:  F33.3 Major Depressive Disorder, Recurrent, Severe Without Psychotic Features F10.20 Alcohol Use Disorder, Severe F14.20 Cocaine Use Disorder, Severe  Past Medical History:  Past Medical History:  Diagnosis Date  . Alcohol abuse 11/23/2010  . Allergic rhinitis 11/07/2010  . Anxiety   . Bradycardia   . Cocaine abuse (HCC) 11/23/2010  . Congenital fusion of cervical spine    c5-c6  . DDD (degenerative disc disease), cervical    c6-c7  . Depression   . GERD (gastroesophageal reflux disease)   . Tobacco abuse 11/23/2010    Past Surgical History:  Procedure Laterality Date  . CARDIAC CATHETERIZATION      Family History:  Family History  Problem Relation Age of Onset  . Stroke Paternal Aunt   . Congestive Heart Failure Paternal Grandmother        '  . Diabetes Other        maternal side of family  . Cancer Unknown        grandfather    Social History:  reports that he has been smoking cigarettes.  He has been smoking about 0.50 packs per day. he has never used smokeless tobacco. He reports that he drinks alcohol. He  reports that he uses drugs. Drugs: "Crack" cocaine, Cocaine, and Marijuana.  Additional Social History:  Alcohol / Drug Use Pain Medications: See MAR Prescriptions: See MAR Over the Counter: See MAR History of alcohol / drug use?: Yes Longest period of sobriety (when/how long): 45 days Negative Consequences of Use: Financial, Legal, Personal relationships, Work / School Withdrawal Symptoms: (Pt denies) Substance #1 Name of Substance 1: Alcohol 1 - Age of First Use:  Adolescent 1 - Amount (size/oz): At least one 40-ounce beer 1 - Frequency: Every two weeks 1 - Duration: Ongoing for years 1 - Last Use / Amount: 08/31/17 Substance #2 Name of Substance 2: Cocaine 2 - Age of First Use: 20 2 - Amount (size/oz): Over $20 worth 2 - Frequency: 2-3 times per week 2 - Duration: Ongoing 2 - Last Use / Amount: 08/28/17  CIWA: CIWA-Ar BP: 131/85 Pulse Rate: 85 COWS:    Allergies: No Known Allergies  Home Medications:  (Not in a hospital admission)  OB/GYN Status:  No LMP for male patient.  General Assessment Data Location of Assessment: [facility] ED TTS Assessment: In system Is this a Tele or Face-to-Face Assessment?: Tele Assessment Is this an Initial Assessment or a Re-assessment for this encounter?: Initial Assessment Marital status: Single Maiden name: NA Is patient pregnant?: No Pregnancy Status: No Living Arrangements: Other (Comment)(Homeless shelter) Can pt return to current living arrangement?: Yes Admission Status: Voluntary Is patient capable of signing voluntary admission?: Yes Referral Source: Self/Family/Friend Insurance type: Self-pay     Crisis Care Plan Living Arrangements: Other (Comment)(Homeless shelter) Legal Guardian: Other:(Self) Name of Psychiatrist: None Name of Therapist: None  Education Status Is patient currently in school?: No Current Grade: NA Highest grade of school patient has completed: NA Name of school: NA Contact person: NA  Risk to self with the past 6 months Suicidal Ideation: Yes-Currently Present Has patient been a risk to self within the past 6 months prior to admission? : Yes Suicidal Intent: No Has patient had any suicidal intent within the past 6 months prior to admission? : No Is patient at risk for suicide?: Yes Suicidal Plan?: No Has patient had any suicidal plan within the past 6 months prior to admission? : No Access to Means: No What has been your use of drugs/alcohol within the last  12 months?: Pt reports using alcohol and cocaine Previous Attempts/Gestures: No How many times?: 0 Other Self Harm Risks: None Triggers for Past Attempts: None known Intentional Self Injurious Behavior: None Family Suicide History: No Recent stressful life event(s): Legal Issues, Other (Comment)(Homeless) Persecutory voices/beliefs?: No Depression: Yes Depression Symptoms: Despondent, Fatigue, Guilt, Loss of interest in usual pleasures, Feeling worthless/self pity Substance abuse history and/or treatment for substance abuse?: Yes Suicide prevention information given to non-admitted patients: Not applicable  Risk to Others within the past 6 months Homicidal Ideation: No Does patient have any lifetime risk of violence toward others beyond the six months prior to admission? : No Thoughts of Harm to Others: No Current Homicidal Intent: No Current Homicidal Plan: No Access to Homicidal Means: No Identified Victim: None History of harm to others?: No Assessment of Violence: None Noted Violent Behavior Description: Pt denies history of violence Does patient have access to weapons?: No Criminal Charges Pending?: No Does patient have a court date: Yes Court Date: (unknown) Is patient on probation?: Yes  Psychosis Hallucinations: None noted Delusions: None noted  Mental Status Report Appearance/Hygiene: In scrubs Eye Contact: Poor Motor Activity: Unremarkable Speech: Logical/coherent Level of Consciousness:  Drowsy Mood: Depressed Affect: Depressed Anxiety Level: None Thought Processes: Coherent, Relevant Judgement: Partial Orientation: Person, Place, Time, Situation Obsessive Compulsive Thoughts/Behaviors: None  Cognitive Functioning Concentration: Decreased Memory: Recent Intact, Remote Intact IQ: Average Insight: Fair Impulse Control: Fair Appetite: Fair Weight Loss: 0 Weight Gain: 0 Sleep: No Change Total Hours of Sleep: 8 Vegetative Symptoms: None  ADLScreening  [facility] Assessment Services) Patient's cognitive ability adequate to safely complete daily activities?: Yes Patient able to express need for assistance with ADLs?: Yes Independently performs ADLs?: Yes (appropriate for developmental age)  Prior Inpatient Therapy Prior Inpatient Therapy: Yes Prior Therapy Dates: 08/2017 Prior Therapy Facilty/Provider(s): [facility] Reason for Treatment: Substance abuse  Prior Outpatient Therapy Prior Outpatient Therapy: No Prior Therapy Dates: NA Prior Therapy Facilty/Provider(s): NA Reason for Treatment: NA Does patient have an ACCT team?: No Does patient have Intensive In-House Services?  : No Does patient have [facility] services? : No Does patient have P4CC services?: No  ADL Screening (condition at time of admission) Patient's cognitive ability adequate to safely complete daily activities?: Yes Is the patient deaf or have difficulty hearing?: No Does the patient have difficulty seeing, even when wearing glasses/contacts?: No Does the patient have difficulty concentrating, remembering, or making decisions?: No Patient able to express need for assistance with ADLs?: Yes Does the patient have difficulty dressing or bathing?: No Independently performs ADLs?: Yes (appropriate for developmental age) Does the patient have difficulty walking or climbing stairs?: No Weakness of Legs: None Weakness of Arms/Hands: None       Abuse/Neglect Assessment (Assessment to be complete while patient is alone) Abuse/Neglect Assessment Can Be Completed: Yes Physical Abuse: Denies Verbal Abuse: Denies Sexual Abuse: Denies Exploitation of patient/patient's resources: Denies Self-Neglect: Denies     Merchant navy officerAdvance Directives (For Healthcare) Does Patient Have a Medical Advance Directive?: No Would patient like information on creating a medical advance directive?: No - Patient declined    Additional Information 1:1 In Past 12 Months?: No CIRT Risk:  No Elopement Risk: No Does patient have medical clearance?: Yes     Disposition:  Gave clinical report to Nira ConnJason Berry, NP who recommended Pt be observed for safety and stabilization and re-evaluated later this morning. Notified Dr. Baxter HireKristen Ward and Jory Simsandy Peters, RN of recommendation.  Disposition Initial Assessment Completed for this Encounter: Yes Disposition of Patient: Other dispositions Other disposition(s): Other (Comment)  This service was provided via telemedicine using a 2-way, interactive audio and video technology.  Names of all persons participating in this telemedicine service and their role in this encounter. Name: William Wood Role: Patient  Name: William Wood, [place]PC Role: TTS counselor         William Wood, [facility], [facility], [facility] Triage Specialist [phone]  William Wood, William Wood 09/01/2017 5:25 AM

## 2017-09-01 NOTE — ED Notes (Signed)
Pt eating meal tray at this time

## 2017-09-01 NOTE — ED Provider Notes (Signed)
Psychiatry has seen and cleared patient for discharge.  Patient endorses continued depression.  He does not have an active suicidal plan.  He appears stable for discharge home.  He states he is to be on medicines but does not know what it is.  I do not see any old psychiatric medicines in his prior charts and thus will refer him to psychiatry for outpatient treatment.  Discussed return precautions.   Pricilla LovelessGoldston, Caili Escalera, MD 09/01/17 1319

## 2017-09-01 NOTE — ED Triage Notes (Signed)
Pt states that he smokes crack, last used yesterday. States that he does not want to live anymore, no plan just does not want to wake up. Denies HI, states he hears the devil telling him that he is worthless.

## 2017-09-01 NOTE — ED Notes (Signed)
TTS at bedside. 

## 2017-09-01 NOTE — ED Notes (Signed)
EDP aware pt psychiatrically cleared.

## 2017-09-01 NOTE — ED Notes (Signed)
Pt given all belongings listed on belongings sheet. Pt changing in the restroom at this time. Pt verbalized understanding of all d/c instructions, f/u information, and medications. VSS. All belongings with pt. No valuables in locker or meds in pharmacy

## 2017-09-01 NOTE — ED Notes (Addendum)
Per TTS, pt psychiatrically cleared for d/c. Will notify Dr Criss AlvineGoldston. This RN asked pt if he was able to provide urine specimen at this time. Pt ambulatory with steady gait to provide urine specimen

## 2017-09-01 NOTE — ED Notes (Signed)
TTS being done 

## 2017-09-01 NOTE — ED Notes (Signed)
Called for a sitter. No sitters available at this time. Staffing states that sitters are very short for the am also.

## 2017-09-10 ENCOUNTER — Encounter: Payer: Self-pay | Admitting: Pediatric Intensive Care

## 2017-09-17 ENCOUNTER — Encounter: Payer: Self-pay | Admitting: Internal Medicine

## 2017-09-17 ENCOUNTER — Ambulatory Visit: Payer: Self-pay | Admitting: Internal Medicine

## 2017-09-17 VITALS — BP 112/60 | HR 74 | Resp 12 | Ht 64.0 in | Wt 132.0 lb

## 2017-09-17 DIAGNOSIS — Z720 Tobacco use: Secondary | ICD-10-CM
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
## 2017-09-17 NOTE — Patient Instructions (Signed)
Pick a day this week to quit the death sticks.

## 2017-10-01 NOTE — Congregational Nurse Program (Signed)
Congregational Nurse Program Note  Date of Encounter: 09/10/2017  Past Medical History: Past Medical History:  Diagnosis Date  . Alcohol abuse 11/23/2010  . Allergic rhinitis 11/07/2010  . Anxiety   . Bradycardia   . Cocaine abuse (HCC) 11/23/2010  . Congenital fusion of cervical spine    c5-c6  . DDD (degenerative disc disease), cervical    c6-c7  . Depression   . GERD (gastroesophageal reflux disease)   . Tobacco abuse 11/23/2010    Encounter Details: CNP Questionnaire - 09/10/17 1100      Questionnaire   Patient Status  Not Applicable    Race  Black or African American    Location Patient Served At  [employer]  Not Applicable    Uninsured  Uninsured (NEW 1x/quarter)    Food  Within past 12 months, worried food would run out with no money to buy more    Housing/Utilities  No permanent housing    Transportation  Yes, need transportation assistance;Provided transportation assistance (bus pass, taxi voucher, etc.)    Interpersonal Safety  Yes, feel physically and emotionally safe where you currently live    Medication  Yes, have medication insecurities    Medical Provider  Yes    Referrals  Primary Care Provider/Clinic;Area Agency    ED Visit Averted  Not Applicable    Life-Saving Intervention Made  Not Applicable      Clinical Intake - 09/17/17 1612      Pre-visit preparation   Pre-visit preparation completed  Yes      Pain   Pain   No/denies pain      Nutrition Screen   BMI - recorded  22.66    Nutritional Status  BMI of 19-24  Normal    Nutritional Risks  None    Diabetes  No      Functional Status   Activities of Daily Living  Independent    Ambulation  Independent    Medication Administration  Independent    Home Management  Independent      Risk/Barriers   Barriers to Care Management & Learning  None    Psychosocial Barriers  Uninsured/under-insured      Abuse/Neglect   Do you feel unsafe in your current relationship?  No    Do you feel  physically threatened by others?  No    Anyone hurting you at home, work, or school?  No    Unable to ask?  No    Information provided on Community resources  No      Patient Literacy   How often do you need to have someone help you when you read instructions, pamphlets, or other written materials from your doctor or pharmacy?  1 - Never      Web designer Needed?  No     Client states he has PCP appointment on 3/19. He does not remember the name of his medication but states that he gets [facility] at [facility]. CN offered to assist with [facility]. Client declined.

## 2018-07-16 ENCOUNTER — Encounter: Payer: Self-pay | Admitting: Critical Care Medicine

## 2018-07-16 MED FILL — HYDROCORTISONE 2.5% CREAM: 2.5 | 10 days supply | Qty: 30 | Fill #0

## 2018-07-16 MED FILL — AMOXICILLIN 500 MG CAPSULE: 500 | 7 days supply | Qty: 21 | Fill #0

## 2018-07-17 ENCOUNTER — Encounter (INDEPENDENT_AMBULATORY_CARE_PROVIDER_SITE_OTHER): Payer: Self-pay | Admitting: Critical Care Medicine

## 2018-07-17 NOTE — Progress Notes (Signed)
52 y.o.M seen at [facility] for foot pain, itching and blistering.  Hx of rash on hands and feet.  Shoes fit well. Wears socks. No DM  No medications   NKDA,  Also occ lips swell.  Smoker, hx of PSA  Also c/o tooth pain , poor dentition.  Exam  Afebrile VSS Gen: Pleasant, well-nourished, in no distress,  normal affect  ENT: poor dentition R and L upper molar gum gingivitis, abscess  Neck: No JVD, no TMG, no carotid bruits  Lungs: No use of accessory muscles, no dullness to percussion, clear without rales or rhonchi  Cardiovascular: RRR, heart sounds normal, no murmur or gallops, no peripheral edema  Neuro: alert, non focal  Skin: Warm, dry eczema like lesions on both feet , soles and on ankle areas R>L  Hands are ok    Imp  1. Tooth abscess, has upcoming dental exam  Amoxicillin 500mg  tid x 7 days  2. Dry feet with eczematous rash  Hydrocortisone cream mix with skin moisturizer.  meds called into [facility]

## 2018-09-03 ENCOUNTER — Ambulatory Visit: Payer: Self-pay | Admitting: Internal Medicine

## 2019-04-09 ENCOUNTER — Ambulatory Visit: Payer: Self-pay

## 2019-04-21 ENCOUNTER — Other Ambulatory Visit: Payer: Self-pay

## 2019-04-21 DIAGNOSIS — Z20822 Contact with and (suspected) exposure to covid-19: Secondary | ICD-10-CM

## 2019-04-22 ENCOUNTER — Ambulatory Visit: Payer: Self-pay | Admitting: Gerontology

## 2019-04-22 LAB — NOVEL CORONAVIRUS, NAA: SARS-CoV-2, NAA: NOT DETECTED

## 2019-04-23 ENCOUNTER — Telehealth: Payer: Self-pay | Admitting: Gerontology

## 2019-04-23 NOTE — Telephone Encounter (Signed)
Called to remind of appt next week.

## 2019-04-24 ENCOUNTER — Telehealth: Payer: Self-pay | Admitting: Internal Medicine

## 2019-04-24 NOTE — Telephone Encounter (Signed)
Negative COVID results given. Patient results "NOT Detected." Caller expressed understanding. ° °

## 2019-04-29 ENCOUNTER — Encounter: Payer: Self-pay | Admitting: Gerontology

## 2019-04-29 ENCOUNTER — Ambulatory Visit: Payer: Self-pay | Admitting: Gerontology

## 2019-04-29 ENCOUNTER — Other Ambulatory Visit: Payer: Self-pay

## 2019-04-29 VITALS — BP 102/63 | HR 64 | Ht 65.5 in | Wt 140.4 lb

## 2019-04-29 DIAGNOSIS — F329 Major depressive disorder, single episode, unspecified: Secondary | ICD-10-CM

## 2019-04-29 DIAGNOSIS — Z8719 Personal history of other diseases of the digestive system: Secondary | ICD-10-CM | POA: Insufficient documentation

## 2019-04-29 DIAGNOSIS — F32A Depression, unspecified: Secondary | ICD-10-CM

## 2019-04-29 DIAGNOSIS — Z720 Tobacco use: Secondary | ICD-10-CM

## 2019-04-29 DIAGNOSIS — M25511 Pain in right shoulder: Secondary | ICD-10-CM

## 2019-04-29 DIAGNOSIS — Z7689 Persons encountering health services in other specified circumstances: Secondary | ICD-10-CM

## 2019-04-29 DIAGNOSIS — Z0000 Encounter for general adult medical examination without abnormal findings: Secondary | ICD-10-CM

## 2019-04-29 DIAGNOSIS — M25512 Pain in left shoulder: Secondary | ICD-10-CM | POA: Insufficient documentation

## 2019-04-29 DIAGNOSIS — K029 Dental caries, unspecified: Secondary | ICD-10-CM

## 2019-04-29 MED ORDER — PANTOPRAZOLE SODIUM 40 MG PO TBEC: 40.0000 mg | DELAYED_RELEASE_TABLET | Freq: Every day | ORAL | 3 refills | Status: DC

## 2019-04-29 NOTE — Progress Notes (Signed)
Patient ID: William Wood, male   DOB: February 07, 1966, 53 y.o.   MRN: [id number]  Chief Complaint  Patient presents with  . Establish Care  . Shoulder Pain    ongoing for about 2 months    HPI MICHAELANTHONY Wood is a 53 y.o. male who presents to establish care and evaluation of his chronic conditions. He states that he has a history of depression, his mood is good, denies suicidal or homicidal ideation. He resides at [facility] for substance abuse rehabilitation and follows up at [facility] for mental health counseling. He states that his acid reflux is controlled with taking Protonix 40 mg daily. He smokes 5 cigarettes daily and admits the desire to quit. He also has cavities to his teeth and requests dental referral.  Currently, he states that he is being experiencing bilateral shoulder pain that has being going on for 2 months. He describes pain as a grinding sensation to his shoulders when he's doing push ups. He states that pain intensity increases from 3-6 with activity. He denies any aggravating factor, reports that he has not taking any medication. He denies motor weakness and numbness. He states that he's doing well and offers no further complaint.   Past Medical History:  Diagnosis Date  . Alcohol abuse 11/23/2010  . Allergic rhinitis 11/07/2010  . Anxiety   . Bradycardia   . Cocaine abuse ([place]) 11/23/2010  . Congenital fusion of cervical spine    c5-c6  . DDD (degenerative disc disease), cervical    c6-c7  . Depression   . GERD (gastroesophageal reflux disease)   . Tobacco abuse 11/23/2010    Past Surgical History:  Procedure Laterality Date  . CARDIAC CATHETERIZATION  2014    Family History  Problem Relation Age of Onset  . Stroke Paternal Aunt   . Congestive Heart Failure Paternal Grandmother        '  . Diabetes Other        maternal side of family  . Cancer Other        grandfather  . Alcohol abuse Brother   . Alcohol abuse Brother   . Alcohol abuse Brother     Social  History Social History   Tobacco Use  . Smoking status: Current Every Day Smoker    Packs/day: 0.50    Types: Cigarettes  . Smokeless tobacco: Never Used  Substance Use Topics  . Alcohol use: Not Currently    Comment: in rehab  . Drug use: Not Currently    Types: "Crack" cocaine, Cocaine, Marijuana    Comment: in rehab    No Known Allergies  Current Outpatient Medications  Medication Sig Dispense Refill  . pantoprazole (PROTONIX) 40 MG tablet Take 40 mg by mouth daily.     No current facility-administered medications for this visit.     Review of Systems Review of Systems  Constitutional: Negative.   HENT: Negative.   Eyes: Negative.   Respiratory: Negative.   Cardiovascular: Negative.   Gastrointestinal: Negative.   Endocrine: Negative.   Genitourinary: Negative.   Musculoskeletal: Positive for arthralgias (bilateral shoulder pain).  Skin: Negative.   Neurological: Negative.   Psychiatric/Behavioral: Negative.     Blood pressure 102/63, pulse 64, height 5' 5.5" (1.664 m), weight 140 lb 6.4 oz (63.7 kg), SpO2 99 %.  Physical Exam Physical Exam Constitutional:      Appearance: Normal appearance.  HENT:     Head: Normocephalic and atraumatic.     Nose: Nose normal.  Mouth/Throat:     Mouth: Mucous membranes are moist.  Eyes:     Extraocular Movements: Extraocular movements intact.     Pupils: Pupils are equal, round, and reactive to light.  Neck:     Musculoskeletal: Normal range of motion.  Cardiovascular:     Rate and Rhythm: Normal rate and regular rhythm.     Pulses: Normal pulses.     Heart sounds: Normal heart sounds.  Pulmonary:     Effort: Pulmonary effort is normal.     Breath sounds: Normal breath sounds.  Abdominal:     General: Abdomen is flat. Bowel sounds are normal.     Palpations: Abdomen is soft.  Musculoskeletal:     Right shoulder: He exhibits tenderness (+4 pain on palpation).     Left shoulder: He exhibits tenderness (+4 pain  on palpation).  Skin:    General: Skin is warm and dry.  Neurological:     General: No focal deficit present.     Mental Status: He is alert and oriented to person, place, and time.  Psychiatric:        Mood and Affect: Mood normal.        Behavior: Behavior normal.        Thought Content: Thought content normal.        Judgment: Judgment normal.     Data Reviewed Past medical history was reviewed.  Assessment and Plan  1. Healthcare maintenance Routine lab will be checked. - CBC w/Diff; Future - HgB A1c; Future - Urinalysis, Routine w reflex microscopic; Future - Comp Met (CMET); Future - TSH; Future - Lipid Profile; Future  2. Depression, unspecified depression type - He was advised to continue to follow up at [facility] and call Crisis help line with worsening symptoms.  3. Tobacco abuse - He was provided with [place] Quit line information and encouraged him on smoking cessation.  4. Acute pain of both shoulders - He will follow up with Dr Vickki Hearing  - Ambulatory referral to Orthopedic Surgery  5. H/O gastroesophageal reflux (GERD) - He will continue on current treatment regimen and advised to continue on -Avoid spicy, fatty and fried food -Avoid sodas and sour juices -Avoid heavy meals -Avoid eating 4 hours before bedtime -Elevate head of bed at night - pantoprazole (PROTONIX) 40 MG tablet; Take 1 tablet (40 mg total) by mouth daily.  Dispense: 30 tablet; Refill: 3  6. Encounter to establish care - He was encouraged to complete charity care application for - Ambulatory referral to Gastroenterology for Colonoscopy screening.  7. Dental caries - He was provided with Dental application for referral.     Taelor Moncada E Alida Greiner 04/29/2019, 1:14 PM

## 2019-04-29 NOTE — Patient Instructions (Signed)
Smoking Tobacco Information, Adult Smoking tobacco can be harmful to your health. Tobacco contains a poisonous (toxic), colorless chemical called nicotine. Nicotine is addictive. It changes the brain and can make it hard to stop smoking. Tobacco also has other toxic chemicals that can hurt your body and raise your risk of many cancers. How can smoking tobacco affect me? Smoking tobacco puts you at risk for:  Cancer. Smoking is most commonly associated with lung cancer, but can also lead to cancer in other parts of the body.  Chronic obstructive pulmonary disease (COPD). This is a long-term lung condition that makes it hard to breathe. It also gets worse over time.  High blood pressure (hypertension), heart disease, stroke, or heart attack.  Lung infections, such as pneumonia.  Cataracts. This is when the lenses in the eyes become clouded.  Digestive problems. This may include peptic ulcers, heartburn, and gastroesophageal reflux disease (GERD).  Oral health problems, such as gum disease and tooth loss.  Loss of taste and smell. Smoking can affect your appearance by causing:  Wrinkles.  Yellow or stained teeth, fingers, and fingernails. Smoking tobacco can also affect your social life, because:  It may be challenging to find places to smoke when away from home. Many workplaces, restaurants, hotels, and public places are tobacco-free.  Smoking is expensive. This is due to the cost of tobacco and the long-term costs of treating health problems from smoking.  Secondhand smoke may affect those around you. Secondhand smoke can cause lung cancer, breathing problems, and heart disease. Children of smokers have a higher risk for: ? Sudden infant death syndrome (SIDS). ? Ear infections. ? Lung infections. If you currently smoke tobacco, quitting now can help you:  Lead a longer and healthier life.  Look, smell, breathe, and feel better over time.  Save money.  Protect others from the  harms of secondhand smoke. What actions can I take to prevent health problems? Quit smoking   Do not start smoking. Quit if you already do.  Make a plan to quit smoking and commit to it. Look for programs to help you and ask your health care provider for recommendations and ideas.  Set a date and write down all the reasons you want to quit.  Let your friends and family know you are quitting so they can help and support you. Consider finding friends who also want to quit. It can be easier to quit with someone else, so that you can support each other.  Talk with your health care provider about using nicotine replacement medicines to help you quit, such as gum, lozenges, patches, sprays, or pills.  Do not replace cigarette smoking with electronic cigarettes, which are commonly called e-cigarettes. The safety of e-cigarettes is not known, and some may contain harmful chemicals.  If you try to quit but return to smoking, stay positive. It is common to slip up when you first quit, so take it one day at a time.  Be prepared for cravings. When you feel the urge to smoke, chew gum or suck on hard candy. Lifestyle  Stay busy and take care of your body.  Drink enough fluid to keep your urine pale yellow.  Get plenty of exercise and eat a healthy diet. This can help prevent weight gain after quitting.  Monitor your eating habits. Quitting smoking can cause you to have a larger appetite than when you smoke.  Find ways to relax. Go out with friends or family to a movie or a restaurant   where people do not smoke.  Ask your health care provider about having regular tests (screenings) to check for cancer. This may include blood tests, imaging tests, and other tests.  Find ways to manage your stress, such as meditation, yoga, or exercise. Where to find support To get support to quit smoking, consider:  Asking your health care provider for more information and resources.  Taking classes to learn  more about quitting smoking.  Looking for local organizations that offer resources about quitting smoking.  Joining a support group for people who want to quit smoking in your local community.  Calling the smokefree.gov counselor helpline: 1-800-Quit-Now 985-406-4032) Where to find more information You may find more information about quitting smoking from:  HelpGuide.org: www.helpguide.org  BankRights.uy: smokefree.gov  American Lung Association: www.lung.org Contact a health care provider if you:  Have problems breathing.  Notice that your lips, nose, or fingers turn blue.  Have chest pain.  Are coughing up blood.  Feel faint or you pass out.  Have other health changes that cause you to worry. Summary  Smoking tobacco can negatively affect your health, the health of those around you, your finances, and your social life.  Do not start smoking. Quit if you already do. If you need help quitting, ask your health care provider.  Think about joining a support group for people who want to quit smoking in your local community. There are many effective programs that will help you to quit this behavior. This information is not intended to replace advice given to you by your health care provider. Make sure you discuss any questions you have with your health care provider. Document Released: 07/03/2016 Document Revised: 08/07/2017 Document Reviewed: 07/03/2016 Elsevier Patient Education  2020 Elsevier Inc. Esophagitis  Esophagitis is inflammation of the esophagus. The esophagus is the tube that carries food from your mouth to your stomach. Esophagitis can cause soreness or pain in the esophagus. This condition can make it difficult and painful to swallow. What are the causes? Most causes of esophagitis are not serious. Common causes of this condition include:  Gastroesophageal reflux disease (GERD). This is when stomach contents move back up into the esophagus (reflux).   Repeated vomiting.  An allergic reaction, especially caused by food allergies (eosinophilic esophagitis).  Injury to the esophagus by swallowing large pills with or without water, or swallowing certain types of medicines.  Swallowing (ingesting) harmful chemicals, such as household cleaning products.  Heavy alcohol use.  An infection of the esophagus. This most often occurs in people who have a weakened immune system.  Radiation or chemotherapy treatment for cancer.  Certain diseases such as sarcoidosis, Crohn's disease, and scleroderma. What are the signs or symptoms? Symptoms of this condition include:  Difficult or painful swallowing.  Pain with swallowing acidic liquids, such as citrus juices.  Pain with burping.  Chest pain.  Difficulty breathing.  Nausea.  Vomiting.  Pain in the abdomen.  Weight loss.  Ulcers in the mouth.  Patches of white material in the mouth (candidiasis).  Fever.  Coughing up blood or vomiting blood.  Stool that is black, tarry, or bright red. How is this diagnosed? Your health care provider will take a medical history and perform a physical exam. You may also have other tests, including:  An endoscopy to examine your esophagus and stomach with a small flexible tube with a camera.  A test that measures the acidity level in your esophagus.  A test that measures how much pressure is on  your esophagus.  A barium swallow or modified barium swallow to show the shape, size, and functioning of your esophagus.  Allergy tests. How is this treated? Treatment for this condition depends on the cause of your esophagitis. In some cases, steroids or other medicines may be given to help relieve your symptoms or to treat the underlying cause of your condition. You may have to make some lifestyle changes, such as:  Avoiding alcohol.  Quitting smoking.  Changing your diet.  Exercising.  Changing your sleep habits and your sleep environment.  Follow these instructions at home: Medicines  Take over-the-counter and prescription medicines only as told by your health care provider.  Do not take aspirin, ibuprofen, or other NSAIDs unless your health care provider told you to do so.  If you have trouble taking pills: ? Use a pill splitter to decrease the size of the pill. This will decrease the chance of the pill getting stuck or injuring your esophagus. ? Drink water after you take a pill. Eating and drinking   Avoid foods and drinks that seem to make your symptoms worse.  Follow a diet as recommended by your health care provider. This may involve avoiding foods and drinks such as: ? Coffee and tea (with or without caffeine). ? Drinks that contain alcohol. ? Energy drinks and sports drinks. ? Carbonated drinks or sodas. ? Chocolate and cocoa. ? Peppermint and mint flavorings. ? Garlic and onions. ? Horseradish. ? Spicy and acidic foods, including peppers, chili powder, curry powder, vinegar, hot sauces, and barbecue sauce. ? Citrus fruit juices and citrus fruits, such as oranges, lemons, and limes. ? Tomato-based foods, such as red sauce, chili, salsa, and pizza with red sauce. ? Fried and fatty foods, such as donuts, french fries, potato chips, and high-fat dressings. ? High-fat meats, such as hot dogs and fatty cuts of red and white meats, such as rib eye steak, sausage, ham, and bacon. ? High-fat dairy items, such as whole milk, butter, and cream cheese. Lifestyle  Eat small, frequent meals instead of large meals.  Avoid drinking large amounts of liquid with your meals.  Avoid eating meals during the 2-3 hours before bedtime.  Avoid lying down right after you eat.  Do not exercise right after you eat.  Do not use any products that contain nicotine or tobacco, such as cigarettes and e-cigarettes. If you need help quitting, ask your health care provider. General instructions   Pay attention to any changes in  your symptoms. Let your health care provider know about them.  Wear loose-fitting clothing. Do not wear anything tight around your waist that causes pressure on your abdomen.  Raise (elevate) the head of your bed about 6 inches (15 cm).  Try relaxation strategies such as yoga, deep breathing, or meditation to manage stress. If you need help reducing stress, ask your health care provider.  If you are overweight, reduce your weight to an amount that is healthy for you. Ask your health care provider for guidance about a safe weight loss goal.  Keep all follow-up visits as told by your health care provider. This is important. Contact a health care provider if:  You have new symptoms.  You have unexplained weight loss.  You have difficulty swallowing, or it hurts to swallow.  You have wheezing or a cough that does not go away.  Your symptoms do not improve with treatment.  You have frequent heartburn for more than two weeks. Get help right away if:  You have severe pain in your arms, neck, jaw, teeth, or back.  You feel sweaty, dizzy, or light-headed.  You have chest pain or shortness of breath.  You vomit and your vomit looks like blood or coffee grounds.  Your stool is bloody or black.  You have a fever.  You cannot swallow, drink, or eat. Summary  Esophagitis is inflammation of the esophagus.  Most causes of esophagitis are not serious.  Follow your health care provider's instructions about eating and drinking. Follow instructions on medicines.  Contact a health care provider if you have new symptoms, have weight loss, or coughing that does not stop.  Get help right away if you have severe pain in the arms, neck, jaw, teeth or back, or if you have chest pain, shortness of breath, or fever. This information is not intended to replace advice given to you by your health care provider. Make sure you discuss any questions you have with your health care provider. Document  Released: 07/26/2004 Document Revised: 02/07/2018 Document Reviewed: 02/07/2018 Elsevier Patient Education  2020 [employer]

## 2019-04-30 LAB — CBC WITH DIFFERENTIAL/PLATELET
Basophils Absolute: 0.1 10*3/uL (ref 0.0–0.2)
Basos: 1 %
EOS (ABSOLUTE): 0.4 10*3/uL (ref 0.0–0.4)
Eos: 6 %
Hematocrit: 43.5 % (ref 37.5–51.0)
Hemoglobin: 14.4 g/dL (ref 13.0–17.7)
Immature Grans (Abs): 0 10*3/uL (ref 0.0–0.1)
Immature Granulocytes: 0 %
Lymphocytes Absolute: 2.1 10*3/uL (ref 0.7–3.1)
Lymphs: 29 %
MCH: 29.4 pg (ref 26.6–33.0)
MCHC: 33.1 g/dL (ref 31.5–35.7)
MCV: 89 fL (ref 79–97)
Monocytes Absolute: 0.6 10*3/uL (ref 0.1–0.9)
Monocytes: 8 %
Neutrophils Absolute: 4 10*3/uL (ref 1.4–7.0)
Neutrophils: 56 %
Platelets: 212 10*3/uL (ref 150–450)
RBC: 4.89 x10E6/uL (ref 4.14–5.80)
RDW: 14.4 % (ref 11.6–15.4)
WBC: 7.2 10*3/uL (ref 3.4–10.8)

## 2019-04-30 LAB — URINALYSIS, ROUTINE W REFLEX MICROSCOPIC
Bilirubin, UA: NEGATIVE
Glucose, UA: NEGATIVE
Ketones, UA: NEGATIVE
Leukocytes,UA: NEGATIVE
Nitrite, UA: NEGATIVE
Protein,UA: NEGATIVE
RBC, UA: NEGATIVE
Specific Gravity, UA: 1.021 (ref 1.005–1.030)
Urobilinogen, Ur: 0.2 mg/dL (ref 0.2–1.0)
pH, UA: 5.5 (ref 5.0–7.5)

## 2019-04-30 LAB — LIPID PANEL
Chol/HDL Ratio: 3.1 ratio (ref 0.0–5.0)
Cholesterol, Total: 177 mg/dL (ref 100–199)
HDL: 58 mg/dL (ref >39)
LDL Chol Calc (NIH): 95 mg/dL (ref 0–99)
Triglycerides: 136 mg/dL (ref 0–149)
VLDL Cholesterol Cal: 24 mg/dL (ref 5–40)

## 2019-04-30 LAB — COMPREHENSIVE METABOLIC PANEL
ALT: 14 IU/L (ref 0–44)
AST: 14 IU/L (ref 0–40)
Albumin/Globulin Ratio: 1.8 (ref 1.2–2.2)
Albumin: 4.6 g/dL (ref 3.8–4.9)
Alkaline Phosphatase: 67 IU/L (ref 39–117)
BUN/Creatinine Ratio: 12 (ref 9–20)
BUN: 12 mg/dL (ref 6–24)
Bilirubin Total: 0.3 mg/dL (ref 0.0–1.2)
CO2: 27 mmol/L (ref 20–29)
Calcium: 9.8 mg/dL (ref 8.7–10.2)
Chloride: 101 mmol/L (ref 96–106)
Creatinine, Ser: 1.02 mg/dL (ref 0.76–1.27)
GFR calc Af Amer: 97 mL/min/{1.73_m2} (ref >59)
GFR calc non Af Amer: 84 mL/min/{1.73_m2} (ref >59)
Globulin, Total: 2.5 g/dL (ref 1.5–4.5)
Glucose: 84 mg/dL (ref 65–99)
Potassium: 4.4 mmol/L (ref 3.5–5.2)
Sodium: 142 mmol/L (ref 134–144)
Total Protein: 7.1 g/dL (ref 6.0–8.5)

## 2019-04-30 LAB — HEMOGLOBIN A1C
Est. average glucose Bld gHb Est-mCnc: 123 mg/dL
Hgb A1c MFr Bld: 5.9 % — ABNORMAL HIGH (ref 4.8–5.6)

## 2019-04-30 LAB — TSH: TSH: 1.74 u[IU]/mL (ref 0.450–4.500)

## 2019-05-08 ENCOUNTER — Ambulatory Visit: Payer: Self-pay | Admitting: Pharmacy Technician

## 2019-05-08 ENCOUNTER — Other Ambulatory Visit: Payer: Self-pay

## 2019-05-08 DIAGNOSIS — Z79899 Other long term (current) drug therapy: Secondary | ICD-10-CM

## 2019-05-08 NOTE — Progress Notes (Signed)
Completed [facility] application and contract.  Patient agreed to all terms of the [facility] contract.    Patient approved to receive medication assistance at [facility] as long as eligibility criteria continues to be met.    Provided patient with Civil engineer, contracting based on his particular needs.    Referring to [facility].  [facility]

## 2019-05-26 ENCOUNTER — Other Ambulatory Visit: Payer: Self-pay

## 2019-05-26 ENCOUNTER — Ambulatory Visit: Payer: Self-pay | Admitting: Pharmacist

## 2019-05-26 DIAGNOSIS — Z79899 Other long term (current) drug therapy: Secondary | ICD-10-CM

## 2019-05-26 NOTE — Progress Notes (Addendum)
[facility] Visit Note  Patient: William Wood MRN: [id number] Date of Birth: 1965/11/22 PCP: Julieanne Manson, MD   William Wood 53 y.o. male presents for a initial MTM visit today.  There were no vitals taken for this visit.  Patient Information   Past Medical History:  Diagnosis Date  . Alcohol abuse 11/23/2010  . Allergic rhinitis 11/07/2010  . Anxiety   . Bradycardia   . Cocaine abuse ([facility]) 11/23/2010  . Congenital fusion of cervical spine    c5-c6  . DDD (degenerative disc disease), cervical    c6-c7  . Depression   . GERD (gastroesophageal reflux disease)   . Tobacco abuse 11/23/2010      Past Surgical History:  Procedure Laterality Date  . CARDIAC CATHETERIZATION  2014     Family History  Problem Relation Age of Onset  . Stroke Paternal Aunt   . Congestive Heart Failure Paternal Grandmother        '  . Diabetes Other        maternal side of family  . Cancer Other        grandfather  . Alcohol abuse Brother   . Alcohol abuse Brother   . Alcohol abuse Brother     Lifestyle:  Patient performs regular exercise.  Diet: Breakfast:Cereal, raisin bran, oatmeal Lunch/dinner:  Cheesy/buttery potatoes, baked pork chops            Social History   Substance and Sexual Activity  Alcohol Use Not Currently   Comment: in rehab      Social History   Tobacco Use  Smoking Status Current Every Day Smoker  . Packs/day: 0.50  . Types: Cigarettes  Smokeless Tobacco Never Used      Health Maintenance  Topic Date Due  . COLONOSCOPY  08/04/2015  . TETANUS/TDAP  01/12/2026  . INFLUENZA VACCINE  Completed  . HIV Screening  Completed     Assessment and Plan:  Health Maintenance/Date Completed  Last ED visit: 2 years ago Last Visit to PCP: October Next Visit to PCP: > Specialist Visit: Next month, Dec Dental Exam: Dec or Jan Eye Exam: Been a while Prostate Exam: No Pelvic/PAP Exam: NA Mammogram: NA DEXA: NA Colonoscopy:  Unsure Flu Vaccine: Yes Pneumonia Vaccine: NA   Alcohol:  Yes  Smoking:  10 cigarettes   Adherence:  No, currently a resident in local rehabilitation facility.  Medicines are provided to him daily.  Patient takes medicine daily.   Renal function:  Baseline renal function appears to be ~1.0.     GERD:  Pantoprazole 40 mg daily, but still not controlled.  Patient reports frequent belching and nausea.  He feels pain inside his neck/esophagus, and feels like he has damage to his esophagus because of his reflux.  To his knowledge, he has never had endoscopy.  We discussed returning to his PCP to discuss these symptoms, and possibly inquire about referral to gastroenterology.  We discussed lifestyle modifications in the meantime.  Avoiding high fat, greasy, sugary, and spicy foods, especially 3-4 hours prior to bedtime.  As he goes to bed at 9:30 or 10 PM, we discussed eating dinner before 6 PM.  Advised to be aware of healthy foods like vegetables that might be soaked in butter or cheese.  We also discussed trying to sleep in a more inclined position as he is able.  Smoking cessation:  He reports using <5 cigarettes/day (~1-2 cigarettes total).  Patient was taking bupropion in the past, but  lost insurance and had to stop treatment too early.  He has tried Nicoderm patches, but feels the nicotine products are making his GERD worse.  We discussed other non-nicotine treatment options such as Chantix and bupropion.  Informed him to ask about these when visiting PCP, as they are Rx only.    BL shoulder pain:  Currently no medications.  Referred to Dr. Vickki Hearing.  Patient reports pain when doing push ups radiating from his shoulder to his arm.  He has cut back on his physical activity until he can be seen by Dr. Vickki Hearing.  He feels relief during hot showers, and I encouraged him to request heating pads.  Follow-up:  1 year  Gerald Dexter, PharmD Pharmacy Resident  05/27/2019 9:02 AM

## 2019-06-18 ENCOUNTER — Other Ambulatory Visit: Payer: Self-pay

## 2019-06-18 ENCOUNTER — Encounter: Payer: Self-pay | Admitting: Gerontology

## 2019-06-18 ENCOUNTER — Ambulatory Visit: Payer: Self-pay | Admitting: Gerontology

## 2019-06-18 VITALS — BP 121/77 | HR 93 | Ht 65.5 in | Wt 152.0 lb

## 2019-06-18 DIAGNOSIS — R7303 Prediabetes: Secondary | ICD-10-CM

## 2019-06-18 DIAGNOSIS — Z8719 Personal history of other diseases of the digestive system: Secondary | ICD-10-CM

## 2019-06-18 DIAGNOSIS — G8929 Other chronic pain: Secondary | ICD-10-CM

## 2019-06-18 DIAGNOSIS — M25512 Pain in left shoulder: Secondary | ICD-10-CM

## 2019-06-18 MED ORDER — PANTOPRAZOLE SODIUM 40 MG PO TBEC: 40.0000 mg | DELAYED_RELEASE_TABLET | Freq: Every day | ORAL | 2 refills | Status: DC

## 2019-06-18 NOTE — Progress Notes (Signed)
Established Patient Office Visit  Subjective:  Patient ID: William Wood, male    DOB: 11/23/1965  Age: 53 y.o. MRN: [id number]  CC:  Chief Complaint  Patient presents with  . Gastroesophageal Reflux    HPI William Wood presents for follow-up of acid reflux, bilateral shoulder pain and lab review.  He continues to reside at [facility] for substance abuse rehabilitation.  He states that his acid reflux is controlled with taking 40 mg Protonix daily, and he has gastroenterology appointment with Dr Laurena SlimmerA. Kiran on 07/07/2019.  He states that his bilateral shoulder pain has not improved, and he will follow-up with orthopedic surgeon Dr. Justice RocherFossier on 06/30/2019.  His hemoglobin A1c done on 04/29/2019 was 5.9%.  He denies chest pain, palpitation, dizziness, shortness of breath, fever and chills.  He states that he is doing well and offers no further complaint.  Past Medical History:  Diagnosis Date  . Alcohol abuse 11/23/2010  . Allergic rhinitis 11/07/2010  . Anxiety   . Bradycardia   . Cocaine abuse (HCC) 11/23/2010  . Congenital fusion of cervical spine    c5-c6  . DDD (degenerative disc disease), cervical    c6-c7  . Depression   . GERD (gastroesophageal reflux disease)   . Tobacco abuse 11/23/2010    Past Surgical History:  Procedure Laterality Date  . CARDIAC CATHETERIZATION  2014    Family History  Problem Relation Age of Onset  . Stroke Paternal Aunt   . Congestive Heart Failure Paternal Grandmother        '  . Diabetes Other        maternal side of family  . Cancer Other        grandfather  . Alcohol abuse Brother   . Alcohol abuse Brother   . Alcohol abuse Brother     Social History   Socioeconomic History  . Marital status: Married    Spouse name: Not on file  . Number of children: Not on file  . Years of education: Not on file  . Highest education level: Not on file  Occupational History  . Not on file  Tobacco Use  . Smoking status: Former Smoker    Packs/day:  0.50    Types: Cigarettes    Quit date: 06/02/2019    Years since quitting: 0.0  . Smokeless tobacco: Never Used  Substance and Sexual Activity  . Alcohol use: Not Currently    Comment: in rehab  . Drug use: Not Currently    Types: "Crack" cocaine, Cocaine, Marijuana    Comment: in rehab  . Sexual activity: Yes    Birth control/protection: None  Other Topics Concern  . 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  Was imprisoned in 1995 for 8.5 years for robbery with a deadly weapon.  Mother shot and killed his father while pregnant with him.  Mother was shot and killed when he was 4.  Some trade school while imprisoned.  Live in [place] with his uncle.  He has 2 sisters and 6 brothers.  Was brought up by his grandparents.  Two sons and a daughter that do not live with him.    Social Determinants of Health   Financial Resource Strain:   . Difficulty of Paying Living Expenses: Not on file  Food Insecurity:   . Worried About Programme researcher, broadcasting/film/videounning Out of Food in the Last Year: Not on file  . Ran  Out of Food in the Last Year: Not on file  Transportation Needs:   . Lack of Transportation (Medical): Not on file  . Lack of Transportation (Non-Medical): Not on file  Physical Activity:   . Days of Exercise per Week: Not on file  . Minutes of Exercise per Session: Not on file  Stress:   . Feeling of Stress : Not on file  Social Connections:   . Frequency of Communication with Friends and Family: Not on file  . Frequency of Social Gatherings with Friends and Family: Not on file  . Attends Religious Services: Not on file  . Active Member of Clubs or Organizations: Not on file  . Attends Archivist Meetings: Not on file  . Marital Status: Not on file  Intimate Partner Violence:   . Fear of Current or Ex-Partner: Not on file  . Emotionally Abused: Not on file  . Physically Abused: Not on file  . Sexually Abused: Not on file     Outpatient Medications Prior to Visit  Medication Sig Dispense Refill  . pantoprazole (PROTONIX) 40 MG tablet Take 1 tablet (40 mg total) by mouth daily. 30 tablet 3   No facility-administered medications prior to visit.    No Known Allergies  ROS Review of Systems  Constitutional: Negative.   Respiratory: Negative.   Cardiovascular: Negative.   Gastrointestinal: Negative.   Musculoskeletal: Positive for arthralgias (chronic bilateral shoulder pain.).  Neurological: Negative.   Psychiatric/Behavioral: Negative.       Objective:    Physical Exam  Constitutional: He is oriented to person, place, and time. He appears well-developed.  HENT:  Head: Normocephalic and atraumatic.  Cardiovascular: Normal rate and regular rhythm.  Pulmonary/Chest: Effort normal and breath sounds normal.  Abdominal: Soft. Bowel sounds are normal.  Musculoskeletal:        General: Tenderness (Mild tenderness with palpitation) present.  Neurological: He is alert and oriented to person, place, and time.  Skin: Skin is warm and dry.  Psychiatric: He has a normal mood and affect. His behavior is normal. Judgment and thought content normal.    BP 121/77 (BP Location: Left Arm, Patient Position: Sitting)   Pulse 93   Ht 5' 5.5" (1.664 m)   Wt 152 lb (68.9 kg)   SpO2 97%   BMI 24.91 kg/m  Wt Readings from Last 3 Encounters:  06/18/19 152 lb (68.9 kg)  04/29/19 140 lb 6.4 oz (63.7 kg)  09/17/17 132 lb (59.9 kg)     Health Maintenance Due  Topic Date Due  . COLONOSCOPY  08/04/2015    There are no preventive care reminders to display for this patient.  Lab Results  Component Value Date   TSH 1.740 04/29/2019   Lab Results  Component Value Date   WBC 7.2 04/29/2019   HGB 14.4 04/29/2019   HCT 43.5 04/29/2019   MCV 89 04/29/2019   PLT 212 04/29/2019   Lab Results  Component Value Date   NA 142 04/29/2019   K 4.4 04/29/2019   CO2 27 04/29/2019   GLUCOSE 84 04/29/2019   BUN 12  04/29/2019   CREATININE 1.02 04/29/2019   BILITOT 0.3 04/29/2019   ALKPHOS 67 04/29/2019   AST 14 04/29/2019   ALT 14 04/29/2019   PROT 7.1 04/29/2019   ALBUMIN 4.6 04/29/2019   CALCIUM 9.8 04/29/2019   ANIONGAP 15 09/01/2017   Lab Results  Component Value Date   CHOL 177 04/29/2019   Lab Results  Component Value  Date   HDL 58 04/29/2019   Lab Results  Component Value Date   LDLCALC 95 04/29/2019   Lab Results  Component Value Date   TRIG 136 04/29/2019   Lab Results  Component Value Date   CHOLHDL 3.1 04/29/2019   Lab Results  Component Value Date   HGBA1C 5.9 (H) 04/29/2019      Assessment & Plan:   1. H/O gastroesophageal reflux (GERD) -His acid reflux is under control, and he will continue on current treatment regimen. -Avoid spicy, fatty and fried food -Avoid sodas and sour juices -Avoid heavy meals -Avoid eating 4 hours before bedtime -Elevate head of bed at night - pantoprazole (PROTONIX) 40 MG tablet; Take 1 tablet (40 mg total) by mouth daily.  Dispense: 30 tablet; Refill: 2  2. Chronic pain of both shoulders -He was encouraged to keep his appointment with orthopedic surgeon Dr. Justice Rocher.  3. Prediabetes -His hemoglobin A1c was 5.9%, and he defers Metformin therapy, stating that he will make lifestyle modifications.  He was advised to continue on low carbohydrate/no concentrated sweet diet. - HgB A1c; Future     Follow-up: Return in about 8 weeks (around 08/12/2019), or if symptoms worsen or fail to improve.    Dela Sweeny Trellis Paganini, NP

## 2019-06-18 NOTE — Patient Instructions (Addendum)
Food Choices for Gastroesophageal Reflux Disease, Adult When you have gastroesophageal reflux disease (GERD), the foods you eat and your eating habits are very important. Choosing the right foods can help ease your discomfort. Think about working with a nutrition specialist (dietitian) to help you make good choices. What are tips for following this plan?  Meals  Choose healthy foods that are low in fat, such as fruits, vegetables, whole grains, low-fat dairy products, and lean meat, fish, and poultry.  Eat small meals often instead of 3 large meals a day. Eat your meals slowly, and in a place where you are relaxed. Avoid bending over or lying down until 2-3 hours after eating.  Avoid eating meals 2-3 hours before bed.  Avoid drinking a lot of liquid with meals.  Cook foods using methods other than frying. Bake, grill, or broil food instead.  Avoid or limit: ? Chocolate. ? Peppermint or spearmint. ? Alcohol. ? Pepper. ? Black and decaffeinated coffee. ? Black and decaffeinated tea. ? Bubbly (carbonated) soft drinks. ? Caffeinated energy drinks and soft drinks.  Limit high-fat foods such as: ? Fatty meat or fried foods. ? Whole milk, cream, butter, or ice cream. ? Nuts and nut butters. ? Pastries, donuts, and sweets made with butter or shortening.  Avoid foods that cause symptoms. These foods may be different for everyone. Common foods that cause symptoms include: ? Tomatoes. ? Oranges, lemons, and limes. ? Peppers. ? Spicy food. ? Onions and garlic. ? Vinegar. Lifestyle  Maintain a healthy weight. Ask your doctor what weight is healthy for you. If you need to lose weight, work with your doctor to do so safely.  Exercise for at least 30 minutes for 5 or more days each week, or as told by your doctor.  Wear loose-fitting clothes.  Do not smoke. If you need help quitting, ask your doctor.  Sleep with the head of your bed higher than your feet. Use a wedge under the  mattress or blocks under the bed frame to raise the head of the bed. Summary  When you have gastroesophageal reflux disease (GERD), food and lifestyle choices are very important in easing your symptoms.  Eat small meals often instead of 3 large meals a day. Eat your meals slowly, and in a place where you are relaxed.  Limit high-fat foods such as fatty meat or fried foods.  Avoid bending over or lying down until 2-3 hours after eating.  Avoid peppermint and spearmint, caffeine, alcohol, and chocolate. This information is not intended to replace advice given to you by your health care provider. Make sure you discuss any questions you have with your health care provider. Document Released: 12/18/2011 Document Revised: 10/09/2018 Document Reviewed: 07/24/2016 Elsevier Patient Education  2020 [place] for Diabetes Mellitus, Adult  Carbohydrate counting is a method of keeping track of how many carbohydrates you eat. Eating carbohydrates naturally increases the amount of sugar (glucose) in the blood. Counting how many carbohydrates you eat helps keep your blood glucose within normal limits, which helps you manage your diabetes (diabetes mellitus). It is important to know how many carbohydrates you can safely have in each meal. This is different for every person. A diet and nutrition specialist (registered dietitian) can help you make a meal plan and calculate how many carbohydrates you should have at each meal and snack. Carbohydrates are found in the following foods:  Grains, such as breads and cereals.  Dried beans and soy products.  Starchy vegetables,  such as potatoes, peas, and corn.  Fruit and fruit juices.  Milk and yogurt.  Sweets and snack foods, such as cake, cookies, candy, chips, and soft drinks. How do I count carbohydrates? There are two ways to count carbohydrates in food. You can use either of the methods or a combination of both. Reading  "Nutrition Facts" on packaged food The "Nutrition Facts" list is included on the labels of almost all packaged foods and beverages in the [place] It includes:  The serving size.  Information about nutrients in each serving, including the grams (g) of carbohydrate per serving. To use the "Nutrition Facts":  Decide how many servings you will have.  Multiply the number of servings by the number of carbohydrates per serving.  The resulting number is the total amount of carbohydrates that you will be having. Learning standard serving sizes of other foods When you eat carbohydrate foods that are not packaged or do not include "Nutrition Facts" on the label, you need to measure the servings in order to count the amount of carbohydrates:  Measure the foods that you will eat with a food scale or measuring cup, if needed.  Decide how many standard-size servings you will eat.  Multiply the number of servings by 15. Most carbohydrate-rich foods have about 15 g of carbohydrates per serving. ? For example, if you eat 8 oz (170 g) of strawberries, you will have eaten 2 servings and 30 g of carbohydrates (2 servings x 15 g = 30 g).  For foods that have more than one food mixed, such as soups and casseroles, you must count the carbohydrates in each food that is included. The following list contains standard serving sizes of common carbohydrate-rich foods. Each of these servings has about 15 g of carbohydrates:   hamburger bun or  English muffin.   oz (15 mL) syrup.   oz (14 g) jelly.  1 slice of bread.  1 six-inch tortilla.  3 oz (85 g) cooked rice or pasta.  4 oz (113 g) cooked dried beans.  4 oz (113 g) starchy vegetable, such as peas, corn, or potatoes.  4 oz (113 g) hot cereal.  4 oz (113 g) mashed potatoes or  of a large baked potato.  4 oz (113 g) canned or frozen fruit.  4 oz (120 mL) fruit juice.  4-6 crackers.  6 chicken nuggets.  6 oz (170 g) unsweetened dry  cereal.  6 oz (170 g) plain fat-free yogurt or yogurt sweetened with artificial sweeteners.  8 oz (240 mL) milk.  8 oz (170 g) fresh fruit or one small piece of fruit.  24 oz (680 g) popped popcorn. Example of carbohydrate counting Sample meal  3 oz (85 g) chicken breast.  6 oz (170 g) brown rice.  4 oz (113 g) corn.  8 oz (240 mL) milk.  8 oz (170 g) strawberries with sugar-free whipped topping. Carbohydrate calculation 1. Identify the foods that contain carbohydrates: ? Rice. ? Corn. ? Milk. ? Strawberries. 2. Calculate how many servings you have of each food: ? 2 servings rice. ? 1 serving corn. ? 1 serving milk. ? 1 serving strawberries. 3. Multiply each number of servings by 15 g: ? 2 servings rice x 15 g = 30 g. ? 1 serving corn x 15 g = 15 g. ? 1 serving milk x 15 g = 15 g. ? 1 serving strawberries x 15 g = 15 g. 4. Add together all of the amounts to find  the total grams of carbohydrates eaten: ? 30 g + 15 g + 15 g + 15 g = 75 g of carbohydrates total. Summary  Carbohydrate counting is a method of keeping track of how many carbohydrates you eat.  Eating carbohydrates naturally increases the amount of sugar (glucose) in the blood.  Counting how many carbohydrates you eat helps keep your blood glucose within normal limits, which helps you manage your diabetes.  A diet and nutrition specialist (registered dietitian) can help you make a meal plan and calculate how many carbohydrates you should have at each meal and snack. This information is not intended to replace advice given to you by your health care provider. Make sure you discuss any questions you have with your health care provider. Document Released: 06/18/2005 Document Revised: 01/10/2017 Document Reviewed: 11/30/2015 Elsevier Patient Education  2020 [employer].

## 2019-06-30 ENCOUNTER — Ambulatory Visit: Payer: Self-pay | Admitting: Specialist

## 2019-06-30 ENCOUNTER — Other Ambulatory Visit: Payer: Self-pay

## 2019-06-30 DIAGNOSIS — Q7649 Other congenital malformations of spine, not associated with scoliosis: Secondary | ICD-10-CM

## 2019-06-30 NOTE — Progress Notes (Signed)
  Subjective:     Patient ID: William Wood, male   DOB: 15-Feb-1966, 53 y.o.   MRN: [id number]  HPI 53 year old male with bilateral shoulder complaints. There was no Hx of trauma. Is able to sleep on either side and can put belt through loops with difficulty. Personal hygiene is not a problem. He has not been evaluated for these issues before. He is unable to tolerate NSAIDs due to acid reflux.  Review of Systems     Objective:   Physical Exam L shoulder; FF=120 degrees, EXT= 60 degrees, ADD=30 degrees, ABD=90 degrees, IR/ER=90/30  R shoulder; FF=60 degrees, EXT=50 degrees, ADD=30 degrees, ABD=60 degrees, IR/ER=90/90  PROM is only slightly reduced BIL. He is TTP over the cuff but not over the bicipital groove.    MMT 5/5 with good effort. Assessment:     Tendonitis in shouders    Plan:      Going to refer to PT, return to clinic after completion of PT.  He came back wishing to be x-rayed before starting therapy. This is reasonable. X-ray's have been ordered, will return to clinic at next available opening.

## 2019-07-07 ENCOUNTER — Encounter: Payer: Self-pay | Admitting: *Deleted

## 2019-07-07 ENCOUNTER — Ambulatory Visit: Payer: Self-pay | Admitting: Gastroenterology

## 2019-07-09 ENCOUNTER — Ambulatory Visit
Admission: RE | Admit: 2019-07-09 | Discharge: 2019-07-09 | Disposition: A | Discharge status: Home / Self Care | Payer: Self-pay | Source: Ambulatory Visit | Attending: Gerontology | Admitting: Gerontology

## 2019-07-09 ENCOUNTER — Other Ambulatory Visit: Payer: Self-pay

## 2019-07-09 ENCOUNTER — Ambulatory Visit
Admission: RE | Admit: 2019-07-09 | Discharge: 2019-07-09 | Disposition: A | Discharge status: Home / Self Care | Payer: Self-pay | Attending: Gerontology | Admitting: Gerontology

## 2019-07-09 DIAGNOSIS — G8929 Other chronic pain: Secondary | ICD-10-CM

## 2019-07-09 DIAGNOSIS — M25512 Pain in left shoulder: Secondary | ICD-10-CM

## 2019-07-09 DIAGNOSIS — M25511 Pain in right shoulder: Secondary | ICD-10-CM | POA: Insufficient documentation

## 2019-07-17 NOTE — Congregational Nurse Program (Signed)
William Wood advised on his diet choices as it relates to meals served at [employer]

## 2019-08-04 ENCOUNTER — Ambulatory Visit: Payer: Self-pay | Admitting: Gastroenterology

## 2019-08-04 ENCOUNTER — Encounter: Payer: Self-pay | Admitting: Gastroenterology

## 2019-08-05 ENCOUNTER — Other Ambulatory Visit: Payer: Self-pay

## 2019-08-05 DIAGNOSIS — R7303 Prediabetes: Secondary | ICD-10-CM

## 2019-08-06 LAB — HEMOGLOBIN A1C
Est. average glucose Bld gHb Est-mCnc: 134 mg/dL
Hgb A1c MFr Bld: 6.3 % — ABNORMAL HIGH (ref 4.8–5.6)

## 2019-08-12 ENCOUNTER — Other Ambulatory Visit: Payer: Self-pay

## 2019-08-12 ENCOUNTER — Encounter: Payer: Self-pay | Admitting: Gerontology

## 2019-08-12 ENCOUNTER — Ambulatory Visit: Payer: Self-pay | Admitting: Gerontology

## 2019-08-12 VITALS — BP 122/76 | HR 56 | Temp 97.1°F | Ht 65.0 in | Wt 155.3 lb

## 2019-08-12 DIAGNOSIS — R7303 Prediabetes: Secondary | ICD-10-CM

## 2019-08-12 DIAGNOSIS — Z8719 Personal history of other diseases of the digestive system: Secondary | ICD-10-CM

## 2019-08-12 DIAGNOSIS — R001 Bradycardia, unspecified: Secondary | ICD-10-CM

## 2019-08-12 MED ORDER — PANTOPRAZOLE SODIUM 40 MG PO TBEC: 40.0000 mg | DELAYED_RELEASE_TABLET | Freq: Every day | ORAL | 3 refills | Status: DC

## 2019-08-12 MED ORDER — METFORMIN HCL 1000 MG PO TABS: 500.0000 mg | ORAL_TABLET | Freq: Every day | ORAL | 0 refills | Status: DC

## 2019-08-12 NOTE — Progress Notes (Deleted)
Established Patient Office Visit  Subjective:  Patient ID: William Wood, male    DOB: 1966/05/23  Age: 54 y.o. MRN: [id number]  CC: No chief complaint on file.   HPI William Wood presents for ***  Past Medical History:  Diagnosis Date  . Alcohol abuse 11/23/2010  . Allergic rhinitis 11/07/2010  . Anxiety   . Bradycardia   . Cocaine abuse (HCC) 11/23/2010  . Congenital fusion of cervical spine    c5-c6  . DDD (degenerative disc disease), cervical    c6-c7  . Depression   . GERD (gastroesophageal reflux disease)   . Tobacco abuse 11/23/2010    Past Surgical History:  Procedure Laterality Date  . CARDIAC CATHETERIZATION  2014    Family History  Problem Relation Age of Onset  . Stroke Paternal Aunt   . Congestive Heart Failure Paternal Grandmother        '  . Diabetes Other        maternal side of family  . Cancer Other        grandfather  . Alcohol abuse Brother   . Alcohol abuse Brother   . Alcohol abuse Brother     Social History   Socioeconomic History  . Marital status: Married    Spouse name: Not on file  . Number of children: Not on file  . Years of education: Not on file  . Highest education level: Not on file  Occupational History  . Not on file  Tobacco Use  . Smoking status: Former Smoker    Packs/day: 0.50    Types: Cigarettes    Quit date: 06/02/2019    Years since quitting: 0.1  . Smokeless tobacco: Never Used  Substance and Sexual Activity  . Alcohol use: Not Currently    Comment: in rehab  . Drug use: Not Currently    Types: "Crack" cocaine, Cocaine, Marijuana    Comment: in rehab  . Sexual activity: Yes    Birth control/protection: None  Other Topics Concern  . 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  Was imprisoned in 1995 for 8.5 years for robbery with a deadly weapon.  Mother shot and killed his father while pregnant with him.  Mother was shot and killed when he  was 4.  Some trade school while imprisoned.  Live in [place] with his uncle.  He has 2 sisters and 6 brothers.  Was brought up by his grandparents.  Two sons and a daughter that do not live with him.    Social Determinants of Health   Financial Resource Strain:   . Difficulty of Paying Living Expenses: Not on file  Food Insecurity:   . Worried About Programme researcher, broadcasting/film/video in the Last Year: Not on file  . Ran Out of Food in the Last Year: Not on file  Transportation Needs:   . Lack of Transportation (Medical): Not on file  . Lack of Transportation (Non-Medical): Not on file  Physical Activity:   . Days of Exercise per Week: Not on file  . Minutes of Exercise per Session: Not on file  Stress:   . Feeling of Stress : Not on file  Social Connections:   . Frequency of Communication with Friends and Family: Not on file  . Frequency of Social Gatherings with Friends and Family: Not on file  . Attends Religious Services: Not on file  . Active Member of  Clubs or Organizations: Not on file  . Attends Archivist Meetings: Not on file  . Marital Status: Not on file  Intimate Partner Violence:   . Fear of Current or Ex-Partner: Not on file  . Emotionally Abused: Not on file  . Physically Abused: Not on file  . Sexually Abused: Not on file    Outpatient Medications Prior to Visit  Medication Sig Dispense Refill  . pantoprazole (PROTONIX) 40 MG tablet Take 1 tablet (40 mg total) by mouth daily. 30 tablet 2   No facility-administered medications prior to visit.    No Known Allergies  ROS Review of Systems    Objective:    Physical Exam  There were no vitals taken for this visit. Wt Readings from Last 3 Encounters:  06/18/19 152 lb (68.9 kg)  04/29/19 140 lb 6.4 oz (63.7 kg)  09/17/17 132 lb (59.9 kg)     Health Maintenance Due  Topic Date Due  . COLONOSCOPY  08/04/2015    There are no preventive care reminders to display for this patient.  Lab Results   Component Value Date   TSH 1.740 04/29/2019   Lab Results  Component Value Date   WBC 7.2 04/29/2019   HGB 14.4 04/29/2019   HCT 43.5 04/29/2019   MCV 89 04/29/2019   PLT 212 04/29/2019   Lab Results  Component Value Date   NA 142 04/29/2019   K 4.4 04/29/2019   CO2 27 04/29/2019   GLUCOSE 84 04/29/2019   BUN 12 04/29/2019   CREATININE 1.02 04/29/2019   BILITOT 0.3 04/29/2019   ALKPHOS 67 04/29/2019   AST 14 04/29/2019   ALT 14 04/29/2019   PROT 7.1 04/29/2019   ALBUMIN 4.6 04/29/2019   CALCIUM 9.8 04/29/2019   ANIONGAP 15 09/01/2017   Lab Results  Component Value Date   CHOL 177 04/29/2019   Lab Results  Component Value Date   HDL 58 04/29/2019   Lab Results  Component Value Date   LDLCALC 95 04/29/2019   Lab Results  Component Value Date   TRIG 136 04/29/2019   Lab Results  Component Value Date   CHOLHDL 3.1 04/29/2019   Lab Results  Component Value Date   HGBA1C 6.3 (H) 08/05/2019      Assessment & Plan:   Problem List Items Addressed This Visit    None      No orders of the defined types were placed in this encounter.   Follow-up: No follow-ups on file.    William Wood

## 2019-08-12 NOTE — Progress Notes (Signed)
Established Patient Office Visit  Subjective:  Patient ID: William Wood, male    DOB: 15-Jan-1966  Age: 54 y.o. MRN: [id number]  CC:  Chief Complaint  Patient presents with  . Follow-up    acid reflux improving    HPI William Wood presents for follow up of GERD and Lab review. His HgbA1c done on 08/05/2019 was 6.3%. He states that he's compliant with his medications and his acid reflux is better controlled with taking 40 mg Protonix. He will follow up with Gastroenterologist with Dr Sharlet Salina for Colonoscopy screening. His heart rate was 56 beats per minute, he states that it's normal for him. He denies shortness of breath, chest pain and light headedness. Overall, he states that he's doing well and offers no further complaint.  Past Medical History:  Diagnosis Date  . Alcohol abuse 11/23/2010  . Allergic rhinitis 11/07/2010  . Anxiety   . Bradycardia   . Cocaine abuse (HCC) 11/23/2010  . Congenital fusion of cervical spine    c5-c6  . DDD (degenerative disc disease), cervical    c6-c7  . Depression   . GERD (gastroesophageal reflux disease)   . Tobacco abuse 11/23/2010    Past Surgical History:  Procedure Laterality Date  . CARDIAC CATHETERIZATION  2014    Family History  Problem Relation Age of Onset  . Stroke Paternal Aunt   . Congestive Heart Failure Paternal Grandmother        '  . Diabetes Other        maternal side of family  . Cancer Other        grandfather  . Alcohol abuse Brother   . Alcohol abuse Brother   . Alcohol abuse Brother     Social History   Socioeconomic History  . Marital status: Married    Spouse name: Not on file  . Number of children: Not on file  . Years of education: Not on file  . Highest education level: Not on file  Occupational History  . Not on file  Tobacco Use  . Smoking status: Former Smoker    Packs/day: 0.50    Types: Cigarettes    Quit date: 06/02/2019    Years since quitting: 0.1  . Smokeless tobacco: Never Used   Substance and Sexual Activity  . Alcohol use: Not Currently    Comment: in rehab  . Drug use: Not Currently    Types: "Crack" cocaine, Cocaine, Marijuana    Comment: in rehab  . Sexual activity: Yes    Birth control/protection: None  Other Topics Concern  . 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  Was imprisoned in 1995 for 8.5 years for robbery with a deadly weapon.  Mother shot and killed his father while pregnant with him.  Mother was shot and killed when he was 4.  Some trade school while imprisoned.  Live in [place] with his uncle.  He has 2 sisters and 6 brothers.  Was brought up by his grandparents.  Two sons and a daughter that do not live with him.    Social Determinants of Health   Financial Resource Strain:   . Difficulty of Paying Living Expenses: Not on file  Food Insecurity:   . Worried About Programme researcher, broadcasting/film/video in the Last Year: Not on file  . Ran Out of Food in the Last Year: Not on file  Transportation Needs:   .  Lack of Transportation (Medical): Not on file  . Lack of Transportation (Non-Medical): Not on file  Physical Activity:   . Days of Exercise per Week: Not on file  . Minutes of Exercise per Session: Not on file  Stress:   . Feeling of Stress : Not on file  Social Connections:   . Frequency of Communication with Friends and Family: Not on file  . Frequency of Social Gatherings with Friends and Family: Not on file  . Attends Religious Services: Not on file  . Active Member of Clubs or Organizations: Not on file  . Attends Banker Meetings: Not on file  . Marital Status: Not on file  Intimate Partner Violence:   . Fear of Current or Ex-Partner: Not on file  . Emotionally Abused: Not on file  . Physically Abused: Not on file  . Sexually Abused: Not on file    Outpatient Medications Prior to Visit  Medication Sig Dispense Refill  . pantoprazole (PROTONIX) 40 MG tablet  Take 1 tablet (40 mg total) by mouth daily. 30 tablet 2   No facility-administered medications prior to visit.    No Known Allergies  ROS Review of Systems  Constitutional: Negative.   Respiratory: Negative.   Cardiovascular: Negative.   Gastrointestinal: Negative.   Skin: Negative.   Neurological: Negative.   Psychiatric/Behavioral: Negative.       Objective:    Physical Exam  Constitutional: He is oriented to person, place, and time. He appears well-developed and well-nourished.  HENT:  Head: Normocephalic and atraumatic.  Eyes: Pupils are equal, round, and reactive to light. EOM are normal.  Cardiovascular: Bradycardia present.  Pulmonary/Chest: Effort normal and breath sounds normal.  Abdominal: Soft. Bowel sounds are normal.  Neurological: He is alert and oriented to person, place, and time.  Skin: Skin is warm and dry.  Psychiatric: He has a normal mood and affect. His behavior is normal. Judgment and thought content normal.    BP 122/76   Pulse (!) 56   Temp (!) 97.1 F (36.2 C)   Ht 5\' 5"  (1.651 m)   Wt 155 lb 4.8 oz (70.4 kg)   SpO2 95%   BMI 25.84 kg/m  Wt Readings from Last 3 Encounters:  08/12/19 155 lb 4.8 oz (70.4 kg)  06/18/19 152 lb (68.9 kg)  04/29/19 140 lb 6.4 oz (63.7 kg)     Health Maintenance Due  Topic Date Due  . COLONOSCOPY  08/04/2015    There are no preventive care reminders to display for this patient.  Lab Results  Component Value Date   TSH 1.740 04/29/2019   Lab Results  Component Value Date   WBC 7.2 04/29/2019   HGB 14.4 04/29/2019   HCT 43.5 04/29/2019   MCV 89 04/29/2019   PLT 212 04/29/2019   Lab Results  Component Value Date   NA 142 04/29/2019   K 4.4 04/29/2019   CO2 27 04/29/2019   GLUCOSE 84 04/29/2019   BUN 12 04/29/2019   CREATININE 1.02 04/29/2019   BILITOT 0.3 04/29/2019   ALKPHOS 67 04/29/2019   AST 14 04/29/2019   ALT 14 04/29/2019   PROT 7.1 04/29/2019   ALBUMIN 4.6 04/29/2019   CALCIUM  9.8 04/29/2019   ANIONGAP 15 09/01/2017   Lab Results  Component Value Date   CHOL 177 04/29/2019   Lab Results  Component Value Date   HDL 58 04/29/2019   Lab Results  Component Value Date   LDLCALC 95 04/29/2019  Lab Results  Component Value Date   TRIG 136 04/29/2019   Lab Results  Component Value Date   CHOLHDL 3.1 04/29/2019   Lab Results  Component Value Date   HGBA1C 6.3 (H) 08/05/2019      Assessment & Plan:   1. H/O gastroesophageal reflux (GERD) - His acid reflux is under control and he will continue on current treatment regimen. - He was advised to Avoid spicy, fatty and fried food -Avoid sodas and sour juices -Avoid heavy meals -Avoid eating 4 hours before bedtime -Elevate head of bed at night - pantoprazole (PROTONIX) 40 MG tablet; Take 1 tablet (40 mg total) by mouth daily.  Dispense: 30 tablet; Refill: 3  2. Prediabetes - His HgbA1c was 6.3% and he was started on 500 mg Metformin. He was educated on medication side effects and advised to notify clinic. He was also advised to continue o low carb/non concentrated sweet diet and exercise as tolerated. - metFORMIN (GLUCOPHAGE) 1000 MG tablet; Take 0.5 tablets (500 mg total) by mouth daily with breakfast.  Dispense: 30 tablet; Refill: 0  3. Bradycardia - His heart rate was 56 bpm, he was advised to notify clinic for shortness of breath, chest pain and light headedness.     Follow-up: Return in about 1 month (around 09/09/2019), or if symptoms worsen or fail to improve.    Gwenivere Hiraldo Jerold Coombe, NP

## 2019-08-12 NOTE — Patient Instructions (Signed)
Bradycardia, Adult Bradycardia is a slower-than-normal heartbeat. A normal resting heart rate for an adult ranges from 60 to 100 beats per minute. With bradycardia, the resting heart rate is less than 60 beats per minute. Bradycardia can prevent enough oxygen from reaching certain areas of your body when you are active. It can be serious if it keeps enough oxygen from reaching your brain and other parts of your body. Bradycardia is not a problem for everyone. For some healthy adults, a slow resting heart rate is normal. What are the causes? This condition may be caused by:  A problem with the heart, including: ? A problem with the heart's electrical system, such as a heart block. With a heart block, electrical signals between the chambers of the heart are partially or completely blocked, so they are not able to work as they should. ? A problem with the heart's natural pacemaker (sinus node). ? Heart disease. ? A heart attack. ? Heart damage. ? Lyme disease. ? A heart infection. ? A heart condition that is present at birth (congenital heart defect).  Certain medicines that treat heart conditions.  Certain conditions, such as hypothyroidism and obstructive sleep apnea.  Problems with the balance of chemicals and other substances, like potassium, in the blood.  Trauma.  Radiation therapy. What increases the risk? You are more likely to develop this condition if you:  Are age 65 or older.  Have high blood pressure (hypertension), high cholesterol (hyperlipidemia), or diabetes.  Drink heavily, use tobacco or nicotine products, or use drugs. What are the signs or symptoms? Symptoms of this condition include:  Light-headedness.  Feeling faint or fainting.  Fatigue and weakness.  Trouble with activity or exercise.  Shortness of breath.  Chest pain (angina).  Drowsiness.  Confusion.  Dizziness. How is this diagnosed? This condition may be diagnosed based on:  Your  symptoms.  Your medical history.  A physical exam. During the exam, your health care provider will listen to your heartbeat and check your pulse. To confirm the diagnosis, your health care provider may order tests, such as:  Blood tests.  An electrocardiogram (ECG). This test records the heart's electrical activity. The test can show how fast your heart is beating and whether the heartbeat is steady.  A test in which you wear a portable device (event recorder or Holter monitor) to record your heart's electrical activity while you go about your day.  Anexercise test. How is this treated? Treatment for this condition depends on the cause of the condition and how severe your symptoms are. Treatment may involve:  Treatment of the underlying condition.  Changing your medicines or how much medicine you take.  Having a small, battery-operated device called a pacemaker implanted under the skin. When bradycardia occurs, this device can be used to increase your heart rate and help your heart beat in a regular rhythm. Follow these instructions at home: Lifestyle   Manage any health conditions that contribute to bradycardia as told by your health care provider.  Follow a heart-healthy diet. A nutrition specialist (dietitian) can help educate you about healthy food options and changes.  Follow an exercise program that is approved by your health care provider.  Maintain a healthy weight.  Try to reduce or manage your stress, such as with yoga or meditation. If you need help reducing stress, ask your health care provider.  Do not use any products that contain nicotine or tobacco, such as cigarettes, e-cigarettes, and chewing tobacco. If you need help   quitting, ask your health care provider.  Do not use illegal drugs.  Limit alcohol intake to no more than 1 drink a day for nonpregnant women and 2 drinks a day for men. Be aware of how much alcohol is in your drink. In the [place] one drink  equals one 12 oz bottle of beer (355 mL), one 5 oz glass of wine (148 mL), or one 1 oz glass of hard liquor (44 mL). General instructions  Take over-the-counter and prescription medicines only as told by your health care provider.  Keep all follow-up visits as told by your health care provider. This is important. How is this prevented? In some cases, bradycardia may be prevented by:  Treating underlying medical problems.  Stopping behaviors or medicines that can trigger the condition. Contact a health care provider if you:  Feel light-headed or dizzy.  Almost faint.  Feel weak or are easily fatigued during physical activity.  Experience confusion or have memory problems. Get help right away if:  You faint.  You have: ? An irregular heartbeat (palpitations). ? Chest pain. ? Trouble breathing. Summary  Bradycardia is a slower-than-normal heartbeat. With bradycardia, the resting heart rate is less than 60 beats per minute.  Treatment for this condition depends on the cause.  Manage any health conditions that contribute to bradycardia as told by your health care provider.  Do not use any products that contain nicotine or tobacco, such as cigarettes, e-cigarettes, and chewing tobacco, and limit alcohol intake.  Keep all follow-up visits as told by your health care provider. This is important. This information is not intended to replace advice given to you by your health care provider. Make sure you discuss any questions you have with your health care provider. Document Revised: 12/30/2017 Document Reviewed: 11/27/2017 Elsevier Patient Education  2020 [employer]. Carbohydrate Counting for Diabetes Mellitus, Adult  Carbohydrate counting is a method of keeping track of how many carbohydrates you eat. Eating carbohydrates naturally increases the amount of sugar (glucose) in the blood. Counting how many carbohydrates you eat helps keep your blood glucose within normal limits,  which helps you manage your diabetes (diabetes mellitus). It is important to know how many carbohydrates you can safely have in each meal. This is different for every person. A diet and nutrition specialist (registered dietitian) can help you make a meal plan and calculate how many carbohydrates you should have at each meal and snack. Carbohydrates are found in the following foods:  Grains, such as breads and cereals.  Dried beans and soy products.  Starchy vegetables, such as potatoes, peas, and corn.  Fruit and fruit juices.  Milk and yogurt.  Sweets and snack foods, such as cake, cookies, candy, chips, and soft drinks. How do I count carbohydrates? There are two ways to count carbohydrates in food. You can use either of the methods or a combination of both. Reading "Nutrition Facts" on packaged food The "Nutrition Facts" list is included on the labels of almost all packaged foods and beverages in the [place] It includes:  The serving size.  Information about nutrients in each serving, including the grams (g) of carbohydrate per serving. To use the "Nutrition Facts":  Decide how many servings you will have.  Multiply the number of servings by the number of carbohydrates per serving.  The resulting number is the total amount of carbohydrates that you will be having. Learning standard serving sizes of other foods When you eat carbohydrate foods that are not packaged or do  not include "Nutrition Facts" on the label, you need to measure the servings in order to count the amount of carbohydrates:  Measure the foods that you will eat with a food scale or measuring cup, if needed.  Decide how many standard-size servings you will eat.  Multiply the number of servings by 15. Most carbohydrate-rich foods have about 15 g of carbohydrates per serving. ? For example, if you eat 8 oz (170 g) of strawberries, you will have eaten 2 servings and 30 g of carbohydrates (2 servings x 15 g = 30  g).  For foods that have more than one food mixed, such as soups and casseroles, you must count the carbohydrates in each food that is included. The following list contains standard serving sizes of common carbohydrate-rich foods. Each of these servings has about 15 g of carbohydrates:   hamburger bun or  English muffin.   oz (15 mL) syrup.   oz (14 g) jelly.  1 slice of bread.  1 six-inch tortilla.  3 oz (85 g) cooked rice or pasta.  4 oz (113 g) cooked dried beans.  4 oz (113 g) starchy vegetable, such as peas, corn, or potatoes.  4 oz (113 g) hot cereal.  4 oz (113 g) mashed potatoes or  of a large baked potato.  4 oz (113 g) canned or frozen fruit.  4 oz (120 mL) fruit juice.  4-6 crackers.  6 chicken nuggets.  6 oz (170 g) unsweetened dry cereal.  6 oz (170 g) plain fat-free yogurt or yogurt sweetened with artificial sweeteners.  8 oz (240 mL) milk.  8 oz (170 g) fresh fruit or one small piece of fruit.  24 oz (680 g) popped popcorn. Example of carbohydrate counting Sample meal  3 oz (85 g) chicken breast.  6 oz (170 g) brown rice.  4 oz (113 g) corn.  8 oz (240 mL) milk.  8 oz (170 g) strawberries with sugar-free whipped topping. Carbohydrate calculation 1. Identify the foods that contain carbohydrates: ? Rice. ? Corn. ? Milk. ? Strawberries. 2. Calculate how many servings you have of each food: ? 2 servings rice. ? 1 serving corn. ? 1 serving milk. ? 1 serving strawberries. 3. Multiply each number of servings by 15 g: ? 2 servings rice x 15 g = 30 g. ? 1 serving corn x 15 g = 15 g. ? 1 serving milk x 15 g = 15 g. ? 1 serving strawberries x 15 g = 15 g. 4. Add together all of the amounts to find the total grams of carbohydrates eaten: ? 30 g + 15 g + 15 g + 15 g = 75 g of carbohydrates total. Summary  Carbohydrate counting is a method of keeping track of how many carbohydrates you eat.  Eating carbohydrates naturally increases the  amount of sugar (glucose) in the blood.  Counting how many carbohydrates you eat helps keep your blood glucose within normal limits, which helps you manage your diabetes.  A diet and nutrition specialist (registered dietitian) can help you make a meal plan and calculate how many carbohydrates you should have at each meal and snack. This information is not intended to replace advice given to you by your health care provider. Make sure you discuss any questions you have with your health care provider. Document Revised: 01/10/2017 Document Reviewed: 11/30/2015 Elsevier Patient Education  [place].

## 2019-08-18 ENCOUNTER — Ambulatory Visit: Payer: Self-pay | Admitting: Specialist

## 2019-08-18 ENCOUNTER — Other Ambulatory Visit: Payer: Self-pay

## 2019-08-18 DIAGNOSIS — M19011 Primary osteoarthritis, right shoulder: Secondary | ICD-10-CM

## 2019-08-18 NOTE — Progress Notes (Signed)
   Subjective:    Patient ID: William Wood, male    DOB: 03/12/1966, 54 y.o.   MRN: [id number]  HPI  Main complaint is over the R trapezius. Review of x rays are normal, although radiologists read the R side of having mild DJD.   Review of Systems     Objective:   Physical Exam  Deferred      Assessment & Plan:   Continue with PT as planned. Return in prn.

## 2019-09-01 ENCOUNTER — Ambulatory Visit: Payer: Self-pay | Admitting: Gastroenterology

## 2019-09-02 ENCOUNTER — Other Ambulatory Visit: Payer: Self-pay

## 2019-09-02 ENCOUNTER — Ambulatory Visit (INDEPENDENT_AMBULATORY_CARE_PROVIDER_SITE_OTHER): Payer: Self-pay | Admitting: Gastroenterology

## 2019-09-02 VITALS — BP 132/86 | HR 54 | Temp 97.8°F | Ht 65.0 in | Wt 154.8 lb

## 2019-09-02 DIAGNOSIS — K219 Gastro-esophageal reflux disease without esophagitis: Secondary | ICD-10-CM

## 2019-09-02 DIAGNOSIS — R7303 Prediabetes: Secondary | ICD-10-CM

## 2019-09-02 DIAGNOSIS — Z1211 Encounter for screening for malignant neoplasm of colon: Secondary | ICD-10-CM

## 2019-09-02 NOTE — Progress Notes (Signed)
Wyline Mood MD, MRCP(U.K) [address]  [address]  [place], [place] [address]  Main: [phone]  Fax: [phone]   Gastroenterology Consultation  Referring Provider:     Rolm Gala, NP Primary Care Physician:  Rolm Gala, NP Primary Gastroenterologist:  Dr. Wyline Mood  Reason for Consultation:     GERD and colonoscopy for colon cancer screening        HPI:   William Wood is a 54 y.o. y/o male referred for consultation & management  by Rolm Gala, NP.     He has had acid reflux for over 10 to 15 years.  Main symptoms are belching and heartburn.  He has been taking Protonix for some years.  With Protonix his symptoms are reasonably well controlled.  When he goes off Protonix he has significant worsening of symptoms.  Denies any other symptoms.  No upper GI evaluation in the past.  Not on any blood thinners.  He has gained 20 pounds of weight recently.  This has made his acid reflux worse.  Never had a colonoscopy.  No family history of colon cancer or polyps.  No rectal bleeding or change in stool caliber or change in bowel habits.  Past Medical History:  Diagnosis Date  . Alcohol abuse 11/23/2010  . Allergic rhinitis 11/07/2010  . Anxiety   . Bradycardia   . Cocaine abuse (HCC) 11/23/2010  . Congenital fusion of cervical spine    c5-c6  . DDD (degenerative disc disease), cervical    c6-c7  . Depression   . GERD (gastroesophageal reflux disease)   . Tobacco abuse 11/23/2010    Past Surgical History:  Procedure Laterality Date  . CARDIAC CATHETERIZATION  2014    Prior to Admission medications   Medication Sig Start Date End Date Taking? Authorizing Provider  metFORMIN (GLUCOPHAGE) 1000 MG tablet Take 0.5 tablets (500 mg total) by mouth daily with breakfast. 08/12/19   Iloabachie, Chioma E, NP  pantoprazole (PROTONIX) 40 MG tablet Take 1 tablet (40 mg total) by mouth daily. 08/12/19   Iloabachie, Chioma E, NP    Family History  Problem  Relation Age of Onset  . Stroke Paternal Aunt   . Congestive Heart Failure Paternal Grandmother        '  . Diabetes Other        maternal side of family  . Cancer Other        grandfather  . Alcohol abuse Brother   . Alcohol abuse Brother   . Alcohol abuse Brother      Social History   Tobacco Use  . Smoking status: Former Smoker    Packs/day: 0.50    Types: Cigarettes    Quit date: 06/02/2019    Years since quitting: 0.2  . Smokeless tobacco: Never Used  Substance Use Topics  . Alcohol use: Not Currently    Comment: in rehab  . Drug use: Not Currently    Types: "Crack" cocaine, Cocaine, Marijuana    Comment: in rehab    Allergies as of 09/02/2019  . (No Known Allergies)    Review of Systems:    All systems reviewed and negative except where noted in HPI.   Physical Exam:  There were no vitals taken for this visit. No LMP for male patient. Psych:  Alert and cooperative. Normal mood and affect. General:   Alert,  Well-developed, well-nourished, pleasant and cooperative in NAD Head:  Normocephalic and atraumatic. Eyes:  Sclera clear, no  icterus.   Conjunctiva pink. Lungs:  Respirations even and unlabored.  Clear throughout to auscultation.   No wheezes, crackles, or rhonchi. No acute distress. Heart:  Regular rate and rhythm; no murmurs, clicks, rubs, or gallops. Abdomen:  Normal bowel sounds.  No bruits.  Soft, non-tender and non-distended without masses, hepatosplenomegaly or hernias noted.  No guarding or rebound tenderness.    NBeurologic:  Alert and oriented x3;  grossly normal neurologically. Psych:  Alert and cooperative. Normal mood and affect.  Imaging Studies: No results found.  Assessment and Plan:   William Wood is a 54 y.o. y/o male has been referred for GERD and colon cancer screening.  Plan 1.  Colonoscopy 2. GERD : Counseled on life style changes, suggest to use PPI first thing in the morning on empty stomach and eat 30 minutes after. Advised  on the use of a wedge pillow at night , avoid meals for 2 hours prior to bed time. Weight loss  3.  EGD to screen for Barrett's esophagus. 4.  He would like to lose weight and would like referral to a dietitian.    I have discussed alternative options, risks & benefits,  which include, but are not limited to, bleeding, infection, perforation,respiratory complication & drug reaction.  The patient agrees with this plan & written consent will be obtained.     Follow up in 6 months  Dr [name] MD,MRCP(U.K)

## 2019-09-02 NOTE — Patient Instructions (Signed)

## 2019-09-09 ENCOUNTER — Encounter: Payer: Self-pay | Admitting: Gerontology

## 2019-09-09 ENCOUNTER — Other Ambulatory Visit: Payer: Self-pay

## 2019-09-09 ENCOUNTER — Ambulatory Visit: Payer: Self-pay | Admitting: Gerontology

## 2019-09-09 VITALS — BP 112/72 | HR 49 | Temp 97.3°F | Ht 65.0 in | Wt 154.7 lb

## 2019-09-09 DIAGNOSIS — Z8719 Personal history of other diseases of the digestive system: Secondary | ICD-10-CM

## 2019-09-09 DIAGNOSIS — R7303 Prediabetes: Secondary | ICD-10-CM

## 2019-09-09 DIAGNOSIS — R001 Bradycardia, unspecified: Secondary | ICD-10-CM

## 2019-09-09 MED ORDER — METFORMIN HCL 1000 MG PO TABS: 500.0000 mg | ORAL_TABLET | Freq: Every day | ORAL | 1 refills | Status: DC

## 2019-09-09 NOTE — Patient Instructions (Signed)
Prediabetes Eating Plan Prediabetes is a condition that causes blood sugar (glucose) levels to be higher than normal. This increases the risk for developing diabetes. In order to prevent diabetes from developing, your health care provider may recommend a diet and other lifestyle changes to help you:  Control your blood glucose levels.  Improve your cholesterol levels.  Manage your blood pressure. Your health care provider may recommend working with a diet and nutrition specialist (dietitian) to make a meal plan that is best for you. What are tips for following this plan? Lifestyle  Set weight loss goals with the help of your health care team. It is recommended that most people with prediabetes lose 7% of their current body weight.  Exercise for at least 30 minutes at least 5 days a week.  Attend a support group or seek ongoing support from a mental health counselor.  Take over-the-counter and prescription medicines only as told by your health care provider. Reading food labels  Read food labels to check the amount of fat, salt (sodium), and sugar in prepackaged foods. Avoid foods that have: ? Saturated fats. ? Trans fats. ? Added sugars.  Avoid foods that have more than 300 milligrams (mg) of sodium per serving. Limit your daily sodium intake to less than 2,300 mg each day. Shopping  Avoid buying pre-made and processed foods. Cooking  Cook with olive oil. Do not use butter, lard, or ghee.  Bake, broil, grill, or boil foods. Avoid frying. Meal planning   Work with your dietitian to develop an eating plan that is right for you. This may include: ? Tracking how many calories you take in. Use a food diary, notebook, or mobile application to track what you eat at each meal. ? Using the glycemic index (GI) to plan your meals. The index tells you how quickly a food will raise your blood glucose. Choose low-GI foods. These foods take a longer time to raise blood glucose.  Consider  following a Mediterranean diet. This diet includes: ? Several servings each day of fresh fruits and vegetables. ? Eating fish at least twice a week. ? Several servings each day of whole grains, beans, nuts, and seeds. ? Using olive oil instead of other fats. ? Moderate alcohol consumption. ? Eating small amounts of red meat and whole-fat dairy.  If you have high blood pressure, you may need to limit your sodium intake or follow a diet such as the DASH eating plan. DASH is an eating plan that aims to lower high blood pressure. What foods are recommended? The items listed below may not be a complete list. Talk with your dietitian about what dietary choices are best for you. Grains Whole grains, such as whole-wheat or whole-grain breads, crackers, cereals, and pasta. Unsweetened oatmeal. Bulgur. Barley. Quinoa. Brown rice. Corn or whole-wheat flour tortillas or taco shells. Vegetables Lettuce. Spinach. Peas. Beets. Cauliflower. Cabbage. Broccoli. Carrots. Tomatoes. Squash. Eggplant. Herbs. Peppers. Onions. Cucumbers. Brussels sprouts. Fruits Berries. Bananas. Apples. Oranges. Grapes. Papaya. Mango. Pomegranate. Kiwi. Grapefruit. Cherries. Meats and other protein foods Seafood. Poultry without skin. Lean cuts of pork and beef. Tofu. Eggs. Nuts. Beans. Dairy Low-fat or fat-free dairy products, such as yogurt, cottage cheese, and cheese. Beverages Water. Tea. Coffee. Sugar-free or diet soda. Seltzer water. Lowfat or no-fat milk. Milk alternatives, such as soy or almond milk. Fats and oils Olive oil. Canola oil. Sunflower oil. Grapeseed oil. Avocado. Walnuts. Sweets and desserts Sugar-free or low-fat pudding. Sugar-free or low-fat ice cream and other frozen treats.   Seasoning and other foods Herbs. Sodium-free spices. Mustard. Relish. Low-fat, low-sugar ketchup. Low-fat, low-sugar barbecue sauce. Low-fat or fat-free mayonnaise. What foods are not recommended? The items listed below may not be a  complete list. Talk with your dietitian about what dietary choices are best for you. Grains Refined white flour and flour products, such as bread, pasta, snack foods, and cereals. Vegetables Canned vegetables. Frozen vegetables with butter or cream sauce. Fruits Fruits canned with syrup. Meats and other protein foods Fatty cuts of meat. Poultry with skin. Breaded or fried meat. Processed meats. Dairy Full-fat yogurt, cheese, or milk. Beverages Sweetened drinks, such as sweet iced tea and soda. Fats and oils Butter. Lard. Ghee. Sweets and desserts Baked goods, such as cake, cupcakes, pastries, cookies, and cheesecake. Seasoning and other foods Spice mixes with added salt. Ketchup. Barbecue sauce. Mayonnaise. Summary  To prevent diabetes from developing, you may need to make diet and other lifestyle changes to help control blood sugar, improve cholesterol levels, and manage your blood pressure.  Set weight loss goals with the help of your health care team. It is recommended that most people with prediabetes lose 7 percent of their current body weight.  Consider following a Mediterranean diet that includes plenty of fresh fruits and vegetables, whole grains, beans, nuts, seeds, fish, lean meat, low-fat dairy, and healthy oils. This information is not intended to replace advice given to you by your health care provider. Make sure you discuss any questions you have with your health care provider. Document Revised: 10/10/2018 Document Reviewed: 08/22/2016 Elsevier Patient Education  2020 Elsevier Inc.  Bradycardia, Adult Bradycardia is a slower-than-normal heartbeat. A normal resting heart rate for an adult ranges from 60 to 100 beats per minute. With bradycardia, the resting heart rate is less than 60 beats per minute. Bradycardia can prevent enough oxygen from reaching certain areas of your body when you are active. It can be serious if it keeps enough oxygen from reaching your brain and  other parts of your body. Bradycardia is not a problem for everyone. For some healthy adults, a slow resting heart rate is normal. What are the causes? This condition may be caused by:  A problem with the heart, including: ? A problem with the heart's electrical system, such as a heart block. With a heart block, electrical signals between the chambers of the heart are partially or completely blocked, so they are not able to work as they should. ? A problem with the heart's natural pacemaker (sinus node). ? Heart disease. ? A heart attack. ? Heart damage. ? Lyme disease. ? A heart infection. ? A heart condition that is present at birth (congenital heart defect).  Certain medicines that treat heart conditions.  Certain conditions, such as hypothyroidism and obstructive sleep apnea.  Problems with the balance of chemicals and other substances, like potassium, in the blood.  Trauma.  Radiation therapy. What increases the risk? You are more likely to develop this condition if you:  Are age 108 or older.  Have high blood pressure (hypertension), high cholesterol (hyperlipidemia), or diabetes.  Drink heavily, use tobacco or nicotine products, or use drugs. What are the signs or symptoms? Symptoms of this condition include:  Light-headedness.  Feeling faint or fainting.  Fatigue and weakness.  Trouble with activity or exercise.  Shortness of breath.  Chest pain (angina).  Drowsiness.  Confusion.  Dizziness. How is this diagnosed? This condition may be diagnosed based on:  Your symptoms.  Your medical history.  A  physical exam. During the exam, your health care provider will listen to your heartbeat and check your pulse. To confirm the diagnosis, your health care provider may order tests, such as:  Blood tests.  An electrocardiogram (ECG). This test records the heart's electrical activity. The test can show how fast your heart is beating and whether the heartbeat  is steady.  A test in which you wear a portable device (event recorder or Holter monitor) to record your heart's electrical activity while you go about your day.  Anexercise test. How is this treated? Treatment for this condition depends on the cause of the condition and how severe your symptoms are. Treatment may involve:  Treatment of the underlying condition.  Changing your medicines or how much medicine you take.  Having a small, battery-operated device called a pacemaker implanted under the skin. When bradycardia occurs, this device can be used to increase your heart rate and help your heart beat in a regular rhythm. Follow these instructions at home: Lifestyle   Manage any health conditions that contribute to bradycardia as told by your health care provider.  Follow a heart-healthy diet. A nutrition specialist (dietitian) can help educate you about healthy food options and changes.  Follow an exercise program that is approved by your health care provider.  Maintain a healthy weight.  Try to reduce or manage your stress, such as with yoga or meditation. If you need help reducing stress, ask your health care provider.  Do not use any products that contain nicotine or tobacco, such as cigarettes, e-cigarettes, and chewing tobacco. If you need help quitting, ask your health care provider.  Do not use illegal drugs.  Limit alcohol intake to no more than 1 drink a day for nonpregnant women and 2 drinks a day for men. Be aware of how much alcohol is in your drink. In the [place] one drink equals one 12 oz bottle of beer (355 mL), one 5 oz glass of wine (148 mL), or one 1 oz glass of hard liquor (44 mL). General instructions  Take over-the-counter and prescription medicines only as told by your health care provider.  Keep all follow-up visits as told by your health care provider. This is important. How is this prevented? In some cases, bradycardia may be prevented by:  Treating  underlying medical problems.  Stopping behaviors or medicines that can trigger the condition. Contact a health care provider if you:  Feel light-headed or dizzy.  Almost faint.  Feel weak or are easily fatigued during physical activity.  Experience confusion or have memory problems. Get help right away if:  You faint.  You have: ? An irregular heartbeat (palpitations). ? Chest pain. ? Trouble breathing. Summary  Bradycardia is a slower-than-normal heartbeat. With bradycardia, the resting heart rate is less than 60 beats per minute.  Treatment for this condition depends on the cause.  Manage any health conditions that contribute to bradycardia as told by your health care provider.  Do not use any products that contain nicotine or tobacco, such as cigarettes, e-cigarettes, and chewing tobacco, and limit alcohol intake.  Keep all follow-up visits as told by your health care provider. This is important. This information is not intended to replace advice given to you by your health care provider. Make sure you discuss any questions you have with your health care provider. Document Revised: 12/30/2017 Document Reviewed: 11/27/2017 Elsevier Patient Education  [place].

## 2019-09-09 NOTE — Progress Notes (Signed)
Established Patient Office Visit  Subjective:  Patient ID: William Wood, male    DOB: 04/16/1966  Age: 54 y.o. MRN: [id number]  CC:  Chief Complaint  Patient presents with  . Follow-up    HPI SHANON BECVAR presents for follow up of GERD, Prediabetes and Bradycardia. He states that he's compliant with his medications, and his acid reflux is under control with taking Protonix 40 mg daily. His HgbA1c done on 08/05/2019 was 6.3% and he states that he tolerates 500 mg Metformin daily, and continues to make healthy food choices. His heart rate during visit was 49 b.p.m and he denies shortness of breath, dizziness nor fatigue. He states that his Colonoscopy screening is scheduled for 09/15/2019. Overall, he states that he's doing well and offers no further complaint.  Past Medical History:  Diagnosis Date  . Alcohol abuse 11/23/2010  . Allergic rhinitis 11/07/2010  . Anxiety   . Bradycardia   . Cocaine abuse ([place]) 11/23/2010  . Congenital fusion of cervical spine    c5-c6  . DDD (degenerative disc disease), cervical    c6-c7  . Depression   . GERD (gastroesophageal reflux disease)   . Tobacco abuse 11/23/2010    Past Surgical History:  Procedure Laterality Date  . CARDIAC CATHETERIZATION  2014    Family History  Problem Relation Age of Onset  . Stroke Paternal Aunt   . Congestive Heart Failure Paternal Grandmother        '  . Diabetes Other        maternal side of family  . Cancer Other        grandfather  . Alcohol abuse Brother   . Alcohol abuse Brother   . Alcohol abuse Brother     Social History   Socioeconomic History  . Marital status: Married    Spouse name: Not on file  . Number of children: Not on file  . Years of education: Not on file  . Highest education level: Not on file  Occupational History  . Not on file  Tobacco Use  . Smoking status: Former Smoker    Packs/day: 0.50    Types: Cigarettes    Quit date: 06/02/2019    Years since quitting: 0.2  .  Smokeless tobacco: Never Used  Substance and Sexual Activity  . Alcohol use: Not Currently    Comment: in rehab  . Drug use: Not Currently    Types: "Crack" cocaine, Cocaine, Marijuana    Comment: in rehab  . Sexual activity: Yes    Birth control/protection: None  Other Topics Concern  . 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  Was imprisoned in 1995 for 8.5 years for robbery with a deadly weapon.  Mother shot and killed his father while pregnant with him.  Mother was shot and killed when he was 46.  Some trade school while imprisoned.  Live in [place] with his uncle.  He has 2 sisters and 6 brothers.  Was brought up by his grandparents.  Two sons and a daughter that do not live with him.    Social Determinants of Health   Financial Resource Strain:   . Difficulty of Paying Living Expenses: Not on file  Food Insecurity:   . Worried About Charity fundraiser in the Last Year: Not on file  . Ran Out of Food in the Last Year: Not on file  Transportation Needs:   .  Lack of Transportation (Medical): Not on file  . Lack of Transportation (Non-Medical): Not on file  Physical Activity:   . Days of Exercise per Week: Not on file  . Minutes of Exercise per Session: Not on file  Stress:   . Feeling of Stress : Not on file  Social Connections:   . Frequency of Communication with Friends and Family: Not on file  . Frequency of Social Gatherings with Friends and Family: Not on file  . Attends Religious Services: Not on file  . Active Member of Clubs or Organizations: Not on file  . Attends Banker Meetings: Not on file  . Marital Status: Not on file  Intimate Partner Violence:   . Fear of Current or Ex-Partner: Not on file  . Emotionally Abused: Not on file  . Physically Abused: Not on file  . Sexually Abused: Not on file    Outpatient Medications Prior to Visit  Medication Sig Dispense Refill  .  metFORMIN (GLUCOPHAGE) 1000 MG tablet Take 0.5 tablets (500 mg total) by mouth daily with breakfast. 30 tablet 1  . pantoprazole (PROTONIX) 40 MG tablet Take 1 tablet (40 mg total) by mouth daily. 30 tablet 3   No facility-administered medications prior to visit.    No Known Allergies  ROS Review of Systems  Constitutional: Negative.   Respiratory: Negative.   Cardiovascular: Negative.   Gastrointestinal: Negative.   Neurological: Negative.   Psychiatric/Behavioral: Negative.       Objective:    Physical Exam  Constitutional: He is oriented to person, place, and time. He appears well-developed.  HENT:  Head: Normocephalic and atraumatic.  Cardiovascular: Regular rhythm. Bradycardia present.  Pulmonary/Chest: Effort normal and breath sounds normal.  Neurological: He is alert and oriented to person, place, and time.  Psychiatric: He has a normal mood and affect. His behavior is normal. Judgment and thought content normal.    BP 112/72 (BP Location: Left Arm, Patient Position: Sitting, Cuff Size: Small)   Pulse (!) 49   Temp (!) 97.3 F (36.3 C)   Ht 5\' 5"  (1.651 m)   Wt 154 lb 11.2 oz (70.2 kg)   SpO2 98%   BMI 25.74 kg/m  Wt Readings from Last 3 Encounters:  09/09/19 154 lb 11.2 oz (70.2 kg)  09/02/19 154 lb 12.8 oz (70.2 kg)  08/12/19 155 lb 4.8 oz (70.4 kg)     Health Maintenance Due  Topic Date Due  . COLONOSCOPY  08/04/2015    There are no preventive care reminders to display for this patient.  Lab Results  Component Value Date   TSH 1.740 04/29/2019   Lab Results  Component Value Date   WBC 7.2 04/29/2019   HGB 14.4 04/29/2019   HCT 43.5 04/29/2019   MCV 89 04/29/2019   PLT 212 04/29/2019   Lab Results  Component Value Date   NA 142 04/29/2019   K 4.4 04/29/2019   CO2 27 04/29/2019   GLUCOSE 84 04/29/2019   BUN 12 04/29/2019   CREATININE 1.02 04/29/2019   BILITOT 0.3 04/29/2019   ALKPHOS 67 04/29/2019   AST 14 04/29/2019   ALT 14  04/29/2019   PROT 7.1 04/29/2019   ALBUMIN 4.6 04/29/2019   CALCIUM 9.8 04/29/2019   ANIONGAP 15 09/01/2017   Lab Results  Component Value Date   CHOL 177 04/29/2019   Lab Results  Component Value Date   HDL 58 04/29/2019   Lab Results  Component Value Date   LDLCALC  95 04/29/2019   Lab Results  Component Value Date   TRIG 136 04/29/2019   Lab Results  Component Value Date   CHOLHDL 3.1 04/29/2019   Lab Results  Component Value Date   HGBA1C 6.3 (H) 08/05/2019      Assessment & Plan:   1. H/O gastroesophageal reflux (GERD) - His acid reflux is under control and he will continue on 40 mg Protonix. -Avoid spicy, fatty and fried food -Avoid sodas and sour juices -Avoid heavy meals -Avoid eating 4 hours before bedtime -Elevate head of bed at night    2. Prediabetes - His HgbA1c was 6.3% and he will continue on Metformin 500 mg daily. He was advised to continue on low carb/non concentrated sweet diet. - HgB A1c; Future  3. Bradycardia - His heart rate was 45 b p m, he was referred for EKG and Cardiology. - EKG 12-Lead - Ambulatory referral to Cardiology     Follow-up: Return in about 9 weeks (around 11/11/2019).    Gianno Volner Trellis Paganini, NP

## 2019-09-11 ENCOUNTER — Other Ambulatory Visit
Admission: RE | Admit: 2019-09-11 | Discharge: 2019-09-11 | Disposition: A | Discharge status: Home / Self Care | Payer: HRSA Program | Source: Ambulatory Visit | Attending: Gastroenterology | Admitting: Gastroenterology

## 2019-09-11 ENCOUNTER — Other Ambulatory Visit: Payer: Self-pay

## 2019-09-11 DIAGNOSIS — Z20822 Contact with and (suspected) exposure to covid-19: Secondary | ICD-10-CM | POA: Diagnosis not present

## 2019-09-11 DIAGNOSIS — Z01812 Encounter for preprocedural laboratory examination: Secondary | ICD-10-CM | POA: Insufficient documentation

## 2019-09-12 LAB — SARS CORONAVIRUS 2 (TAT 6-24 HRS): SARS Coronavirus 2: NEGATIVE

## 2019-09-14 ENCOUNTER — Encounter: Payer: Self-pay | Admitting: Gastroenterology

## 2019-09-15 ENCOUNTER — Ambulatory Visit: Payer: Self-pay | Admitting: Certified Registered Nurse Anesthetist

## 2019-09-15 ENCOUNTER — Ambulatory Visit
Admission: RE | Admit: 2019-09-15 | Discharge: 2019-09-15 | Disposition: A | Discharge status: Home / Self Care | Payer: Self-pay | Attending: Gastroenterology | Admitting: Gastroenterology

## 2019-09-15 ENCOUNTER — Other Ambulatory Visit: Payer: Self-pay

## 2019-09-15 ENCOUNTER — Encounter
Admission: RE | Disposition: A | Discharge status: Home / Self Care | Payer: Self-pay | Source: Home / Self Care | Attending: Gastroenterology

## 2019-09-15 ENCOUNTER — Encounter: Payer: Self-pay | Admitting: Gastroenterology

## 2019-09-15 DIAGNOSIS — Z1211 Encounter for screening for malignant neoplasm of colon: Secondary | ICD-10-CM | POA: Insufficient documentation

## 2019-09-15 DIAGNOSIS — K219 Gastro-esophageal reflux disease without esophagitis: Secondary | ICD-10-CM | POA: Insufficient documentation

## 2019-09-15 DIAGNOSIS — Z13810 Encounter for screening for upper gastrointestinal disorder: Secondary | ICD-10-CM | POA: Insufficient documentation

## 2019-09-15 DIAGNOSIS — E119 Type 2 diabetes mellitus without complications: Secondary | ICD-10-CM | POA: Insufficient documentation

## 2019-09-15 DIAGNOSIS — Z79899 Other long term (current) drug therapy: Secondary | ICD-10-CM | POA: Insufficient documentation

## 2019-09-15 DIAGNOSIS — Z7984 Long term (current) use of oral hypoglycemic drugs: Secondary | ICD-10-CM | POA: Insufficient documentation

## 2019-09-15 DIAGNOSIS — Z87891 Personal history of nicotine dependence: Secondary | ICD-10-CM | POA: Insufficient documentation

## 2019-09-15 HISTORY — PX: ESOPHAGOGASTRODUODENOSCOPY (EGD) WITH PROPOFOL: SHX5813

## 2019-09-15 HISTORY — PX: COLONOSCOPY WITH PROPOFOL: SHX5780

## 2019-09-15 HISTORY — DX: Type 2 diabetes mellitus without complications: E11.9

## 2019-09-15 LAB — URINE DRUG SCREEN, QUALITATIVE (ARMC ONLY)
Amphetamines, Ur Screen: NOT DETECTED
Barbiturates, Ur Screen: NOT DETECTED
Benzodiazepine, Ur Scrn: NOT DETECTED
Cannabinoid 50 Ng, Ur ~~LOC~~: NOT DETECTED
Cocaine Metabolite,Ur ~~LOC~~: NOT DETECTED
MDMA (Ecstasy)Ur Screen: NOT DETECTED
Methadone Scn, Ur: NOT DETECTED
Opiate, Ur Screen: NOT DETECTED
Phencyclidine (PCP) Ur S: NOT DETECTED
Tricyclic, Ur Screen: NOT DETECTED

## 2019-09-15 SURGERY — COLONOSCOPY WITH PROPOFOL
Anesthesia: General

## 2019-09-15 MED ORDER — PROPOFOL 10 MG/ML IV BOLUS
INTRAVENOUS | Status: AC
  Filled 2019-09-15: qty 80

## 2019-09-15 MED ORDER — LIDOCAINE HCL (CARDIAC) PF 100 MG/5ML IV SOSY
PREFILLED_SYRINGE | INTRAVENOUS | Status: DC | PRN
  Administered 2019-09-15: 100 mg via INTRAVENOUS

## 2019-09-15 MED ORDER — SODIUM CHLORIDE 0.9 % IV SOLN
INTRAVENOUS | Status: DC
  Administered 2019-09-15: 1000 mL via INTRAVENOUS

## 2019-09-15 MED ORDER — PROPOFOL 500 MG/50ML IV EMUL
INTRAVENOUS | Status: AC
  Filled 2019-09-15: qty 50

## 2019-09-15 MED ORDER — PROPOFOL 500 MG/50ML IV EMUL
INTRAVENOUS | Status: DC | PRN
  Administered 2019-09-15: 80 ug/kg/min via INTRAVENOUS

## 2019-09-15 MED ORDER — LIDOCAINE HCL (PF) 2 % IJ SOLN
INTRAMUSCULAR | Status: AC
  Filled 2019-09-15: qty 5

## 2019-09-15 MED ORDER — GLYCOPYRROLATE 0.2 MG/ML IJ SOLN
INTRAMUSCULAR | Status: DC | PRN
  Administered 2019-09-15: .2 mg via INTRAVENOUS

## 2019-09-15 NOTE — Anesthesia Preprocedure Evaluation (Signed)
Anesthesia Evaluation  Patient identified by MRN, date of birth, ID band Patient awake    Reviewed: Allergy & Precautions, NPO status , Patient's Chart, lab work & pertinent test results  History of Anesthesia Complications Negative for: history of anesthetic complications  Airway Mallampati: II  TM Distance: >3 FB Neck ROM: Full    Dental no notable dental hx. (+) Teeth Intact   Pulmonary neg pulmonary ROS, neg sleep apnea, neg COPD, Patient abstained from smoking.Not current smoker, former smoker,    Pulmonary exam normal breath sounds clear to auscultation       Cardiovascular Exercise Tolerance: Good METS(-) hypertension(-) CAD and (-) Past MI negative cardio ROS  (-) dysrhythmias  Rhythm:Regular Rate:Bradycardia - Systolic murmurs    Neuro/Psych PSYCHIATRIC DISORDERS Anxiety Depression negative neurological ROS     GI/Hepatic GERD  Medicated and Controlled,(+)     (-) substance abuse  , 6 months sober from cocaine use   Endo/Other  diabetes  Renal/GU negative Renal ROS     Musculoskeletal   Abdominal   Peds  Hematology   Anesthesia Other Findings Past Medical History: 11/23/2010: Alcohol abuse 11/07/2010: Allergic rhinitis No date: Anxiety No date: Bradycardia 11/23/2010: Cocaine abuse ([facility]) No date: Congenital fusion of cervical spine     Comment:  c5-c6 No date: DDD (degenerative disc disease), cervical     Comment:  c6-c7 No date: Depression No date: Diabetes mellitus without complication ([facility]) No date: GERD (gastroesophageal reflux disease) 11/23/2010: Tobacco abuse  Reproductive/Obstetrics                            Anesthesia Physical Anesthesia Plan  ASA: II  Anesthesia Plan: General   Post-op Pain Management:    Induction: Intravenous  PONV Risk Score and Plan: 2 and Ondansetron, Propofol infusion and TIVA  Airway Management Planned: Nasal  Cannula  Additional Equipment: None  Intra-op Plan:   Post-operative Plan:   Informed Consent: I have reviewed the patients History and Physical, chart, labs and discussed the procedure including the risks, benefits and alternatives for the proposed anesthesia with the patient or authorized representative who has indicated his/her understanding and acceptance.     Dental advisory given  Plan Discussed with: CRNA and Surgeon  Anesthesia Plan Comments: (Discussed risks of anesthesia with patient, including possibility of difficulty with spontaneous ventilation under anesthesia necessitating airway intervention, PONV, and rare risks such as cardiac or respiratory or neurological events. Patient understands.  Pending UDS since last UDS in system was positive for cocaine.)        Anesthesia Quick Evaluation

## 2019-09-15 NOTE — Op Note (Signed)
[facility] Patient Name: William Wood Procedure Date: 09/15/2019 8:47 AM MRN: [id number] Account #: [id number] Date of Birth: Dec 18, 1965 Admit Type: Outpatient Age: 54 Room: [facility] ENDO ROOM 1 Gender: Male Note Status: Finalized Procedure:             Upper GI endoscopy Indications:           Screening for Barrett's esophagus Providers:             [name] MD, MD Medicines:             Monitored Anesthesia Care Complications:         No immediate complications. Procedure:             Pre-Anesthesia Assessment:                        - Prior to the procedure, a History and Physical was                         performed, and patient medications, allergies and                         sensitivities were reviewed. The patient's tolerance                         of previous anesthesia was reviewed.                        - The risks and benefits of the procedure and the                         sedation options and risks were discussed with the                         patient. All questions were answered and informed                         consent was obtained.                        - ASA Grade Assessment: II - A patient with mild                         systemic disease.                        After obtaining informed consent, the endoscope was                         passed under direct vision. Throughout the procedure,                         the patient's blood pressure, pulse, and oxygen                         saturations were monitored continuously. The Endoscope                         was introduced through the mouth, and advanced to the  third part of duodenum. The upper GI endoscopy was                         accomplished with ease. The patient tolerated the                         procedure well. Findings:      The examined duodenum was normal.      The stomach was normal.      The cardia and gastric fundus were  normal on retroflexion.      The esophagus was normal. Impression:            - Normal examined duodenum.                        - Normal stomach.                        - Normal esophagus.                        - No specimens collected. Recommendation:        - Perform a colonoscopy today. Procedure Code(s):     --- Professional ---                        [phone], Esophagogastroduodenoscopy, flexible,                         transoral; diagnostic, including collection of                         specimen(s) by brushing or washing, when performed                         (separate procedure) Diagnosis Code(s):     --- Professional ---                        Z13.810, Encounter for screening for upper                         gastrointestinal disorder CPT copyright 2019 American Medical Association. All rights reserved. The codes documented in this report are preliminary and upon coder review may  be revised to meet current compliance requirements. Wyline Mood, MD Wyline Mood MD, MD 09/15/2019 9:42:52 AM This report has been signed electronically. Number of Addenda: 0 Note Initiated On: 09/15/2019 8:47 AM Estimated Blood Loss:  Estimated blood loss: none.      [facility]

## 2019-09-15 NOTE — Anesthesia Postprocedure Evaluation (Signed)
Anesthesia Post Note  Patient: William Wood  Procedure(s) Performed: COLONOSCOPY WITH PROPOFOL (N/A ) ESOPHAGOGASTRODUODENOSCOPY (EGD) WITH PROPOFOL (N/A )  Anesthesia Type: General     Last Vitals:  Vitals:   09/15/19 0851 09/15/19 1006  BP: 116/71 99/71  Pulse: 71   Resp: 18   Temp: (!) 36.1 C (!) 35.6 C  SpO2: (!) 10%     Last Pain:  Vitals:   09/15/19 1016  TempSrc:   PainSc: 0-No pain                 Corinda Gubler  Error in SpO2 entry: supposed to be 100%, not 10%. Patient stable.

## 2019-09-15 NOTE — Anesthesia Postprocedure Evaluation (Signed)
Anesthesia Post Note  Patient: William Wood  Procedure(s) Performed: COLONOSCOPY WITH PROPOFOL (N/A ) ESOPHAGOGASTRODUODENOSCOPY (EGD) WITH PROPOFOL (N/A )  Patient location during evaluation: Endoscopy Anesthesia Type: General Level of consciousness: awake and alert Pain management: pain level controlled Vital Signs Assessment: post-procedure vital signs reviewed and stable Respiratory status: spontaneous breathing, nonlabored ventilation, respiratory function stable and patient connected to nasal cannula oxygen Cardiovascular status: blood pressure returned to baseline and stable Postop Assessment: no apparent nausea or vomiting Anesthetic complications: no     Last Vitals:  Vitals:   09/15/19 0851 09/15/19 1006  BP: 116/71 99/71  Pulse: 71   Resp: 18   Temp: (!) 36.1 C (!) 35.6 C  SpO2: (!) 10%     Last Pain:  Vitals:   09/15/19 1016  TempSrc:   PainSc: 0-No pain                 Corinda Gubler

## 2019-09-15 NOTE — Transfer of Care (Signed)
Immediate Anesthesia Transfer of Care Note  Patient: William Wood  Procedure(s) Performed: COLONOSCOPY WITH PROPOFOL (N/A ) ESOPHAGOGASTRODUODENOSCOPY (EGD) WITH PROPOFOL (N/A )  Patient Location: PACU and Endoscopy Unit  Anesthesia Type:General  Level of Consciousness: drowsy and patient cooperative  Airway & Oxygen Therapy: Patient Spontanous Breathing  Post-op Assessment: Report given to RN and Post -op Vital signs reviewed and stable  Post vital signs: Reviewed and stable  Last Vitals:  Vitals Value Taken Time  BP 99/71 09/15/19 1006  Temp 35.6 C 09/15/19 1006  Pulse 59 09/15/19 1008  Resp 10 09/15/19 1008  SpO2 100 % 09/15/19 1008  Vitals shown include unvalidated device data.  Last Pain:  Vitals:   09/15/19 1006  TempSrc: Temporal  PainSc: Asleep         Complications: No apparent anesthesia complications

## 2019-09-15 NOTE — Addendum Note (Signed)
Addendum  created 09/15/19 1030 by Corinda Gubler, MD   Clinical Note Signed

## 2019-09-15 NOTE — H&P (Signed)
William Bellows, MD [address] [place], [place], [place], [address] [address], [place], [place], [place], [address] Phone: [phone]  Fax: [phone]  Primary Care Physician:  Langston Reusing, NP   Pre-Procedure History & Physical: HPI:  William Wood is a 54 y.o. male is here for an endoscopy and colonoscopy    Past Medical History:  Diagnosis Date  . Alcohol abuse 11/23/2010  . Allergic rhinitis 11/07/2010  . Anxiety   . Bradycardia   . Cocaine abuse ([facility]) 11/23/2010  . Congenital fusion of cervical spine    c5-c6  . DDD (degenerative disc disease), cervical    c6-c7  . Depression   . Diabetes mellitus without complication ([facility])   . GERD (gastroesophageal reflux disease)   . Tobacco abuse 11/23/2010    Past Surgical History:  Procedure Laterality Date  . CARDIAC CATHETERIZATION  2014    Prior to Admission medications   Medication Sig Start Date End Date Taking? Authorizing Provider  metFORMIN (GLUCOPHAGE) 1000 MG tablet Take 0.5 tablets (500 mg total) by mouth daily with breakfast. 09/09/19  Yes Iloabachie, Chioma E, NP  pantoprazole (PROTONIX) 40 MG tablet Take 1 tablet (40 mg total) by mouth daily. 08/12/19  Yes Iloabachie, Chioma E, NP    Allergies as of 09/02/2019  . (No Known Allergies)    Family History  Problem Relation Age of Onset  . Stroke Paternal Aunt   . Congestive Heart Failure Paternal Grandmother        '  . Diabetes Other        maternal side of family  . Cancer Other        grandfather  . Alcohol abuse Brother   . Alcohol abuse Brother   . Alcohol abuse Brother     Social History   Socioeconomic History  . Marital status: Married    Spouse name: Not on file  . Number of children: Not on file  . Years of education: Not on file  . Highest education level: Not on file  Occupational History  . Not on file  Tobacco Use  . Smoking status: Former Smoker    Packs/day: 0.50    Types: Cigarettes    Quit date: 06/02/2019      Years since quitting: 0.2  . Smokeless tobacco: Never Used  Substance and Sexual Activity  . Alcohol use: Not Currently    Comment: in rehab  . Drug use: Not Currently    Types: "Crack" cocaine, Cocaine, Marijuana    Comment: 6 months ago  . Sexual activity: Yes    Birth control/protection: None  Other Topics Concern  . 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  Was imprisoned in 1995 for 8.5 years for robbery with a deadly weapon.  Mother shot and killed his father while pregnant with him.  Mother was shot and killed when he was 77.  Some trade school while imprisoned.  Live in [place] with his uncle.  He has 2 sisters and 6 brothers.  Was brought up by his grandparents.  Two sons and a daughter that do not live with him.    Social Determinants of Health   Financial Resource Strain:   . Difficulty of Paying Living Expenses:   Food Insecurity:   . Worried About Charity fundraiser in the Last Year:   . [place] in the Last Year:  Transportation Needs:   . Freight forwarder (Medical):   Marland Kitchen Lack of Transportation (Non-Medical):   Physical Activity:   . Days of Exercise per Week:   . Minutes of Exercise per Session:   Stress:   . Feeling of Stress :   Social Connections:   . Frequency of Communication with Friends and Family:   . Frequency of Social Gatherings with Friends and Family:   . Attends Religious Services:   . Active Member of Clubs or Organizations:   . Attends Banker Meetings:   Marland Kitchen Marital Status:   Intimate Partner Violence:   . Fear of Current or Ex-Partner:   . Emotionally Abused:   Marland Kitchen Physically Abused:   . Sexually Abused:     Review of Systems: See HPI, otherwise negative ROS  Physical Exam: BP 116/71   Pulse 71   Temp (!) 97 F (36.1 C) (Temporal)   Resp 18   Ht 5' 5.5" (1.664 m)   Wt 69.5 kg   SpO2 (!) 10%   BMI 25.12 kg/m  General:   Alert,   pleasant and cooperative in NAD Head:  Normocephalic and atraumatic. Neck:  Supple; no masses or thyromegaly. Lungs:  Clear throughout to auscultation, normal respiratory effort.    Heart:  +S1, +S2, Regular rate and rhythm, No edema. Abdomen:  Soft, nontender and nondistended. Normal bowel sounds, without guarding, and without rebound.   Neurologic:  Alert and  oriented x4;  grossly normal neurologically.  Impression/Plan: William Wood is here for an endoscopy and colonoscopy  to be performed for  evaluation of barrettes esophagus screening and colon cancer screening     Risks, benefits, limitations, and alternatives regarding endoscopy have been reviewed with the patient.  Questions have been answered.  All parties agreeable.   Wyline Mood, MD  09/15/2019, 9:23 AM

## 2019-09-15 NOTE — Op Note (Signed)
[facility] Gastroenterology Patient Name: William Wood Procedure Date: 09/15/2019 8:47 AM MRN: [id number] Account #: [id number] Date of Birth: 1966/01/22 Admit Type: Outpatient Age: 54 Room: [facility] ENDO ROOM 1 Gender: Male Note Status: Finalized Procedure:             Colonoscopy Indications:           Screening for colorectal malignant neoplasm Providers:             Wyline Mood MD, MD Medicines:             Monitored Anesthesia Care Complications:         No immediate complications. Procedure:             Pre-Anesthesia Assessment:                        - Prior to the procedure, a History and Physical was                         performed, and patient medications, allergies and                         sensitivities were reviewed. The patient's tolerance                         of previous anesthesia was reviewed.                        - The risks and benefits of the procedure and the                         sedation options and risks were discussed with the                         patient. All questions were answered and informed                         consent was obtained.                        - ASA Grade Assessment: II - A patient with mild                         systemic disease.                        After obtaining informed consent, the colonoscope was                         passed under direct vision. Throughout the procedure,                         the patient's blood pressure, pulse, and oxygen                         saturations were monitored continuously. The                         Colonoscope was introduced through the anus and  advanced to the the cecum, identified by the                         appendiceal orifice. The colonoscopy was performed                         with ease. The patient tolerated the procedure well.                         The quality of the bowel preparation was excellent. Findings:      The  perianal and digital rectal examinations were normal.      The entire examined colon appeared normal on direct and retroflexion       views. Impression:            - The entire examined colon is normal on direct and                         retroflexion views.                        - No specimens collected. Recommendation:        - Discharge patient to home (with escort).                        - Resume previous diet.                        - Continue present medications.                        - Repeat colonoscopy in 10 years for screening                         purposes. Procedure Code(s):     --- Professional ---                        [phone], Colonoscopy, flexible; diagnostic, including                         collection of specimen(s) by brushing or washing, when                         performed (separate procedure) Diagnosis Code(s):     --- Professional ---                        Z12.11, Encounter for screening for malignant neoplasm                         of colon CPT copyright 2019 American Medical Association. All rights reserved. The codes documented in this report are preliminary and upon coder review may  be revised to meet current compliance requirements. [name], MD [name] MD, MD 09/15/2019 10:09:26 AM This report has been signed electronically. Number of Addenda: 0 Note Initiated On: 09/15/2019 8:47 AM Scope Withdrawal Time: 0 hours 13 minutes 35 seconds  Total Procedure Duration: 0 hours 15 minutes 21 seconds  Estimated Blood Loss:  Estimated blood loss: none.      [facility]

## 2019-09-16 ENCOUNTER — Ambulatory Visit (INDEPENDENT_AMBULATORY_CARE_PROVIDER_SITE_OTHER): Payer: Self-pay | Admitting: Cardiovascular Disease

## 2019-09-16 ENCOUNTER — Encounter: Payer: Self-pay | Admitting: Cardiovascular Disease

## 2019-09-16 VITALS — BP 120/66 | HR 43 | Ht 65.5 in | Wt 153.2 lb

## 2019-09-16 DIAGNOSIS — R001 Bradycardia, unspecified: Secondary | ICD-10-CM

## 2019-09-16 NOTE — Patient Instructions (Signed)

## 2019-09-16 NOTE — Progress Notes (Signed)
Cardiology Office Note  Date:  09/16/2019   ID:  William Wood, DOB Mar 18, 1966, MRN [id number]  PCP:  Rolm Gala, NP   Chief Complaint  Patient presents with  . New Patient (Initial Visit)    Referred by PCP for Bradycardia. Meds reviewed verbally with patient.     HPI:  [name] is a 54 year old gentleman with past medical history of Substance abuse/alcohol/cocaine Smoker GERD Referred by Eulogio Bear for bradycardia  Heart rate when seen by primary care August 12, 2019 was 56 Blood pressure 122/76 on that visit Scheduled for EKG tomorrow  Reports he is asymptomatic, denies any near-syncope or syncope Good exercise tolerance Would like to go running, was told by primary care he should not run or get his heart rate too high for unclear reasons  Lab work reviewed Hemoglobin A1c 6.3  Review of prior notes including emergency room records indicates long history of bradycardia September 20, 2015 heart rate 54 September 29, 2015 heart rate 50  Seen in the emergency room September 01, 2017 with suicidal thoughts  Quit smoking dec 2020  Echo 2014: normal Ef 60%  EKG personally reviewed by myself on todays visit Shows sinus bradycardia rate 43 bpm nonspecific T wave abnormality  Heart rate on exertion in the office today as detailed below: Heart rate supine 43 Heart rate sitting at rest 48 Heart rate standing 59 Heart rate with walking around the office 80s initially, trending down into the 60s Blood pressure after walking heart rate 60s 115/70  PMH:   has a past medical history of Alcohol abuse (11/23/2010), Allergic rhinitis (11/07/2010), Anxiety, Bradycardia, Cocaine abuse ([facility]) (11/23/2010), Congenital fusion of cervical spine, DDD (degenerative disc disease), cervical, Depression, Diabetes mellitus without complication ([facility]), GERD (gastroesophageal reflux disease), and Tobacco abuse (11/23/2010).  PSH:    Past Surgical History:  Procedure Laterality Date  .  CARDIAC CATHETERIZATION  2014    Current Outpatient Medications  Medication Sig Dispense Refill  . metFORMIN (GLUCOPHAGE) 1000 MG tablet Take 0.5 tablets (500 mg total) by mouth daily with breakfast. 30 tablet 1  . pantoprazole (PROTONIX) 40 MG tablet Take 1 tablet (40 mg total) by mouth daily. 30 tablet 3   No current facility-administered medications for this visit.     Allergies:   Patient has no known allergies.   Social History:  The patient  reports that he quit smoking about 3 months ago. His smoking use included cigarettes. He smoked 0.50 packs per day. He has never used smokeless tobacco. He reports previous alcohol use. He reports previous drug use. Drugs: "Crack" cocaine, Cocaine, and Marijuana.   Family History:   family history includes Alcohol abuse in his brother, brother, and brother; Cancer in an other family member; Congestive Heart Failure in his paternal grandmother; Diabetes in an other family member; Stroke in his paternal aunt.    Review of Systems: Review of Systems  Constitutional: Negative.   HENT: Negative.   Respiratory: Negative.   Cardiovascular: Negative.   Gastrointestinal: Negative.   Musculoskeletal: Negative.   Neurological: Negative.   Psychiatric/Behavioral: Negative.   All other systems reviewed and are negative.    PHYSICAL EXAM: VS:  BP 120/66 (BP Location: Right Arm, Patient Position: Sitting, Cuff Size: Normal)   Pulse (!) 43   Ht 5' 5.5" (1.664 m)   Wt 153 lb 4 oz (69.5 kg)   SpO2 97%   BMI 25.11 kg/m  , BMI Body mass index is 25.11 kg/m. GEN: Well  nourished, well developed, in no acute distress HEENT: normal Neck: no JVD, carotid bruits, or masses Cardiac: Regular, bradycardic; no murmurs, rubs, or gallops,no edema  Respiratory:  clear to auscultation bilaterally, normal work of breathing GI: soft, nontender, nondistended, + BS MS: no deformity or atrophy Skin: warm and dry, no rash Neuro:  Strength and sensation are  intact Psych: euthymic mood, full affect   Recent Labs: 04/29/2019: ALT 14; BUN 12; Creatinine, Ser 1.02; Hemoglobin 14.4; Platelets 212; Potassium 4.4; Sodium 142; TSH 1.740    Lipid Panel Lab Results  Component Value Date   CHOL 177 04/29/2019   HDL 58 04/29/2019   [place] 95 04/29/2019   TRIG 136 04/29/2019      Wt Readings from Last 3 Encounters:  09/16/19 153 lb 4 oz (69.5 kg)  09/15/19 153 lb 4.6 oz (69.5 kg)  09/09/19 154 lb 11.2 oz (70.2 kg)     ASSESSMENT AND PLAN:  Problem List Items Addressed This Visit    Bradycardia - Primary   Relevant Orders   EKG 12-Lead     Sinus bradycardia Asymptomatic Good chronotropic competence on exertion Blood pressure stable No indication for further work-up, not a candidate for pacemaker  Former smoker Continued cessation recommended  Borderline diabetes He has changed his diet, increased his exercise On Metformin Has repeat lab work pending with primary care  History of substance abuse Recommended avoiding cocaine, alcohol, other substances  Disposition:   F/U  12 months   Total encounter time more than 25 minutes  Greater than 50% was spent in counseling and coordination of care with the patient    Signed, Esmond Plants, M.D., Ph.D. [place], [place]

## 2019-09-17 ENCOUNTER — Ambulatory Visit: Payer: Self-pay

## 2019-09-19 ENCOUNTER — Telehealth: Payer: Self-pay | Admitting: Pharmacy Technician

## 2019-09-19 NOTE — Telephone Encounter (Signed)
Received updated proof of income.  Patient eligible to receive medication assistance at [facility] until time for re-certification in 9359, and as long as eligibility requirements continue to be met.  [facility]

## 2019-09-25 ENCOUNTER — Encounter: Payer: Self-pay | Admitting: Dietician

## 2019-09-25 ENCOUNTER — Encounter: Payer: Self-pay | Attending: Gerontology | Admitting: Dietician

## 2019-09-25 ENCOUNTER — Other Ambulatory Visit: Payer: Self-pay

## 2019-09-25 VITALS — Ht 65.5 in | Wt 149.5 lb

## 2019-09-25 DIAGNOSIS — K219 Gastro-esophageal reflux disease without esophagitis: Secondary | ICD-10-CM | POA: Insufficient documentation

## 2019-09-25 DIAGNOSIS — R7303 Prediabetes: Secondary | ICD-10-CM | POA: Insufficient documentation

## 2019-09-25 DIAGNOSIS — Z713 Dietary counseling and surveillance: Secondary | ICD-10-CM | POA: Insufficient documentation

## 2019-09-25 NOTE — Patient Instructions (Signed)
   Great job reducing sugar in foods and drinks, keep up the awesome work!  Continue to get some exercise 3 or more days each week.   Continue to control portions of starchy foods, and include a protein food and a vegetable or fruit as often as possible.

## 2019-09-25 NOTE — Progress Notes (Signed)
Medical Nutrition Therapy: Visit start time: 1045  end time: 1210  Assessment:  Diagnosis: pre-diabetes, GERD Past medical history: drug addiction Psychosocial issues/ stress concerns: none; currently in residential treatment program  Preferred learning method:  . Auditory . Visual . Hands-on   Current weight: 149.5lbs Height: 5'5.5" Medications, supplements: Metformin, Pantoprazole, Centrum Complete Men's 50+ multivitamin  Progress and evaluation:   Patient reports his weight was over 155lbs when he quit smoking and began eating more and higher carb diet, ie 2-sandwich meals, large portions of potatoes, sweets, and low vegetable intake.   He has made some changes to reduce carb portions, and has lost about 6 lbs.    He is incorporating walking and running for exercise, and states he is accustomed to staying occupied with home and work projects. He was out of work for about 6 months for rehab.   Meals are provided at current residence, but patient has some concerns over safety of food served, so he often eats his own foods; only has a microwave oven for food prep. No refrigerator but can get one.  Physical activity: running 20 minutes 3x a week  Dietary Intake:  Usual eating pattern includes 3 meals and 1-2 snacks per day. Dining out frequency: 0 meals per week.  Breakfast: oatmeal or raisin bran; eggs and grits Snack: none Lunch: crackers Snack: peanuts Supper: some cooked meals ie chicken and veg at residence, but often simple microwaveable meals ie Healthy choice soup with added veg, occ with smoked oysters Snack: nuts or peanut butter crackers Beverages: water, 8oz juice in am  Nutrition Care Education: Topics covered:  Basic nutrition: basic food groups, appropriate nutrient balance, appropriate meal and snack schedule, general nutrition guidelines    Pre-Diabetes: appropriate meal and snack schedule, appropriate carb intake and balance, healthy carb choices, role of  fiber, protein, fat GERD: eating smaller more frequent meals/ snacks vs larger, infrequent meals; avoiding highly acidic foods; choosing low fat foods and limiting added fats; avoiding spicy foods  Nutritional Diagnosis:  [place]-2.2 Altered nutrition-related laboratory As related to pre-diabetes.  As evidenced by recent HbA1C of 6.3%. [employer]-1.4 Altered GI function As related to GERD.  As evidenced by patient report of GI symptoms.  Intervention:   Instruction and discussion as noted above.  Patient has been making positive diet changes on his own and has lost weight, now close to his normal weight of 140-150lbs.   He is motivated to continue with his current healthy choices and will expand variety with meal and snack options discussed.  Established nutrition goals with input from patient.  No follow-up scheduled at this time; patient will schedule later if needed.  Education Materials given:  . General diet guidelines for Diabetes (prevention) . Plate Planner with food lists . sample menus . Snacking handout . Goals/ instructions   Learner/ who was taught:  . Patient   Level of understanding: Marland Kitchen Verbalizes/ demonstrates competency   Demonstrated degree of understanding via:   Teach back Learning barriers: . None  Willingness to learn/ readiness for change: . Eager, change in progress   Monitoring and Evaluation:  Dietary intake, exercise, BG control, GI symptoms, and body weight      follow up: prn

## 2019-11-04 ENCOUNTER — Other Ambulatory Visit: Payer: Self-pay

## 2019-11-05 ENCOUNTER — Other Ambulatory Visit: Payer: Self-pay

## 2019-11-05 DIAGNOSIS — R7303 Prediabetes: Secondary | ICD-10-CM

## 2019-11-06 LAB — HEMOGLOBIN A1C
Est. average glucose Bld gHb Est-mCnc: 128 mg/dL
Hgb A1c MFr Bld: 6.1 % — ABNORMAL HIGH (ref 4.8–5.6)

## 2019-11-11 ENCOUNTER — Ambulatory Visit: Payer: Self-pay | Admitting: Gerontology

## 2019-11-12 ENCOUNTER — Ambulatory Visit: Payer: Self-pay | Admitting: Gerontology

## 2019-11-12 ENCOUNTER — Other Ambulatory Visit: Payer: Self-pay

## 2019-11-12 VITALS — BP 116/74 | HR 45 | Ht 65.0 in | Wt 149.0 lb

## 2019-11-12 DIAGNOSIS — Z8719 Personal history of other diseases of the digestive system: Secondary | ICD-10-CM

## 2019-11-12 DIAGNOSIS — Z0000 Encounter for general adult medical examination without abnormal findings: Secondary | ICD-10-CM

## 2019-11-12 DIAGNOSIS — R7303 Prediabetes: Secondary | ICD-10-CM

## 2019-11-12 MED ORDER — PANTOPRAZOLE SODIUM 40 MG PO TBEC: 40.0000 mg | DELAYED_RELEASE_TABLET | Freq: Every day | ORAL | 3 refills | Status: DC

## 2019-11-12 MED ORDER — METFORMIN HCL 1000 MG PO TABS: 500.0000 mg | ORAL_TABLET | Freq: Every day | ORAL | 3 refills | Status: DC
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
## 2019-11-12 NOTE — Patient Instructions (Signed)

## 2019-12-15 ENCOUNTER — Telehealth: Payer: Self-pay | Admitting: Gerontology

## 2019-12-15 NOTE — Telephone Encounter (Signed)
Unable to complete call 06/15

## 2019-12-16 ENCOUNTER — Telehealth: Payer: Self-pay | Admitting: Gerontology

## 2019-12-16 NOTE — Telephone Encounter (Signed)
Attempted to r/s lab appointment in August, but call could not be completed.

## 2019-12-31 ENCOUNTER — Telehealth: Payer: Self-pay | Admitting: Gerontology

## 2019-12-31 NOTE — Telephone Encounter (Signed)
Pt has been called numerous times regarding 8/12 lab appt to try and r/s. Call could not be completed. Will be cancelling both lab and fu appt.

## 2020-02-11 ENCOUNTER — Other Ambulatory Visit: Payer: Self-pay

## 2020-02-17 ENCOUNTER — Ambulatory Visit: Payer: Self-pay | Admitting: Gerontology

## 2020-02-19 ENCOUNTER — Encounter (HOSPITAL_COMMUNITY): Payer: Self-pay

## 2020-02-19 ENCOUNTER — Ambulatory Visit (HOSPITAL_COMMUNITY)
Admission: EM | Admit: 2020-02-19 | Discharge: 2020-02-19 | Disposition: A | Discharge status: Home / Self Care | Payer: Self-pay | Attending: Family Medicine | Admitting: Family Medicine

## 2020-02-19 DIAGNOSIS — S81012A Laceration without foreign body, left knee, initial encounter: Secondary | ICD-10-CM

## 2020-02-19 NOTE — Discharge Instructions (Signed)

## 2020-02-19 NOTE — ED Triage Notes (Signed)
Pt states that he accidentally cut his left knee with gas-powered hedge trimmers approx 30 min PTA. Approx 2cm L-shaped laceration noted to lateral/anterior left knee; edges well-approximate, moderate bleeding noted; no obvious signs of foreign body; able to flex/extend knee; neurovascular intact to left foot with good pulses/sensation. DSG remains intact. Last tetanus unknown.

## 2020-02-19 NOTE — ED Notes (Signed)
Eval pt on arrival. Pt states that he accidentally cut his left knee with gas-powered hedge trimmers approx 30 min PTA. Approx 2cm L-shaped laceration noted to lateral/anterior left knee; edges well-approximate, moderate bleeding noted; no obvious signs of foreign body; able to flex/extend knee; neurovascular intact to left foot with good pulses/sensation. Dressing applied to control bleeding and pt returned to waiting room.
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
## 2020-04-08 ENCOUNTER — Encounter: Payer: Self-pay | Admitting: *Deleted

## 2020-04-08 NOTE — Congregational Nurse Program (Signed)
  Dept: [phone]   Congregational Nurse Program Note  Date of Encounter: 04/08/2020  Past Medical History: Past Medical History:  Diagnosis Date  . Alcohol abuse 11/23/2010  . Allergic rhinitis 11/07/2010  . Anxiety   . Bradycardia   . Cocaine abuse (HCC) 11/23/2010  . Congenital fusion of cervical spine    c5-c6  . DDD (degenerative disc disease), cervical    c6-c7  . Depression   . Diabetes mellitus without complication (HCC)   . GERD (gastroesophageal reflux disease)   . Tobacco abuse 11/23/2010    Encounter Details:  CNP Questionnaire - 04/08/20 1033      Questionnaire   Do you give verbal consent to treat you today? Yes    Visit Setting Other    Location Patient Served At [facility]    Patient Status Homeless   staying at [facility] Provider No    Insurance Uninsured (Includes Orange Card/[facility] [place])    Intervention Educate;Support    Housing/Utilities No permanent housing    Medication Have medication insecurities    Referrals PCP - other provider   Lavinia Sharps NP   Screening Referrals Diabetic (Hgb A1C, Vision, Podiatry)          Pt came to [facility] and asked writer to check his blood sugar as he has not been taking his metformin. Checked vitals and cbg. CBG 137 after pt ate a biscuit and hash browns. Client asked if he should start metformin again and does not currently have a provider that he sees since he started staying in this area. He currently has a room at the [facility] and is preparing to start school. Educated on food choices for diabetes. Explained he would need to see a provider for evaluation and blood work related to medication management. Referred to Lavinia Sharps NP. William Prude W RN CN [phone]

## 2020-04-20 ENCOUNTER — Encounter: Payer: Self-pay | Admitting: Critical Care Medicine

## 2020-04-20 NOTE — Progress Notes (Signed)
Patient ID: William Wood, male   DOB: [birth year], 54 y.o.   MRN: [id number] This is a 54 year old male seen in the [place] [facility] clinic today wishing to have his ears checked he was cleaning wax out of the left ear and developed increased discomfort in the left ear   On exam the left ear shows mild excoriation minimal wax but no evidence of external or internal otitis  Right ear is unremarkable  No evidence of acute pathology in the ear the patient's told not to use Q-tips to digging material out of the ear in the future

## 2020-05-11 ENCOUNTER — Encounter: Payer: Self-pay | Admitting: Critical Care Medicine

## 2020-05-11 NOTE — Progress Notes (Signed)
This a pleasant 54 year old male who comes to the [place] shelter clinic complaints of left foot plantar surface blister also prior history of diabetes note previous hemoglobin A1c was 6.1 in May I did check a CBG today it was 100 the dorsum of his foot showed a 2 x 3 cm blister in the mid plantar aspect likely from trauma and pressure injury he has already lanced this and it is drained it does not appear purulent  Plan is to apply triple antibiotic ointment with gauze and Kerlix wrap he is to change this daily for the next 3 to 4-day supplies were given no systemic antibiotics indicated I will attempt again orthotic for this patient insert for his boots and I also gave this patient thicker more supportive socks to use in his boots

## 2020-05-11 NOTE — Progress Notes (Signed)
COVID-19 Vaccine Information can be found at: [email or web address] For questions related to vaccine distribution or appointments, please email [email or web address] or call [phone].    

## 2020-05-23 ENCOUNTER — Other Ambulatory Visit: Payer: Self-pay | Admitting: Pharmacist

## 2020-06-29 ENCOUNTER — Encounter: Payer: Self-pay | Admitting: Physician Assistant

## 2020-07-06 NOTE — Progress Notes (Signed)
CBG 109  Still having reflux regularly.  Has blisters on his feet, they come up and down. His feet are very dry and cracking.  Has had a low HR for a long time, no sx from this, no presyncope or syncope.  Checked his feet in his shoes, they are 9 1/2 and are too small. Needs a 10 at least.   Given: omeprazole, foot cream  Theodore Demark, PA-C

## 2020-08-07 ENCOUNTER — Other Ambulatory Visit: Payer: Self-pay

## 2020-08-07 ENCOUNTER — Ambulatory Visit (HOSPITAL_COMMUNITY)
Admission: EM | Admit: 2020-08-07 | Discharge: 2020-08-07 | Disposition: A | Discharge status: Home / Self Care | Payer: HRSA Program | Attending: Emergency Medicine | Admitting: Emergency Medicine

## 2020-08-07 ENCOUNTER — Emergency Department (HOSPITAL_COMMUNITY): Admission: EM | Admit: 2020-08-07 | Discharge: 2020-08-07 | Discharge status: Absent without leave | Payer: Self-pay

## 2020-08-07 DIAGNOSIS — Z20822 Contact with and (suspected) exposure to covid-19: Secondary | ICD-10-CM | POA: Diagnosis not present

## 2020-08-07 NOTE — ED Triage Notes (Signed)
Pt in requesting covid test so that he can get into shelter  Denies any uri sxs, N/V/D

## 2020-08-08 LAB — SARS CORONAVIRUS 2 (TAT 6-24 HRS): SARS Coronavirus 2: NEGATIVE

## 2020-08-09 ENCOUNTER — Telehealth: Payer: Self-pay | Admitting: Pharmacy Technician

## 2020-08-09 ENCOUNTER — Telehealth: Payer: Self-pay

## 2020-08-09 NOTE — Telephone Encounter (Signed)
Patient no longer at [facility].  Failed to provide poi for current residence.  Patient made inactive.  Medication assistance will resume once poi is provided.  Jaylie Neaves J. Lesley Atkin Care Manager [facility] 

## 2020-08-09 NOTE — Telephone Encounter (Signed)
scheduled fu for 2/22 at 12:00 per message from staff

## 2020-08-23 ENCOUNTER — Telehealth: Payer: Self-pay

## 2020-08-23 ENCOUNTER — Ambulatory Visit: Payer: Self-pay | Admitting: Gerontology

## 2020-08-23 NOTE — Telephone Encounter (Signed)
LVM to try and reschedule f/u

## 2020-08-24 ENCOUNTER — Encounter: Payer: Self-pay | Admitting: *Deleted

## 2020-08-24 NOTE — Congregational Nurse Program (Signed)
  Dept: [phone]   Congregational Nurse Program Note  Date of Encounter: 08/24/2020  Past Medical History: Past Medical History:  Diagnosis Date  . Alcohol abuse 11/23/2010  . Allergic rhinitis 11/07/2010  . Anxiety   . Bradycardia   . Cocaine abuse (HCC) 11/23/2010  . Congenital fusion of cervical spine    c5-c6  . DDD (degenerative disc disease), cervical    c6-c7  . Depression   . Diabetes mellitus without complication (HCC)   . GERD (gastroesophageal reflux disease)   . Tobacco abuse 11/23/2010    Encounter Details:  CNP Questionnaire - 08/24/20 1457      Questionnaire   Do you give verbal consent to treat you today? Yes    Visit Setting Church or Organization    Location Patient Served At [facility]    Patient Status Homeless   [facility]   Medical Provider No    Insurance Uninsured (Includes [employer] Card/[employer] [place])    Intervention Support    Housing/Utilities No permanent housing          Client came to office requesting a pair of socks. Gave as requested. He reports he is in the process of going to [facility] and has completed an intake form. No other requests at this time. Nira Conn. RN CN [phone]

## 2020-08-25 ENCOUNTER — Telehealth: Payer: Self-pay | Admitting: Gerontology

## 2020-08-25 NOTE — Telephone Encounter (Signed)
Tried calling pt to reschedule no show from 2/22 and LVM.   MD 08/25/20 @ 3:25

## 2020-08-26 ENCOUNTER — Telehealth: Payer: Self-pay | Admitting: *Deleted

## 2020-08-26 ENCOUNTER — Encounter: Payer: Self-pay | Admitting: *Deleted

## 2020-08-26 NOTE — Congregational Nurse Program (Signed)
  Dept: [phone]   Congregational Nurse Program Note  Date of Encounter: 08/26/2020  Past Medical History: Past Medical History:  Diagnosis Date  . Alcohol abuse 11/23/2010  . Allergic rhinitis 11/07/2010  . Anxiety   . Bradycardia   . Cocaine abuse (HCC) 11/23/2010  . Congenital fusion of cervical spine    c5-c6  . DDD (degenerative disc disease), cervical    c6-c7  . Depression   . Diabetes mellitus without complication (HCC)   . GERD (gastroesophageal reflux disease)   . Tobacco abuse 11/23/2010    Encounter Details:  CNP Questionnaire - 08/26/20 1301      Questionnaire   Do you give verbal consent to treat you today? Yes    Visit Setting Church or Organization    Location Patient Served At [facility]    Patient Status Homeless    Medical Provider No    Insurance Uninsured (Includes [employer] Card/[employer] [place])    Intervention Support;Refer    Housing/Utilities No permanent housing    Transportation Provided transportation assistance ([facility] transp,bus pass, taxi voucher, etc.);Need transportation assistance    Referrals Other   [facility]         Client came in requesting help getting in [facility]. Worked with [facility] and found out what is needed for intake. Therapist, occupational and received approval to provide transportation to [facility] with [employer]. Client left IRC after 12:00 for [facility]. Telesia Ates W RN CN [phone]

## 2020-08-26 NOTE — Telephone Encounter (Signed)
Called [employer] and scheduled ride for client to [facility].

## 2020-08-26 NOTE — Telephone Encounter (Signed)
Called [facility] to inquire about admission process and bed availability.

## 2020-08-26 NOTE — Congregational Nurse Program (Signed)
  Dept: [phone]   Congregational Nurse Program Note  Date of Encounter: 08/26/2020  Past Medical History: Past Medical History:  Diagnosis Date  . Alcohol abuse 11/23/2010  . Allergic rhinitis 11/07/2010  . Anxiety   . Bradycardia   . Cocaine abuse (HCC) 11/23/2010  . Congenital fusion of cervical spine    c5-c6  . DDD (degenerative disc disease), cervical    c6-c7  . Depression   . Diabetes mellitus without complication (HCC)   . GERD (gastroesophageal reflux disease)   . Tobacco abuse 11/23/2010    Encounter Details:  CNP Questionnaire - 08/26/20 1253      Questionnaire   Do you give verbal consent to treat you today? Yes    Visit Setting Church or Organization    Location Patient Served At [facility]    Patient Status Homeless    Medical Provider No    Insurance Uninsured (Includes [employer] Card/[employer] [place])    Intervention Support    Housing/Utilities No permanent housing          Client came to office requesting a pair of socks. Gave as requested. He reports he is talking with a SW about [facility]. No other requests at this time.

## 2020-09-01 ENCOUNTER — Telehealth: Payer: Self-pay | Admitting: Gerontology

## 2020-09-01 NOTE — Telephone Encounter (Signed)
LVM to tell pt about rescheduled appt

## 2020-09-20 ENCOUNTER — Ambulatory Visit: Payer: Self-pay | Admitting: Gerontology

## 2020-09-22 ENCOUNTER — Ambulatory Visit: Payer: Self-pay | Admitting: Gerontology

## 2022-02-26 ENCOUNTER — Emergency Department (HOSPITAL_COMMUNITY)
Admission: EM | Admit: 2022-02-26 | Discharge: 2022-02-27 | Disposition: A | Discharge status: Home / Self Care | Payer: Managed Care, Other (non HMO) | Attending: Student | Admitting: Student

## 2022-02-26 ENCOUNTER — Emergency Department (HOSPITAL_COMMUNITY): Payer: Managed Care, Other (non HMO)

## 2022-02-26 ENCOUNTER — Encounter (HOSPITAL_COMMUNITY): Payer: Self-pay

## 2022-02-26 ENCOUNTER — Other Ambulatory Visit: Payer: Self-pay

## 2022-02-26 DIAGNOSIS — R079 Chest pain, unspecified: Secondary | ICD-10-CM | POA: Insufficient documentation

## 2022-02-26 DIAGNOSIS — R519 Headache, unspecified: Secondary | ICD-10-CM | POA: Diagnosis present

## 2022-02-26 DIAGNOSIS — D72829 Elevated white blood cell count, unspecified: Secondary | ICD-10-CM | POA: Insufficient documentation

## 2022-02-26 DIAGNOSIS — E876 Hypokalemia: Secondary | ICD-10-CM | POA: Insufficient documentation

## 2022-02-26 LAB — CBC
HCT: 39.6 % (ref 39.0–52.0)
Hemoglobin: 13.5 g/dL (ref 13.0–17.0)
MCH: 29.1 pg (ref 26.0–34.0)
MCHC: 34.1 g/dL (ref 30.0–36.0)
MCV: 85.3 fL (ref 80.0–100.0)
Platelets: 239 10*3/uL (ref 150–400)
RBC: 4.64 MIL/uL (ref 4.22–5.81)
RDW: 14.7 % (ref 11.5–15.5)
WBC: 11.9 10*3/uL — ABNORMAL HIGH (ref 4.0–10.5)
nRBC: 0 % (ref 0.0–0.2)

## 2022-02-26 LAB — TROPONIN I (HIGH SENSITIVITY): Troponin I (High Sensitivity): 12 ng/L (ref <18)

## 2022-02-26 LAB — BASIC METABOLIC PANEL
Anion gap: 10 (ref 5–15)
BUN: 12 mg/dL (ref 6–20)
CO2: 24 mmol/L (ref 22–32)
Calcium: 9.4 mg/dL (ref 8.9–10.3)
Chloride: 103 mmol/L (ref 98–111)
Creatinine, Ser: 1.08 mg/dL (ref 0.61–1.24)
GFR, Estimated: >60 mL/min (ref >60)
Glucose, Bld: 134 mg/dL — ABNORMAL HIGH (ref 70–99)
Potassium: 3.2 mmol/L — ABNORMAL LOW (ref 3.5–5.1)
Sodium: 137 mmol/L (ref 135–145)

## 2022-02-26 NOTE — ED Triage Notes (Signed)
Patient complains of pain that radiates from his head to his left shoulder for 2-3 months.  Patient was transported by [facility] home. When he arrived at home he stated he was having chest pain. However when EMS arrived he stated he was no longer having chest pain but having head pain in to his shoulder.

## 2022-02-26 NOTE — ED Notes (Signed)
After speaking with the patient he states he is still having chest pain.

## 2022-02-26 NOTE — ED Provider Triage Note (Signed)
Emergency Medicine Provider Triage Evaluation Note  DEMETRIC DUNNAWAY , a 56 y.o. male  was evaluated in triage.  Pt complains of CP and headache.  States that he has been using cocaine, smoking, for several days.  He has not been eating and drinking.  He has had some diarrhea.  States that he is "coming down" from the drugs he was using.  Denies fever or cough.  Denies alcohol use.  Review of Systems  Positive: Headache, chest pain Negative: Vomiting  Physical Exam  BP (!) 134/104 (BP Location: Left Arm)   Pulse 92   Temp 98.4 F (36.9 C) (Oral)   Resp 15   SpO2 96%  Gen:   Awake, no distress   Resp:  Normal effort  MSK:   Moves extremities without difficulty  Other:  Heart regular rate and rhythm, no murmurs; lungs clear to auscultation bilaterally  Medical Decision Making  Medically screening exam initiated at 11:15 PM.  Appropriate orders placed.  CHANZE TEAGLE was informed that the remainder of the evaluation will be completed by another provider, this initial triage assessment does not replace that evaluation, and the importance of remaining in the ED until their evaluation is complete.     Renne Crigler, PA-C 02/26/22 2316

## 2022-02-27 LAB — URINALYSIS, ROUTINE W REFLEX MICROSCOPIC
Bilirubin Urine: NEGATIVE
Glucose, UA: NEGATIVE mg/dL
Hgb urine dipstick: NEGATIVE
Ketones, ur: 5 mg/dL — AB
Leukocytes,Ua: NEGATIVE
Nitrite: NEGATIVE
Protein, ur: 100 mg/dL — AB
Specific Gravity, Urine: 1.01 (ref 1.005–1.030)
pH: 5 (ref 5.0–8.0)

## 2022-02-27 MED ORDER — PANTOPRAZOLE SODIUM 40 MG PO TBEC: 40.0000 mg | DELAYED_RELEASE_TABLET | Freq: Every day | ORAL | 0 refills | Status: DC

## 2022-02-27 MED ORDER — POTASSIUM CHLORIDE CRYS ER 20 MEQ PO TBCR
40.0000 meq | EXTENDED_RELEASE_TABLET | Freq: Once | ORAL | Status: AC
  Administered 2022-02-27: 40 meq via ORAL
  Filled 2022-02-27: qty 2

## 2022-02-27 NOTE — ED Provider Notes (Cosign Needed Addendum)
[facility]-EMERGENCY DEPT Provider Note   CSN: [id number] Arrival date & time: 02/26/22  2149     History  Chief Complaint  Patient presents with   Headache   Chest Pain    William Wood is a 56 y.o. male with medical history of alcohol abuse, cocaine abuse, depression, GERD.  Patient presents to ED for evaluation of headache and chest pain.  Patient states that he has been doing crack cocaine for the last 1 week consistently every day, multiple times a day.  Patient reports that he has been doing crack cocaine 2 days depression, stating that he does not have enough money to repair his car and he is unable to get the extra $33 he needs so he figured that he would spend the money he does have on crack cocaine out of a sense of desperation.  Patient states that last night he was transported home by [facility] after he was found wandering the street and when he arrived home he stated that his chest noted hurting.  When EMS arrived on scene, the patient only had chest pain but stated he was having a headache instead.  The patient reports that on my examination his headache has resolved.  The patient is also denying any chest pain at this time stating that it has decreased to 1 out of 10.  Patient states that this headache is typical for him after he does crack cocaine binges.  Patient denies history of cardiac events.  Patient also reported that he is out of his GERD medication, requesting refill.  Patient denies any fevers, nausea, vomiting, abdominal pain, back pain, shortness of breath.   Headache Associated symptoms: no abdominal pain, no back pain, no fever, no nausea and no vomiting   Chest Pain Associated symptoms: headache   Associated symptoms: no abdominal pain, no back pain, no fever, no nausea, no shortness of breath and no vomiting        Home Medications Prior to Admission medications   Medication Sig Start Date End Date Taking? Authorizing Provider   pantoprazole (PROTONIX) 40 MG tablet Take 1 tablet (40 mg total) by mouth daily. 02/27/22  Yes Al Decant, PA-C  metFORMIN (GLUCOPHAGE) 1000 MG tablet Take 0.5 tablets (500 mg total) by mouth daily with breakfast. 11/12/19 02/19/20  Iloabachie, Chioma E, NP      Allergies    Patient has no known allergies.    Review of Systems   Review of Systems  Constitutional:  Negative for fever.  Respiratory:  Negative for shortness of breath.   Cardiovascular:  Positive for chest pain.  Gastrointestinal:  Negative for abdominal pain, nausea and vomiting.  Musculoskeletal:  Negative for back pain.  Neurological:  Positive for headaches.  All other systems reviewed and are negative.   Physical Exam Updated Vital Signs BP 120/84 (BP Location: Left Arm)   Pulse 80   Temp 98.3 F (36.8 C) (Oral)   Resp 16   Ht 5\' 5"  (1.651 m)   Wt 60.5 kg   SpO2 97%   BMI 22.18 kg/m  Physical Exam Vitals and nursing note reviewed.  Constitutional:      General: He is not in acute distress.    Appearance: He is well-developed. He is not ill-appearing, toxic-appearing or diaphoretic.  HENT:     Head: Normocephalic and atraumatic.     Nose: Nose normal. No congestion.     Mouth/Throat:     Mouth: Mucous membranes are moist.  Pharynx: Oropharynx is clear.  Eyes:     Extraocular Movements: Extraocular movements intact.     Conjunctiva/sclera: Conjunctivae normal.     Pupils: Pupils are equal, round, and reactive to light.  Cardiovascular:     Rate and Rhythm: Normal rate and regular rhythm.  Pulmonary:     Effort: Pulmonary effort is normal.     Breath sounds: No decreased breath sounds or wheezing.  Abdominal:     General: Abdomen is flat. Bowel sounds are normal.     Palpations: Abdomen is soft.     Tenderness: There is no abdominal tenderness. There is no right CVA tenderness, left CVA tenderness, guarding or rebound.  Musculoskeletal:     Cervical back: Normal range of motion and  neck supple.     Right lower leg: No edema.     Left lower leg: No edema.  Skin:    General: Skin is warm and dry.     Capillary Refill: Capillary refill takes less than 2 seconds.  Neurological:     General: No focal deficit present.     Mental Status: He is alert and oriented to person, place, and time.     GCS: GCS eye subscore is 4. GCS verbal subscore is 5. GCS motor subscore is 6.     Cranial Nerves: Cranial nerves 2-12 are intact. No cranial nerve deficit.     Sensory: Sensation is intact. No sensory deficit.     Motor: Motor function is intact. No weakness.     Coordination: Coordination is intact. Heel to Shin Test normal.     ED Results / Procedures / Treatments   Labs (all labs ordered are listed, but only abnormal results are displayed) Labs Reviewed  BASIC METABOLIC PANEL - Abnormal; Notable for the following components:      Result Value   Potassium 3.2 (*)    Glucose, Bld 134 (*)    All other components within normal limits  CBC - Abnormal; Notable for the following components:   WBC 11.9 (*)    All other components within normal limits  URINALYSIS, ROUTINE W REFLEX MICROSCOPIC - Abnormal; Notable for the following components:   Ketones, ur 5 (*)    Protein, ur 100 (*)    Bacteria, UA RARE (*)    All other components within normal limits  TROPONIN I (HIGH SENSITIVITY)  TROPONIN I (HIGH SENSITIVITY)    EKG None  Radiology DG Chest 2 View  Result Date: 02/26/2022 CLINICAL DATA:  Chest pain EXAM: CHEST - 2 VIEW COMPARISON:  11/06/2010 FINDINGS: The heart size and mediastinal contours are within normal limits. Both lungs are clear. The visualized skeletal structures are unremarkable. IMPRESSION: No active cardiopulmonary disease. Electronically Signed   By: Jasmine Pang M.D.   On: 02/26/2022 23:34    Procedures Procedures   Medications Ordered in ED Medications  potassium chloride SA (KLOR-CON M) CR tablet 40 mEq (40 mEq Oral Given 02/27/22 9741)    ED  Course/ Medical Decision Making/ A&P                           Medical Decision Making Risk Prescription drug management.   56 year old male presents to the ED for evaluation.  Please see HPI for further details.  On examination patient afebrile and nontachycardic.  Patient lung sounds clear bilaterally, not hypoxic.  Patient abdomen soft and compressible.  The patient on examination shows no focal neurodeficits.  Patient nontoxic  in appearance.  Patient worked up utilizing the following labs imaging studies interpreted by me personally: - BMP with decreased potassium 3.2 repleted with 40 mEq potassium - CBC with leukocytosis of 11.9 however patient afebrile and nontachycardic - Troponin 12.  No delta troponins were collected, I am unsure why.  The delta troponin was set to be collected last night at 1145 however it was never done.  The patient is currently denying any chest pain.  No delta will be collected at this time. - Urinalysis unremarkable - Chest x-ray shows no cardiomegaly, mediastinal widening, effusion.  I agree with radiologist to rotation - EKG nonischemic  Patient requesting sandwich, apple juice at this time.  Patient states he feels better.  Patient denies headache, states chest pain is 1 out of 10.  Patient also requesting refill of pantoprazole.  Patient discharged home with refill of Protonix.  Patient advised to return to ED with any new symptoms/return of symptoms such as chest pain or shortness of breath.  The patient was offered resources on drug rehabilitation programs however he deferred these at this time.  The patient had return precautions given and he voiced understanding.  The patient had all his questions answered his satisfaction prior to discharge.  Patient stable at this time for discharge home.  Final Clinical Impression(s) / ED Diagnoses Final diagnoses:  Bad headache  Chest pain, unspecified type    Rx / DC Orders ED Discharge Orders           Ordered    pantoprazole (PROTONIX) 40 MG tablet  Daily        02/27/22 0907                Al Decant, PA-C 02/27/22 0908    Glendora Score, MD 02/28/22 1002

## 2022-02-27 NOTE — Discharge Instructions (Signed)
Please return to the ED with any new or worsening signs or symptoms Please follow-up with PCP Please begin taking GERD medication I prescribed you. Please read attached guide concerning nonspecific chest pain

## 2023-02-06 ENCOUNTER — Encounter (HOSPITAL_COMMUNITY): Payer: Self-pay

## 2023-02-06 ENCOUNTER — Emergency Department (HOSPITAL_COMMUNITY)
Admission: EM | Admit: 2023-02-06 | Discharge: 2023-02-07 | Disposition: A | Discharge status: Home / Self Care | Payer: Managed Care, Other (non HMO) | Attending: Emergency Medicine | Admitting: Emergency Medicine

## 2023-02-06 DIAGNOSIS — F1499 Cocaine use, unspecified with unspecified cocaine-induced disorder: Secondary | ICD-10-CM | POA: Insufficient documentation

## 2023-02-06 DIAGNOSIS — Y900 Blood alcohol level of less than 20 mg/100 ml: Secondary | ICD-10-CM | POA: Insufficient documentation

## 2023-02-06 DIAGNOSIS — F1410 Cocaine abuse, uncomplicated: Secondary | ICD-10-CM

## 2023-02-06 LAB — COMPREHENSIVE METABOLIC PANEL
ALT: 22 U/L (ref 0–44)
AST: 24 U/L (ref 15–41)
Albumin: 4 g/dL (ref 3.5–5.0)
Alkaline Phosphatase: 49 U/L (ref 38–126)
Anion gap: 10 (ref 5–15)
BUN: 15 mg/dL (ref 6–20)
CO2: 24 mmol/L (ref 22–32)
Calcium: 9.4 mg/dL (ref 8.9–10.3)
Chloride: 107 mmol/L (ref 98–111)
Creatinine, Ser: 0.93 mg/dL (ref 0.61–1.24)
GFR, Estimated: >60 mL/min (ref >60)
Glucose, Bld: 145 mg/dL — ABNORMAL HIGH (ref 70–99)
Potassium: 3.3 mmol/L — ABNORMAL LOW (ref 3.5–5.1)
Sodium: 141 mmol/L (ref 135–145)
Total Bilirubin: 0.5 mg/dL (ref 0.3–1.2)
Total Protein: 6.7 g/dL (ref 6.5–8.1)

## 2023-02-06 LAB — CBC
HCT: 41 % (ref 39.0–52.0)
Hemoglobin: 14 g/dL (ref 13.0–17.0)
MCH: 28.7 pg (ref 26.0–34.0)
MCHC: 34.1 g/dL (ref 30.0–36.0)
MCV: 84 fL (ref 80.0–100.0)
Platelets: 234 10*3/uL (ref 150–400)
RBC: 4.88 MIL/uL (ref 4.22–5.81)
RDW: 16.3 % — ABNORMAL HIGH (ref 11.5–15.5)
WBC: 8.1 10*3/uL (ref 4.0–10.5)
nRBC: 0 % (ref 0.0–0.2)

## 2023-02-06 LAB — ETHANOL: Alcohol, Ethyl (B): <10 mg/dL (ref <10)

## 2023-02-06 NOTE — ED Provider Notes (Signed)
[place] EMERGENCY DEPARTMENT AT [facility] Provider Note   CSN: [id number] Arrival date & time: 02/06/23  2244     History  Chief Complaint  Patient presents with   Detox    William Wood is a 57 y.o. male.  57 yo M with a cc of cocaine use disorder.  The patient had actually been in rehab in [place] couple months ago and unfortunately had relapsed.  He called [facility] who told him that he needed to come to the Emergency Department to be medically cleared and so he is here today.  He denies any acute issues.  Says that his right knee has been hurting him some off and on.  Denies chest pain denies difficulty breathing.        Home Medications Prior to Admission medications   Medication Sig Start Date End Date Taking? Authorizing Provider  pantoprazole (PROTONIX) 40 MG tablet Take 1 tablet (40 mg total) by mouth daily. 02/27/22   Al Decant, PA-C  metFORMIN (GLUCOPHAGE) 1000 MG tablet Take 0.5 tablets (500 mg total) by mouth daily with breakfast. 11/12/19 02/19/20  Iloabachie, Chioma E, NP      Allergies    Patient has no known allergies.    Review of Systems   Review of Systems  Physical Exam Updated Vital Signs BP 109/69   Pulse (!) 54   Temp 98.4 F (36.9 C) (Oral)   Resp 16   SpO2 97%  Physical Exam Vitals and nursing note reviewed.  Constitutional:      Appearance: He is well-developed.  HENT:     Head: Normocephalic and atraumatic.  Eyes:     Pupils: Pupils are equal, round, and reactive to light.  Neck:     Vascular: No JVD.  Cardiovascular:     Rate and Rhythm: Normal rate and regular rhythm.     Heart sounds: No murmur heard.    No friction rub. No gallop.  Pulmonary:     Effort: No respiratory distress.     Breath sounds: No wheezing.  Abdominal:     General: There is no distension.     Tenderness: There is no abdominal tenderness. There is no guarding or rebound.  Musculoskeletal:        General: Normal range of motion.      Cervical back: Normal range of motion and neck supple.     Comments: Able to range the knee without significant discomfort.  No significant edema or erythema.  No obvious ligamentous laxity.  Negative McMurray's.  Skin:    Coloration: Skin is not pale.     Findings: No rash.  Neurological:     Mental Status: He is alert and oriented to person, place, and time.  Psychiatric:        Behavior: Behavior normal.     ED Results / Procedures / Treatments   Labs (all labs ordered are listed, but only abnormal results are displayed) Labs Reviewed  COMPREHENSIVE METABOLIC PANEL - Abnormal; Notable for the following components:      Result Value   Potassium 3.3 (*)    Glucose, Bld 145 (*)    All other components within normal limits  CBC - Abnormal; Notable for the following components:   RDW 16.3 (*)    All other components within normal limits  ETHANOL  RAPID URINE DRUG SCREEN, HOSP PERFORMED    EKG None  Radiology No results found.  Procedures Procedures    Medications Ordered in ED  Medications - No data to display  ED Course/ Medical Decision Making/ A&P                                 Medical Decision Making Amount and/or Complexity of Data Reviewed Labs: ordered.   57 yo M with a chief complaints of wanting rehab for cocaine abuse.  He tells me that he is already arranged for this and has an appointment on Friday.  He was told by someone when he had made the appointment that he needed to come to the emergency department to be seen first.  He has no medical complaints with the exception of chronic right knee pain.  I feel he is medically clear without blood work but blood work was obtained in the triage process.  His potassium is trivially low at 3.3.  He has no other significant electrolyte abnormalities no anemia no leukocytosis.  Will have him follow-up with [facility].  Given a list of other places he can go if it does not work out there.  11:55 PM:  I have discussed  the diagnosis/risks/treatment options with the patient.  Evaluation and diagnostic testing in the emergency department does not suggest an emergent condition requiring admission or immediate intervention beyond what has been performed at this time.  They will follow up with PCP. We also discussed returning to the ED immediately if new or worsening sx occur. We discussed the sx which are most concerning (e.g., sudden worsening pain, fever, inability to tolerate by mouth) that necessitate immediate return. Medications administered to the patient during their visit and any new prescriptions provided to the patient are listed below.  Medications given during this visit Medications - No data to display   The patient appears reasonably screen and/or stabilized for discharge and I doubt any other medical condition or other [facility] requiring further screening, evaluation, or treatment in the ED at this time prior to discharge.          Final Clinical Impression(s) / ED Diagnoses Final diagnoses:  Cocaine use disorder [facility]    Rx / DC Orders ED Discharge Orders     None         Melene Plan, DO 02/06/23 2355

## 2023-02-06 NOTE — Discharge Instructions (Addendum)
Follow up with [facility]  

## 2023-02-06 NOTE — ED Triage Notes (Signed)
Pt states he wants to detox off cocaine, his last use was 2hrs ago, states " I'm tired of relying on it and living the way I am ". Also mentions for his new job here in [employer] that his job is requiring him to go to rehab. Does mention he does use THC, but no chronic etoh use. No HI/SI but some paranoia. No other medical complaints other than some right knee pain.

## 2023-02-07 NOTE — ED Notes (Signed)
.  The patient is A&OX4, ambulatory at d/c with independent steady gait, NAD. Pt verbalized understanding of d/c instructions and follow up care. Pt given resource guide.

## 2023-02-18 ENCOUNTER — Ambulatory Visit: Payer: Self-pay | Admitting: Physician Assistant

## 2023-02-18 ENCOUNTER — Encounter: Payer: Self-pay | Admitting: Physician Assistant

## 2023-02-18 VITALS — BP 110/68 | HR 53 | Ht 65.0 in | Wt 132.0 lb

## 2023-02-18 DIAGNOSIS — Z125 Encounter for screening for malignant neoplasm of prostate: Secondary | ICD-10-CM

## 2023-02-18 DIAGNOSIS — F1010 Alcohol abuse, uncomplicated: Secondary | ICD-10-CM

## 2023-02-18 DIAGNOSIS — E876 Hypokalemia: Secondary | ICD-10-CM

## 2023-02-18 DIAGNOSIS — Z114 Encounter for screening for human immunodeficiency virus [HIV]: Secondary | ICD-10-CM

## 2023-02-18 DIAGNOSIS — B353 Tinea pedis: Secondary | ICD-10-CM

## 2023-02-18 DIAGNOSIS — B359 Dermatophytosis, unspecified: Secondary | ICD-10-CM

## 2023-02-18 DIAGNOSIS — F419 Anxiety disorder, unspecified: Secondary | ICD-10-CM

## 2023-02-18 DIAGNOSIS — R7303 Prediabetes: Secondary | ICD-10-CM

## 2023-02-18 DIAGNOSIS — Z1159 Encounter for screening for other viral diseases: Secondary | ICD-10-CM

## 2023-02-18 DIAGNOSIS — F1410 Cocaine abuse, uncomplicated: Secondary | ICD-10-CM

## 2023-02-18 LAB — POCT GLYCOSYLATED HEMOGLOBIN (HGB A1C): Hemoglobin A1C: 5.8 % — AB (ref 4.0–5.6)

## 2023-02-18 MED ORDER — KETOCONAZOLE 2 % EX CREA: 1.0000 | TOPICAL_CREAM | Freq: Every day | CUTANEOUS | 0 refills | Status: DC

## 2023-02-18 NOTE — Patient Instructions (Addendum)
To help with your rash, you are going to use ketoconazole once daily on the affected areas.    Make sure to keep the areas clean and dry.  Your A1c is 5.8, this is considered prediabetes.  I do encourage you to follow a low sugar diet and have this rechecked in 3 to 6 months.  We will call you with today's lab results.  Roney Jaffe, PA-C Physician Assistant [facility] [email or web address]   Athlete's Foot Athlete's foot (tinea pedis) is a fungal infection of the skin on your feet. It often occurs on the skin that is between or underneath the toes. It can also occur on the soles of your feet. The infection can spread from person to person (is contagious). It can also spread when a person's bare feet come in contact with the fungus on shower floors or on items such as shoes. What are the causes? This condition is caused by a fungus that grows in warm, moist places. You can get athlete's foot by sharing shoes, shower stalls, towels, and wet floors with someone who is infected. Not washing your feet or changing your socks often enough can also lead to athlete's foot. What increases the risk? This condition is more likely to develop in: Men. People who have a weak body defense system (immune system). People who have diabetes. People who use public showers, such as at a gym. People who wear heavy-duty shoes, such as Youth worker. Seasons with warm, humid weather. What are the signs or symptoms? Symptoms of this condition include: Itchy areas between your toes or on the soles of your feet. White, flaky, or scaly areas between your toes or on the soles of your feet. Very itchy small blisters between your toes or on the soles of your feet. Small cuts in your skin. These cuts can become infected. Thick or discolored toenails. How is this diagnosed? This condition may be diagnosed with a physical exam and a review of  your medical history. Your health care provider may also take a skin or toenail sample to examine under a microscope. How is this treated? This condition is treated with antifungal medicines. These may be applied as powders, ointments, or creams. In severe cases, an oral antifungal medicine may be given. Follow these instructions at home: Medicines Apply or take over-the-counter and prescription medicines only as told by your health care provider. Apply your antifungal medicine as told by your health care provider. Do not stop using the antifungal even if your condition improves. Foot care Do not scratch your feet. Keep your feet dry: Wear cotton or wool socks. Change your socks every day or if they become wet. Wear shoes that allow air to flow, such as sandals or canvas tennis shoes. Wash and dry your feet, including the area between your toes. Also, wash and dry your feet: Every day or as told by your health care provider. After exercising. General instructions Do not let others use towels, shoes, nail clippers, or other personal items that touch your feet. Protect your feet by wearing sandals in wet areas, such as locker rooms and shared showers. Keep all follow-up visits. This is important. If you have diabetes, keep your blood sugar under control. Contact a health care provider if: You have a fever. You have swelling, soreness, warmth, or redness in your foot. Your feet are not getting better with treatment. Your symptoms get worse. You have new symptoms. You have severe pain. Summary Athlete's foot (  tinea pedis) is a fungal infection of the skin on your feet. It often occurs on skin that is between or underneath the toes. This condition is caused by a fungus that grows in warm, moist places. Symptoms include white, flaky, or scaly areas between your toes or on the soles of your feet. This condition is treated with antifungal medicines. Keep your feet clean. Always dry them  thoroughly. This information is not intended to replace advice given to you by your health care provider. Make sure you discuss any questions you have with your health care provider. Document Revised: 10/09/2020 Document Reviewed: 10/09/2020 Elsevier Patient Education  2024 [employer] Inc.  Body Ringworm Body ringworm is an infection of the skin that often causes a ring-shaped rash. Body ringworm is also called tinea corporis. Body ringworm can affect any part of your skin. This condition is easily spread from person to person (is very contagious). What are the causes? This condition is caused by fungi called dermatophytes. The condition develops when these fungi grow out of control on the skin. You can get this condition if you touch a person or animal that has it. You can also get it if you share any items with an infected person or pet. These include: Clothing, bedding, and towels. Brushes or combs. Gym equipment. Any other object that has the fungus on it. What increases the risk? You are more likely to develop this condition if you: Play sports that involve close physical contact, such as wrestling. Sweat a lot. Live in areas that are hot and humid. Use public showers. Have a weakened disease-fighting system (immune system). What are the signs or symptoms? Symptoms of this condition include: Itchy, raised red spots and bumps. Red scaly patches. A ring-shaped rash. The rash may have: A clear center. Scales or red bumps at its center. Redness near its borders. Dry and scaly skin on or around it. How is this diagnosed? This condition can usually be diagnosed with a skin exam. A skin scraping may be taken from the affected area and examined under a microscope to see if the fungus is present. How is this treated? This condition may be treated with: An antifungal cream or ointment. An antifungal shampoo. Antifungal medicines. These may be prescribed if your ringworm: Is  severe. Keeps coming back or lasts a long time. Follow these instructions at home: Take over-the-counter and prescription medicines only as told by your health care provider. If you were given an antifungal cream or ointment: Use it as told by your health care provider. Wash the infected area and dry it completely before applying the cream or ointment. If you were given an antifungal shampoo: Use it as told by your health care provider. Leave the shampoo on your body for 3-5 minutes before rinsing. While you have a rash: Wear loose clothing to stop clothes from rubbing and irritating it. Wash or change your bed sheets every night. Wash clothes and bed sheets in hot water. Disinfect or throw out items that may be infected. Wash your hands often with soap and water for at least 20 seconds. If soap and water are not available, use hand sanitizer. If your pet has the same infection, take your pet to see a veterinarian for treatment. How is this prevented? Take a bath or shower every day and after every time you work out or play sports. Dry your skin completely after bathing. Wear sandals or shoes in public places and showers. Wash athletic clothes after each use.  Do not share personal items with others. Avoid touching red patches of skin on other people. Avoid touching pets that have bald spots. If you touch an animal that has a bald spot, wash your hands. Contact a health care provider if: Your rash continues to spread after 7 days of treatment. Your rash is not gone in 4 weeks. The area around your rash gets red, warm, tender, and swollen. This information is not intended to replace advice given to you by your health care provider. Make sure you discuss any questions you have with your health care provider. Document Revised: 11/30/2021 Document Reviewed: 11/30/2021 Elsevier Patient Education  2024 [employer].

## 2023-02-18 NOTE — Progress Notes (Unsigned)
New Patient Office Visit  Subjective    Patient ID: William Wood, male    DOB: 1966-01-23  Age: 57 y.o. MRN: [id number]  CC:  Chief Complaint  Patient presents with   dermatitus    Left arm    HPI William Wood states that he is currently being treated for substance abuse at [facility] residential treatment center.  States that he arrived 8/11.  States that he has had a circular rash on his upper left arm for the past year.  Does describe it as "itchy".  States that since it has been present for so long he is unsure if he had any new medications, body washes, detergents, fragrances.  Denies any known exposure to ringworm.  States that he has tried hydrocortisone and his own urine on it without relief.  Also endorses dark itchy spots on soles of both feet and heels.  States that this has been present for several weeks, has not tried anything for relief.      Outpatient Encounter Medications as of 02/18/2023  Medication Sig   buPROPion (WELLBUTRIN XL) 150 MG 24 hr tablet Take 150 mg by mouth daily.   pantoprazole (PROTONIX) 40 MG tablet Take 1 tablet (40 mg total) by mouth daily. (Patient not taking: Reported on 02/18/2023)   [DISCONTINUED] metFORMIN (GLUCOPHAGE) 1000 MG tablet Take 0.5 tablets (500 mg total) by mouth daily with breakfast.   No facility-administered encounter medications on file as of 02/18/2023.    Past Medical History:  Diagnosis Date   Alcohol abuse 11/23/2010   Allergic rhinitis 11/07/2010   Anxiety    Bradycardia    Cocaine abuse (HCC) 11/23/2010   Congenital fusion of cervical spine    c5-c6   DDD (degenerative disc disease), cervical    c6-c7   Depression    Diabetes mellitus without complication (HCC)    GERD (gastroesophageal reflux disease)    Tobacco abuse 11/23/2010    Past Surgical History:  Procedure Laterality Date   CARDIAC CATHETERIZATION  2014   COLONOSCOPY WITH PROPOFOL N/A 09/15/2019   Procedure: COLONOSCOPY WITH PROPOFOL;  Surgeon:  Wyline Mood, MD;  Location: [facility] ENDOSCOPY;  Service: Gastroenterology;  Laterality: N/A;   ESOPHAGOGASTRODUODENOSCOPY (EGD) WITH PROPOFOL N/A 09/15/2019   Procedure: ESOPHAGOGASTRODUODENOSCOPY (EGD) WITH PROPOFOL;  Surgeon: Wyline Mood, MD;  Location: [facility] ENDOSCOPY;  Service: Gastroenterology;  Laterality: N/A;    Family History  Problem Relation Age of Onset   Stroke Paternal Aunt    Congestive Heart Failure Paternal Grandmother        '   Diabetes Other        maternal side of family   Cancer Other        grandfather   Alcohol abuse Brother    Alcohol abuse Brother    Alcohol abuse Brother     Social History   Socioeconomic History   Marital status: Married    Spouse name: Not on file   Number of children: Not on file   Years of education: Not on file   Highest education level: Not on file  Occupational History   Not on file  Tobacco Use   Smoking status: Former    Current packs/day: 0.00    Types: Cigarettes    Quit date: 06/02/2019    Years since quitting: 3.7   Smokeless tobacco: Never  Vaping Use   Vaping status: Never Used  Substance and Sexual Activity   Alcohol use: Not Currently    Comment: in  rehab   Drug use: Not Currently    Types: "Crack" cocaine, Cocaine, Marijuana    Comment: 6 months ago   Sexual activity: Yes    Birth control/protection: None  Other Topics Concern   Not on file  Social History Narrative   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  Was imprisoned in 1995 for 8.5 years for robbery with a deadly weapon.  Mother shot and killed his father while pregnant with him.  Mother was shot and killed when he was 4.  Some trade school while imprisoned.  Live in [place] with his uncle.  He has 2 sisters and 6 brothers.  Was brought up by his grandparents.  Two sons and a daughter that do not live with him.    Social Determinants of Health   Financial Resource Strain: Not on file  Food Insecurity: Not on file   Transportation Needs: Not on file  Physical Activity: Not on file  Stress: Not on file  Social Connections: Not on file  Intimate Partner Violence: Not on file    Review of Systems  Constitutional: Negative.   HENT: Negative.    Eyes: Negative.   Respiratory:  Negative for shortness of breath.   Cardiovascular:  Negative for chest pain.  Gastrointestinal: Negative.   Genitourinary: Negative.   Musculoskeletal: Negative.   Skin:  Positive for itching and rash.  Neurological: Negative.   Endo/Heme/Allergies: Negative.   Psychiatric/Behavioral: Negative.          Objective    Ht 5\' 5"  (1.651 m)   Wt 132 lb (59.9 kg)   BMI 21.97 kg/m   Physical Exam Constitutional:      Appearance: Normal appearance.  HENT:     Head: Normocephalic and atraumatic.     Right Ear: External ear normal.     Left Ear: External ear normal.     Nose: Nose normal.     Mouth/Throat:     Mouth: Mucous membranes are moist.     Pharynx: Oropharynx is clear.  Eyes:     Conjunctiva/sclera: Conjunctivae normal.     Pupils: Pupils are equal, round, and reactive to light.  Cardiovascular:     Rate and Rhythm: Normal rate and regular rhythm.     Pulses: Normal pulses.     Heart sounds: Normal heart sounds.  Pulmonary:     Effort: Pulmonary effort is normal.     Breath sounds: Normal breath sounds.  Musculoskeletal:        General: Normal range of motion.     Cervical back: Normal range of motion.  Neurological:     General: No focal deficit present.     Mental Status: He is alert and oriented to person, place, and time.  Psychiatric:        Mood and Affect: Mood normal.        Behavior: Behavior normal.        Thought Content: Thought content normal.        Judgment: Judgment normal.        Assessment & Plan:   Problem List Items Addressed This Visit   None 1. Ringworm Trial ketoconazole.  Patient education given on supportive care, red flags given for prompt reevaluation -  ketoconazole (NIZORAL) 2 % cream; Apply 1 Application topically daily.  Dispense: 15 g; Refill: 0  2. Tinea pedis of both feet Trial ketoconazole.  Patient education given on supportive care, red flags given for prompt reevaluation  3. Prediabetes A1c 5.8.  Patient encouraged to follow a low sugar diet - HgB A1c  4. Anxiety Managed by [facility] psychiatry  5. Hypokalemia  - Basic Metabolic Panel  6. Alcohol abuse Currently in substance abuse treatment program  7. Cocaine abuse (HCC)   8. Encounter for HCV screening test for low risk patient  - HCV Ab w Reflex to Quant PCR  9. Screening for HIV (human immunodeficiency virus)  - HIV antibody (with reflex)  10. Screening PSA (prostate specific antigen)  - PSA   I have reviewed the patient's medical history (PMH, PSH, Social History, Family History, Medications, and allergies) , and have been updated if relevant. I spent 30 minutes reviewing chart and  face to face time with patient.     No follow-ups on file.   Kasandra Knudsen Mayers, PA-C

## 2023-02-19 ENCOUNTER — Encounter: Payer: Self-pay | Admitting: Physician Assistant

## 2023-02-19 LAB — BASIC METABOLIC PANEL
BUN/Creatinine Ratio: 14 (ref 9–20)
BUN: 14 mg/dL (ref 6–24)
CO2: 22 mmol/L (ref 20–29)
Calcium: 9.5 mg/dL (ref 8.7–10.2)
Chloride: 101 mmol/L (ref 96–106)
Creatinine, Ser: 1.02 mg/dL (ref 0.76–1.27)
Glucose: 79 mg/dL (ref 70–99)
Potassium: 4.4 mmol/L (ref 3.5–5.2)
Sodium: 141 mmol/L (ref 134–144)
eGFR: 86 mL/min/{1.73_m2} (ref >59)

## 2023-02-19 LAB — HCV AB W REFLEX TO QUANT PCR: HCV Ab: NONREACTIVE

## 2023-02-19 LAB — PSA: Prostate Specific Ag, Serum: 0.1 ng/mL (ref 0.0–4.0)

## 2023-02-19 LAB — HIV ANTIBODY (ROUTINE TESTING W REFLEX): HIV Screen 4th Generation wRfx: NONREACTIVE

## 2023-02-19 LAB — HCV INTERPRETATION

## 2023-09-05 ENCOUNTER — Ambulatory Visit (HOSPITAL_COMMUNITY)
Admission: EM | Admit: 2023-09-05 | Discharge: 2023-09-05 | Disposition: A | Discharge status: Home / Self Care | Attending: Behavioral Health | Admitting: Behavioral Health

## 2023-09-05 DIAGNOSIS — F1490 Cocaine use, unspecified, uncomplicated: Secondary | ICD-10-CM

## 2023-09-05 DIAGNOSIS — F1410 Cocaine abuse, uncomplicated: Secondary | ICD-10-CM | POA: Insufficient documentation

## 2023-09-05 DIAGNOSIS — Z5900 Homelessness unspecified: Secondary | ICD-10-CM | POA: Insufficient documentation

## 2023-09-05 NOTE — ED Notes (Signed)
 Patient discharged by provider.

## 2023-09-05 NOTE — ED Provider Notes (Signed)
 Behavioral Health Urgent Care Medical Screening Exam  Patient Name: William Wood MRN: [id number] Date of Evaluation: 09/05/23 Chief Complaint:   Diagnosis:  Final diagnoses:  Cocaine use    History of Present illness: William Wood is a 57 y.o. male patient with a past psychiatric history significant for cocaine abuse, alcohol abuse, depression, and anxiety who presented to the [facility] care voluntary seeking residential treatment for cocaine use.  Patient reports using cocaine since age 69 years old. He reports using as much as he can get on a daily basis, on average he reports using 1 to 2 g/day. His method of cocaine use is smoking. He reports that he last used cocaine last night. He also reports smoking marijuana daily, on average a joint. He states that he does not drink alcohol that much and drinks a little 16 ounce beer occasionally. He reports substance abuse treatment at [facility] six months ago.   On exam, patient is alert and oriented x 4. His thought process is linear and goal oriented. His speech is clear and coherent. His mood is euthymic and affect is congruent.  Patient denies SI/HI/AVH. There is no objective evidence that the patient is currently responding to internal or external stimuli. He denies depressive or anxiety symptoms at this time and describes his mood as "staying upbeat." He denies difficulty sleeping. He reports a fair appetite. He states that he is homeless. He states that he is unemployed.  He denies outpatient psychiatry or counseling.  He denies taking prescribed psychiatric medications for depression or anxiety.   Plan of care. Patient to follow up with [facility] tomorrow, 09/06/23 for a screening for substance abuse treatment. Patient provided with a bus pass for transportation. Safety planning completed prior to discharge.   Flowsheet Row ED from 09/05/2023 in [facility] ED from 02/06/2023 in  [facility] Emergency Department at [facility] ED from 02/26/2022 in [facility] Emergency Department at [facility]  C-SSRS RISK CATEGORY No Risk No Risk No Risk       Psychiatric Specialty Exam  Presentation  General Appearance:Appropriate for Environment  Eye Contact:Fair  Speech:Clear and Coherent  Speech Volume:Normal  Handedness:Right   Mood and Affect  Mood:Euthymic  Affect:Congruent   Thought Process  Thought Processes:Coherent  Descriptions of Associations:Intact  Orientation:Full (Time, Place and Person)  Thought Content:Logical    Hallucinations:None  Ideas of Reference:None  Suicidal Thoughts:No  Homicidal Thoughts:No   Sensorium  Memory:Immediate Fair; Recent Fair; Remote Fair  Judgment:Fair  Insight:Fair   Executive Functions  Concentration:Fair  Attention Span:Fair  Recall:Fair  Fund of Knowledge:Fair  Language:Fair   Psychomotor Activity  Psychomotor Activity:Normal   Assets  Assets:Communication Skills; Desire for Improvement; Physical Health; Leisure Time   Sleep  Sleep:Fair   Physical Exam: Physical Exam Cardiovascular:     Rate and Rhythm: Normal rate.  Pulmonary:     Effort: Pulmonary effort is normal.  Musculoskeletal:        General: Normal range of motion.  Neurological:     Mental Status: He is alert and oriented to person, place, and time.    Review of Systems  Constitutional: Negative.   HENT: Negative.    Eyes: Negative.   Respiratory: Negative.    Cardiovascular: Negative.   Gastrointestinal: Negative.   Genitourinary: Negative.   Musculoskeletal: Negative.   Neurological: Negative.   Endo/Heme/Allergies: Negative.   Psychiatric/Behavioral:  Positive for substance abuse.    Blood pressure Marland Kitchen)  138/95, pulse (!) 59, temperature 98.4 F (36.9 C), temperature source Oral, resp. rate 18, SpO2 100%. There is no height or weight on file to calculate  BMI.  Musculoskeletal: Strength & Muscle Tone: within normal limits Gait & Station: normal Patient leans: N/A   [facility] MSE Discharge Disposition for Follow up and Recommendations: Based on my evaluation the patient does not appear to have an emergency medical condition and can be discharged with resources and follow up care in outpatient services for substance abuse treatment.   Discharge recommendations:   Outpatient Follow up: Please review list of substance abuse resources for counseling and residential treatment.  Therapy: We recommend that patient participate in individual therapy to address mental health concerns.  Safety:   The following safety precautions should be taken:   No sharp objects. This includes scissors, razors, scrapers, and putty knives.   Chemicals should be removed and locked up.   Medications should be removed and locked up.   Weapons should be removed and locked up. This includes firearms, knives and instruments that can be used to cause injury.   The patient should abstain from use of illicit substances/drugs and abuse of any medications.  If symptoms worsen or do not continue to improve or if the patient becomes actively suicidal or homicidal then it is recommended that the patient return to the closest hospital emergency department, the [facility], or call 911 for further evaluation and treatment. National Suicide Prevention Lifeline 1-800-SUICIDE or (937)579-2386.  About 988 988 offers 24/7 access to trained crisis counselors who can help people experiencing mental health-related distress. People can call or text 988 or chat 988lifeline.org for themselves or if they are worried about a loved one who may need crisis support.    Follow-up Information     Services, [facility].   Why: Please contact to determine bed availability for residential treatment services. Contact information: [address] [place]  [place] [address] [phone]                  Layla Barter, NP 09/05/2023, 4:02 PM

## 2023-09-05 NOTE — Congregational Nurse Program (Signed)
 William Wood is requesting help with detox services RN will get safe transport for client to get to [facility] to be evaluated and seen by a provider. He states he is withdrawing from crack cocaine. RN will continue following and supporting client as needed.

## 2023-09-05 NOTE — Progress Notes (Signed)
   09/05/23 1433  [facility] Triage Screening (Walk-ins at [facility] only)  How Did You Hear About [place]? Self  What Is the Reason for Your Visit/Call Today? William Wood is a 58 year old male with a self reported history of depression, anxiety and cocaine use with chief complaint of "I'm tired of using". Pt reports he smokes 2-3 grams of crack daily for the past 6-8 months and is requesting a 30 day substance use program. Pt denies SI, HI, AVH (VH when high). Pt is homeless and malodorous.  How Long Has This Been Causing You Problems? > than 6 months  Have You Recently Had Any Thoughts About Hurting Yourself? No  Are You Planning to Commit Suicide/Harm Yourself At This time? No  Have you Recently Had Thoughts About Hurting Someone William Wood? No  Are You Planning To Harm Someone At This Time? No  Physical Abuse Denies  Verbal Abuse Denies  Sexual Abuse Denies  Exploitation of patient/patient's resources Denies  Self-Neglect Denies  Are you currently experiencing any auditory, visual or other hallucinations? No  Have You Used Any Alcohol or Drugs in the Past 24 Hours? Yes  What Did You Use and How Much? yesterday did 2 grams of crack  Do you have any current medical co-morbidities that require immediate attention? Yes  Please describe current medical co-morbidities that require immediate attention: fingers are numb  Clinician description of patient physical appearance/behavior: calm, engaged, layered clothing, malodorous  What Do You Feel Would Help You the Most Today? Alcohol or Drug Use Treatment  If access to [facility] Urgent Care was not available, would you have sought care in the Emergency Department? No  Determination of Need Routine (7 days)  Options For Referral Medication Management;Outpatient Therapy;Facility-Based Crisis;Intensive Outpatient Therapy

## 2023-09-05 NOTE — Discharge Instructions (Addendum)
 Discharge recommendations:   Outpatient Follow up: Please review list of substance abuse resources for counseling and residential treatment.  Therapy: We recommend that patient participate in individual therapy to address mental health concerns.  Safety:   The following safety precautions should be taken:   No sharp objects. This includes scissors, razors, scrapers, and putty knives.   Chemicals should be removed and locked up.   Medications should be removed and locked up.   Weapons should be removed and locked up. This includes firearms, knives and instruments that can be used to cause injury.   The patient should abstain from use of illicit substances/drugs and abuse of any medications.  If symptoms worsen or do not continue to improve or if the patient becomes actively suicidal or homicidal then it is recommended that the patient return to the closest hospital emergency department, the [facility], or call 911 for further evaluation and treatment. National Suicide Prevention Lifeline 1-800-SUICIDE or 845 681 4540.  About 988 988 offers 24/7 access to trained crisis counselors who can help people experiencing mental health-related distress. People can call or text 988 or chat 988lifeline.org for themselves or if they are worried about a loved one who may need crisis support.

## 2023-11-21 ENCOUNTER — Other Ambulatory Visit: Payer: Self-pay

## 2023-11-21 ENCOUNTER — Emergency Department (HOSPITAL_BASED_OUTPATIENT_CLINIC_OR_DEPARTMENT_OTHER)
Admission: EM | Admit: 2023-11-21 | Discharge: 2023-11-21 | Disposition: A | Discharge status: Home / Self Care | Payer: Self-pay | Attending: Emergency Medicine | Admitting: Emergency Medicine

## 2023-11-21 DIAGNOSIS — F1410 Cocaine abuse, uncomplicated: Secondary | ICD-10-CM | POA: Insufficient documentation

## 2023-11-21 LAB — CBC
HCT: 42.1 % (ref 39.0–52.0)
Hemoglobin: 14.3 g/dL (ref 13.0–17.0)
MCH: 29.5 pg (ref 26.0–34.0)
MCHC: 34 g/dL (ref 30.0–36.0)
MCV: 86.8 fL (ref 80.0–100.0)
Platelets: 219 10*3/uL (ref 150–400)
RBC: 4.85 MIL/uL (ref 4.22–5.81)
RDW: 15.5 % (ref 11.5–15.5)
WBC: 5.9 10*3/uL (ref 4.0–10.5)
nRBC: 0 % (ref 0.0–0.2)

## 2023-11-21 LAB — COMPREHENSIVE METABOLIC PANEL WITH GFR
ALT: 10 U/L (ref 0–44)
AST: 16 U/L (ref 15–41)
Albumin: 4.6 g/dL (ref 3.5–5.0)
Alkaline Phosphatase: 65 U/L (ref 38–126)
Anion gap: 11 (ref 5–15)
BUN: 14 mg/dL (ref 6–20)
CO2: 24 mmol/L (ref 22–32)
Calcium: 9.2 mg/dL (ref 8.9–10.3)
Chloride: 104 mmol/L (ref 98–111)
Creatinine, Ser: 0.95 mg/dL (ref 0.61–1.24)
GFR, Estimated: >60 mL/min (ref >60)
Glucose, Bld: 95 mg/dL (ref 70–99)
Potassium: 4.4 mmol/L (ref 3.5–5.1)
Sodium: 139 mmol/L (ref 135–145)
Total Bilirubin: 0.3 mg/dL (ref 0.0–1.2)
Total Protein: 6.8 g/dL (ref 6.5–8.1)

## 2023-11-21 LAB — ETHANOL: Alcohol, Ethyl (B): <15 mg/dL (ref <15)

## 2023-11-21 NOTE — ED Provider Notes (Signed)
 [place] EMERGENCY DEPARTMENT AT [facility] Provider Note   CSN: [id number] Arrival date & time: 11/21/23  1123     History  Chief Complaint  Patient presents with   Medical Clearance    William Wood is a 58 y.o. male.  HPI 58 year old male presents from [facility] for evaluation of possible withdrawal. Patient states he uses cocaine daily, last using on 5/19.  He occasionally uses alcohol but not every day and when he does drink he will drink 3 mikes hard lemonade.  Last drink alcohol on 5/18.  Last night he had some sweating but has otherwise had no symptoms including no headache, fever, cough, chest pain, tremors, abdominal pain, vomiting, etc.  Right now he feels fine but was sent over for evaluation.  No suicidal thoughts or depression.  Home Medications Prior to Admission medications   Medication Sig Start Date End Date Taking? Authorizing Provider  buPROPion (WELLBUTRIN XL) 150 MG 24 hr tablet Take 150 mg by mouth daily.    [provider]  ketoconazole  (NIZORAL ) 2 % cream Apply 1 Application topically daily. 02/18/23   Mayers, Cari S, PA-C  pantoprazole  (PROTONIX ) 40 MG tablet Take 1 tablet (40 mg total) by mouth daily. Patient not taking: Reported on 02/18/2023 02/27/22   Adel Aden, PA-C  metFORMIN  (GLUCOPHAGE ) 1000 MG tablet Take 0.5 tablets (500 mg total) by mouth daily with breakfast. 11/12/19 02/19/20  Iloabachie, Chioma E, NP      Allergies    Patient has no known allergies.    Review of Systems   Review of Systems  Constitutional:  Positive for diaphoresis. Negative for fever.  Respiratory:  Negative for shortness of breath.   Cardiovascular:  Negative for chest pain.  Gastrointestinal:  Negative for abdominal pain and vomiting.  Neurological:  Negative for tremors.    Physical Exam Updated Vital Signs BP 130/80 (BP Location: Left Arm)   Pulse (!) 57   Temp 97.7 F (36.5 C) (Oral)   Resp 15   Ht 5\' 5"  (1.651 m)   Wt 57.1 kg    SpO2 100%   BMI 20.95 kg/m  Physical Exam Vitals and nursing note reviewed.  Constitutional:      General: He is not in acute distress.    Appearance: He is well-developed. He is not ill-appearing or diaphoretic.  HENT:     Head: Normocephalic and atraumatic.  Cardiovascular:     Rate and Rhythm: Normal rate and regular rhythm.     Heart sounds: Normal heart sounds.  Pulmonary:     Effort: Pulmonary effort is normal.     Breath sounds: Normal breath sounds.  Abdominal:     Palpations: Abdomen is soft.     Tenderness: There is no abdominal tenderness.  Skin:    General: Skin is warm and dry.  Neurological:     Mental Status: He is alert.     Motor: No tremor.     ED Results / Procedures / Treatments   Labs (all labs ordered are listed, but only abnormal results are displayed) Labs Reviewed  COMPREHENSIVE METABOLIC PANEL WITH GFR  ETHANOL  CBC  URINE DRUG SCREEN    EKG None  Radiology No results found.  Procedures Procedures    Medications Ordered in ED Medications - No data to display  ED Course/ Medical Decision Making/ A&P  Medical Decision Making Amount and/or Complexity of Data Reviewed Labs: ordered.   Patient's labs are obtained in triage are unremarkable.  On history and physical he is showing no acute signs of withdrawal.  Unclear why he had some diaphoresis in the middle of the night but he has no chest pain, shortness of breath, or other symptoms.  At this point, I think he is at low risk for significant withdrawal and stable for discharge back to [facility].  Will discharge with return precautions.        Final Clinical Impression(s) / ED Diagnoses Final diagnoses:  Cocaine abuse [facility])    Rx / DC Orders ED Discharge Orders     None         Jerilynn Montenegro, MD 11/21/23 1229

## 2023-11-21 NOTE — Discharge Instructions (Signed)
 Turn to the ER if you depression, suicidal thoughts, tremors, or any other new/concerning symptoms.

## 2023-11-21 NOTE — ED Triage Notes (Signed)
 Pt POV steady gait- pt reports being sent here by [facility] for "Medical eval for possible detox."  Pt last drank ETOH Saturday, last used Monday (cocaine). Denies withdrawal sx at this time. Pt alert and calm at time of triage, no tremor noted. Denies AVH.

## 2024-01-28 ENCOUNTER — Other Ambulatory Visit (HOSPITAL_COMMUNITY): Payer: Self-pay

## 2024-02-27 ENCOUNTER — Encounter: Payer: Self-pay | Admitting: Internal Medicine

## 2024-02-27 ENCOUNTER — Other Ambulatory Visit: Payer: Self-pay

## 2024-02-27 NOTE — Progress Notes (Signed)
 58 Year old PMH drug used ( cocaine, previous history of alcohol, GERD, Depression presents complaining of right side abdominal pain, started today, associated with gas, and reflux. He is taking protonix . His abdominal exam was benign. Abdomen was soft, NT, ND, no guarding. He denies weight loss, and melena. We talk about diet recommendation, avoid alcohol and drugs.   He was also complaining of left foot blister and small open wound, on and off chronic problems, blister burst 3 days ago. Wound was clean with cleanser, triple antibiotics apply, and dressing. He has has some hyperpigmentation in the palms of his hands.   He will be refer to PCP. He needs RPR, HIV, Vitamins level. He will need appropriate screening for age.  He relates he had lab work recently 3 weeks ago at [facility]. They will only call him with positive test results. Advised to apply triple antibiotics to wound twice a day.  Drug Counseling.

## 2024-03-02 ENCOUNTER — Emergency Department (HOSPITAL_COMMUNITY)
Admission: EM | Admit: 2024-03-02 | Discharge: 2024-03-02 | Disposition: A | Discharge status: Home / Self Care | Payer: Self-pay | Attending: Emergency Medicine | Admitting: Emergency Medicine

## 2024-03-02 ENCOUNTER — Encounter (HOSPITAL_COMMUNITY): Payer: Self-pay | Admitting: Emergency Medicine

## 2024-03-02 DIAGNOSIS — X58XXXA Exposure to other specified factors, initial encounter: Secondary | ICD-10-CM | POA: Insufficient documentation

## 2024-03-02 DIAGNOSIS — S39012A Strain of muscle, fascia and tendon of lower back, initial encounter: Secondary | ICD-10-CM | POA: Insufficient documentation

## 2024-03-02 DIAGNOSIS — E876 Hypokalemia: Secondary | ICD-10-CM | POA: Insufficient documentation

## 2024-03-02 DIAGNOSIS — R21 Rash and other nonspecific skin eruption: Secondary | ICD-10-CM | POA: Insufficient documentation

## 2024-03-02 LAB — CBC WITH DIFFERENTIAL/PLATELET
Abs Immature Granulocytes: 0.01 K/uL (ref 0.00–0.07)
Basophils Absolute: 0.1 K/uL (ref 0.0–0.1)
Basophils Relative: 1 %
Eosinophils Absolute: 0.2 K/uL (ref 0.0–0.5)
Eosinophils Relative: 3 %
HCT: 41 % (ref 39.0–52.0)
Hemoglobin: 13.7 g/dL (ref 13.0–17.0)
Immature Granulocytes: 0 %
Lymphocytes Relative: 24 %
Lymphs Abs: 1.8 K/uL (ref 0.7–4.0)
MCH: 28.2 pg (ref 26.0–34.0)
MCHC: 33.4 g/dL (ref 30.0–36.0)
MCV: 84.4 fL (ref 80.0–100.0)
Monocytes Absolute: 0.8 K/uL (ref 0.1–1.0)
Monocytes Relative: 10 %
Neutro Abs: 4.7 K/uL (ref 1.7–7.7)
Neutrophils Relative %: 62 %
Platelets: 207 K/uL (ref 150–400)
RBC: 4.86 MIL/uL (ref 4.22–5.81)
RDW: 14.9 % (ref 11.5–15.5)
WBC: 7.6 K/uL (ref 4.0–10.5)
nRBC: 0 % (ref 0.0–0.2)

## 2024-03-02 LAB — URINALYSIS, ROUTINE W REFLEX MICROSCOPIC
Bilirubin Urine: NEGATIVE
Glucose, UA: NEGATIVE mg/dL
Hgb urine dipstick: NEGATIVE
Ketones, ur: 5 mg/dL — AB
Leukocytes,Ua: NEGATIVE
Nitrite: NEGATIVE
Protein, ur: NEGATIVE mg/dL
Specific Gravity, Urine: 1.026 (ref 1.005–1.030)
pH: 5 (ref 5.0–8.0)

## 2024-03-02 LAB — I-STAT CHEM 8, ED
BUN: 15 mg/dL (ref 6–20)
Calcium, Ion: 1.17 mmol/L (ref 1.15–1.40)
Chloride: 102 mmol/L (ref 98–111)
Creatinine, Ser: 0.9 mg/dL (ref 0.61–1.24)
Glucose, Bld: 116 mg/dL — ABNORMAL HIGH (ref 70–99)
HCT: 45 % (ref 39.0–52.0)
Hemoglobin: 15.3 g/dL (ref 13.0–17.0)
Potassium: 3.1 mmol/L — ABNORMAL LOW (ref 3.5–5.1)
Sodium: 140 mmol/L (ref 135–145)
TCO2: 24 mmol/L (ref 22–32)

## 2024-03-02 MED ORDER — METHOCARBAMOL 500 MG PO TABS: 500.0000 mg | ORAL_TABLET | Freq: Two times a day (BID) | ORAL | 0 refills | Status: DC | PRN

## 2024-03-02 MED ORDER — IBUPROFEN 600 MG PO TABS: 600.0000 mg | ORAL_TABLET | Freq: Four times a day (QID) | ORAL | 0 refills | Status: DC | PRN

## 2024-03-02 MED ORDER — ACETAMINOPHEN 500 MG PO TABS
1000.0000 mg | ORAL_TABLET | Freq: Once | ORAL | Status: AC
  Administered 2024-03-02: 1000 mg via ORAL
  Filled 2024-03-02: qty 2

## 2024-03-02 MED ORDER — LIDOCAINE 5 % EX PTCH
1.0000 | MEDICATED_PATCH | CUTANEOUS | Status: DC
  Administered 2024-03-02: 1 via TRANSDERMAL
  Filled 2024-03-02: qty 1

## 2024-03-02 MED ORDER — POTASSIUM CHLORIDE CRYS ER 20 MEQ PO TBCR
40.0000 meq | EXTENDED_RELEASE_TABLET | Freq: Once | ORAL | Status: AC
  Administered 2024-03-02: 40 meq via ORAL
  Filled 2024-03-02: qty 2

## 2024-03-02 MED ORDER — IBUPROFEN 400 MG PO TABS
600.0000 mg | ORAL_TABLET | Freq: Once | ORAL | Status: AC
  Administered 2024-03-02: 600 mg via ORAL
  Filled 2024-03-02: qty 1

## 2024-03-02 NOTE — Discharge Instructions (Addendum)
 You were seen in the emergency department today for evaluation of your symptoms.  Your lab work shows that you have a mild decrease in your potassium.  Please make sure you call the primary care doctor for reevaluation of this.  Additionally, I have sent you another referral for dermatologist for you to follow-up with.  Please call to schedule an appointment for the rash on your feet and hands.  For your back pain, I recommend picking up over-the-counter lidocaine  patches to apply.  I have sent in a prescription for ibuprofen  and muscle relaxers.  Please do not drive or operate heavy missionary while on this medication to make you sleepy.  Additionally, I have attached additional information for you to review.  Please help with your MyChart for results.  If you have any concerns, new or worsening symptoms, please return to your nearest ER for evaluation. Contact a health care provider if: Your back pain does not improve after several weeks of treatment. Your symptoms get worse. You have a fever. Get help right away if: Your back pain is severe. You are unable to stand or walk. You develop pain in your legs. You have weakness in your buttocks or legs. You have trouble controlling when you urinate or when you have a bowel movement. You have frequent, painful, or bloody urination.

## 2024-03-02 NOTE — ED Provider Notes (Signed)
 [place] EMERGENCY DEPARTMENT AT [facility] Provider Note   CSN: [id number] Arrival date & time: 03/02/24  1012     Patient presents with: Back Pain   William Wood is a 58 y.o. male with history of cocaine use presents emerged from today for evaluation of right lower back pain for the past 3 days.  He denies any heavy lifting or trauma to the area.  He denies any saddle anesthesia, IV drug use, fever, or fecal or urinary incontinence.  He denies any radiation of the pain.  He has not tried any medication prior to arrival.  Additionally, he mentions that he has a rash on the bottom of his feet that he has had for his whole life and was told by someone that it could be syphilis and is requesting testing for this.  He denies any dysuria, hematuria, or penile discharge.   Back Pain Associated symptoms: no chest pain, no dysuria, no fever, no numbness and no weakness        Prior to Admission medications   Medication Sig Start Date End Date Taking? Authorizing Provider  ibuprofen  (ADVIL ) 600 MG tablet Take 1 tablet (600 mg total) by mouth every 6 (six) hours as needed. 03/02/24  Yes [name], PA-C  methocarbamol  (ROBAXIN ) 500 MG tablet Take 1 tablet (500 mg total) by mouth 2 (two) times daily as needed. 03/02/24  Yes [name], PA-C  buPROPion (WELLBUTRIN XL) 150 MG 24 hr tablet Take 150 mg by mouth daily.    [provider]  ketoconazole  (NIZORAL ) 2 % cream Apply 1 Application topically daily. 02/18/23   Mayers, Cari S, PA-C  pantoprazole  (PROTONIX ) 40 MG tablet Take 1 tablet (40 mg total) by mouth daily. Patient not taking: Reported on 02/18/2023 02/27/22   Ruthell Lonni FALCON, PA-C  metFORMIN  (GLUCOPHAGE ) 1000 MG tablet Take 0.5 tablets (500 mg total) by mouth daily with breakfast. 11/12/19 02/19/20  Iloabachie, Chioma E, NP    Allergies: Patient has no known allergies.    Review of Systems  Constitutional:  Negative for chills and fever.  Respiratory:   Negative for shortness of breath.   Cardiovascular:  Negative for chest pain.  Gastrointestinal:  Negative for abdominal distention, constipation, diarrhea, nausea and vomiting.       Denies any fecal incontinence  Genitourinary:  Negative for dysuria and hematuria.       Denies any urinary incontinence or urinary retention.  Musculoskeletal:  Positive for back pain.       Denies any saddle anesthesia  Skin:  Positive for rash.  Neurological:  Negative for weakness and numbness.    Updated Vital Signs BP 109/79 (BP Location: Right Arm)   Pulse 82   Temp 98.4 F (36.9 C)   Resp 16   SpO2 98%   Physical Exam Vitals and nursing note reviewed.  Constitutional:      General: He is not in acute distress.    Appearance: He is not ill-appearing or toxic-appearing.  Eyes:     General: No scleral icterus. Pulmonary:     Effort: Pulmonary effort is normal. No respiratory distress.  Musculoskeletal:        General: Tenderness present.       Back:     Comments: Palpable muscle spasm to the marked area above.  No overlying skin changes noted.  No midline tenderness palpation.  Moving all extremities.  Skin:    General: Skin is warm and dry.     Comments:  Scattered macules present on the bilateral palms and soles.  No shackle-like rash.  No skin sloughing. No blistering. Nontender  Neurological:     Mental Status: He is alert.     (all labs ordered are listed, but only abnormal results are displayed) Labs Reviewed  URINALYSIS, ROUTINE W REFLEX MICROSCOPIC - Abnormal; Notable for the following components:      Result Value   APPearance HAZY (*)    Ketones, ur 5 (*)    All other components within normal limits  I-STAT CHEM 8, ED - Abnormal; Notable for the following components:   Potassium 3.1 (*)    Glucose, Bld 116 (*)    All other components within normal limits  CBC WITH DIFFERENTIAL/PLATELET  RPR  GC/CHLAMYDIA PROBE AMP ([place]) NOT AT [facility]     EKG: None  Radiology: No results found.  Procedures   Medications Ordered in the ED  lidocaine  (LIDODERM ) 5 % 1 patch (has no administration in time range)  acetaminophen  (TYLENOL ) tablet 1,000 mg (has no administration in time range)  ibuprofen  (ADVIL ) tablet 600 mg (has no administration in time range)    Medical Decision Making Risk OTC drugs. Prescription drug management.   58 y.o. male presents to the ER for evaluation of right-sided back pain. Differential diagnosis includes but is not limited to Fracture (acute/chronic), muscle strain, cauda equina, spinal stenosis, DDD, metastatic cancer, vertebral osteomyelitis, kidney stone, pyelonephritis, AAA, pancreatitis, bowel obstruction, meningitis. Vital signs unremarkable. Physical exam as noted above.   Workup initiated in triage.  The patient has no focal neurodeficits.  No radiation was pain.  No midline tenderness.  I do not think any emergent imaging is needed at this time.  RPR ordered.  Patient has been sleeping on stretcher no acute distress.  I independently reviewed and interpreted the patient's labs.  CBC unremarkable.  I-STAT Chem-8 shows potassium 3.1, glucose at 116, otherwise unremarkable.  Urinalysis shows hazy urine with 5 ketones otherwise unremarkable.  RPR and GC pending.  Patient does not have any red flag symptoms.  No trauma.  He has no midline tenderness, all paraspinal.  He has no fever or nuchal rigidity, doubt any meningitis.  He is afebrile with no leukocytosis and denies IV drug use, doubt any epidural abscess.  Does not have any other red flag symptoms, doubt cauda equina.  Likely muscular strain.  Will give him dose of Tylenol , ibuprofen , and lidocaine  patch.  I have also given him some oral placement of potassium given his mild hypokalemia.  For his rash, it is macular on his bilateral hands and bilateral soles of his feet however, patient reports that it has been present his whole life.  Doubt  SJS because of this.  If none present his whole life, doubtful with syphilis as well. Less likely endocarditis given timeframe and no other symptoms.  However, we will have him follow-up with his MyChart results.  Will send him referral for dermatology.  Stable for discharge home.  We discussed the results of the labs/imaging. The plan is supportive care, take medication, follow-up with dermatology. We discussed strict return precautions and red flag symptoms. The patient verbalized their understanding and agrees to the plan. The patient is stable and being discharged home in good condition.  Portions of this report may have been transcribed using voice recognition software. Every effort was made to ensure accuracy; however, inadvertent computerized transcription errors may be present.    Final diagnoses:  Strain of lumbar region, initial encounter  ED Discharge Orders          Ordered    methocarbamol  (ROBAXIN ) 500 MG tablet  2 times daily PRN        03/02/24 1350    ibuprofen  (ADVIL ) 600 MG tablet  Every 6 hours PRN        03/02/24 1350               [name], PA-C 03/02/24 1403    Tegeler, Lonni PARAS, MD 03/02/24 [phone]

## 2024-03-02 NOTE — ED Triage Notes (Signed)
 C/o lower back pain onset 3 days ago denies injury

## 2024-03-02 NOTE — ED Provider Triage Note (Signed)
 Emergency Medicine Provider Triage Evaluation Note  William Wood , a 58 y.o. male  was evaluated in triage.  Pt complains of right-sided back pain has been going on for 3 days.  States he is also concerned he has syphilis [facility] the lesions on his hand may be syphilis.  He would like testing for this as well.  Review of Systems  Positive: Lower back pain Negative: Falls, urinary symptoms, penile discharge  Physical Exam  BP 109/79 (BP Location: Right Arm)   Pulse 82   Temp 98.4 F (36.9 C)   Resp 16   SpO2 98%  Gen:   Awake, no distress   Resp:  Normal effort  MSK:   Right lower back musculature tenderness to palpation  Medical Decision Making  Medically screening exam initiated at 10:29 AM.  Appropriate orders placed.  Roxie C Oberholzer was informed that the remainder of the evaluation will be completed by another provider, this initial triage assessment does not replace that evaluation, and the importance of remaining in the ED until their evaluation is complete.     Gennaro Duwaine CROME, DO 03/02/24 1137

## 2024-03-03 LAB — RPR: RPR Ser Ql: NONREACTIVE

## 2024-03-04 LAB — GC/CHLAMYDIA PROBE AMP (~~LOC~~) NOT AT ARMC
Chlamydia: NEGATIVE
Comment: NEGATIVE
Comment: NORMAL
Neisseria Gonorrhea: NEGATIVE

## 2024-03-22 ENCOUNTER — Encounter (HOSPITAL_COMMUNITY): Payer: Self-pay | Admitting: Emergency Medicine

## 2024-03-22 ENCOUNTER — Emergency Department (HOSPITAL_COMMUNITY): Payer: Self-pay

## 2024-03-22 ENCOUNTER — Emergency Department (HOSPITAL_COMMUNITY)
Admission: EM | Admit: 2024-03-22 | Discharge: 2024-03-23 | Disposition: A | Discharge status: Home / Self Care | Payer: Self-pay | Attending: Emergency Medicine | Admitting: Emergency Medicine

## 2024-03-22 ENCOUNTER — Other Ambulatory Visit: Payer: Self-pay

## 2024-03-22 DIAGNOSIS — E876 Hypokalemia: Secondary | ICD-10-CM | POA: Insufficient documentation

## 2024-03-22 DIAGNOSIS — R4182 Altered mental status, unspecified: Secondary | ICD-10-CM | POA: Insufficient documentation

## 2024-03-22 DIAGNOSIS — Z5900 Homelessness unspecified: Secondary | ICD-10-CM | POA: Insufficient documentation

## 2024-03-22 LAB — URINALYSIS, ROUTINE W REFLEX MICROSCOPIC
Bacteria, UA: NONE SEEN
Bilirubin Urine: NEGATIVE
Glucose, UA: NEGATIVE mg/dL
Hgb urine dipstick: NEGATIVE
Ketones, ur: 5 mg/dL — AB
Leukocytes,Ua: NEGATIVE
Nitrite: NEGATIVE
Protein, ur: 30 mg/dL — AB
Specific Gravity, Urine: 1.019 (ref 1.005–1.030)
pH: 5 (ref 5.0–8.0)

## 2024-03-22 LAB — URINE DRUG SCREEN
Amphetamines: NEGATIVE
Barbiturates: NEGATIVE
Benzodiazepines: NEGATIVE
Cocaine: POSITIVE — AB
Fentanyl: NEGATIVE
Methadone Scn, Ur: NEGATIVE
Opiates: NEGATIVE
Tetrahydrocannabinol: POSITIVE — AB

## 2024-03-22 LAB — CBC WITH DIFFERENTIAL/PLATELET
Abs Immature Granulocytes: 0.01 K/uL (ref 0.00–0.07)
Basophils Absolute: 0 K/uL (ref 0.0–0.1)
Basophils Relative: 1 %
Eosinophils Absolute: 0.2 K/uL (ref 0.0–0.5)
Eosinophils Relative: 3 %
HCT: 36.4 % — ABNORMAL LOW (ref 39.0–52.0)
Hemoglobin: 11.4 g/dL — ABNORMAL LOW (ref 13.0–17.0)
Immature Granulocytes: 0 %
Lymphocytes Relative: 23 %
Lymphs Abs: 1.3 K/uL (ref 0.7–4.0)
MCH: 27.7 pg (ref 26.0–34.0)
MCHC: 31.3 g/dL (ref 30.0–36.0)
MCV: 88.6 fL (ref 80.0–100.0)
Monocytes Absolute: 0.4 K/uL (ref 0.1–1.0)
Monocytes Relative: 7 %
Neutro Abs: 3.9 K/uL (ref 1.7–7.7)
Neutrophils Relative %: 66 %
Platelets: 248 K/uL (ref 150–400)
RBC: 4.11 MIL/uL — ABNORMAL LOW (ref 4.22–5.81)
RDW: 15.9 % — ABNORMAL HIGH (ref 11.5–15.5)
WBC: 5.8 K/uL (ref 4.0–10.5)
nRBC: 0 % (ref 0.0–0.2)

## 2024-03-22 LAB — COMPREHENSIVE METABOLIC PANEL WITH GFR
ALT: 17 U/L (ref 0–44)
AST: 24 U/L (ref 15–41)
Albumin: 3.9 g/dL (ref 3.5–5.0)
Alkaline Phosphatase: 62 U/L (ref 38–126)
Anion gap: 15 (ref 5–15)
BUN: 14 mg/dL (ref 6–20)
CO2: 21 mmol/L — ABNORMAL LOW (ref 22–32)
Calcium: 8.3 mg/dL — ABNORMAL LOW (ref 8.9–10.3)
Chloride: 109 mmol/L (ref 98–111)
Creatinine, Ser: 1.06 mg/dL (ref 0.61–1.24)
GFR, Estimated: >60 mL/min (ref >60)
Glucose, Bld: 120 mg/dL — ABNORMAL HIGH (ref 70–99)
Potassium: 3.2 mmol/L — ABNORMAL LOW (ref 3.5–5.1)
Sodium: 145 mmol/L (ref 135–145)
Total Bilirubin: 0.4 mg/dL (ref 0.0–1.2)
Total Protein: 5.9 g/dL — ABNORMAL LOW (ref 6.5–8.1)

## 2024-03-22 LAB — ETHANOL: Alcohol, Ethyl (B): <15 mg/dL (ref <15)

## 2024-03-22 LAB — TROPONIN T, HIGH SENSITIVITY
Troponin T High Sensitivity: <15 ng/L (ref 0–19)
Troponin T High Sensitivity: <15 ng/L (ref 0–19)

## 2024-03-22 LAB — LIPASE, BLOOD: Lipase: 18 U/L (ref 11–51)

## 2024-03-22 MED ORDER — SODIUM CHLORIDE 0.9 % IV BOLUS
1000.0000 mL | Freq: Once | INTRAVENOUS | Status: AC
  Administered 2024-03-22: 1000 mL via INTRAVENOUS

## 2024-03-22 MED ORDER — NALOXONE HCL 2 MG/2ML IJ SOSY
1.0000 mg | PREFILLED_SYRINGE | Freq: Once | INTRAMUSCULAR | Status: AC
  Administered 2024-03-22: 1 mg via INTRAVENOUS
  Filled 2024-03-22: qty 2

## 2024-03-22 MED ORDER — SODIUM CHLORIDE 0.9 % IV BOLUS
1000.0000 mL | Freq: Once | INTRAVENOUS | Status: AC
  Administered 2024-03-23: 1000 mL via INTRAVENOUS

## 2024-03-22 NOTE — ED Provider Notes (Signed)
 [place] EMERGENCY DEPARTMENT AT [facility] Provider Note   CSN: [id number] Arrival date & time: 03/22/24  8190     Patient presents with: Altered Mental Status and Drug Problem   William Wood is a 58 y.o. male.   HPI Reports he is not exactly sure what happened.  He reports he thought he had smoked a blunt.  He reports he took 2 hits off of it but it was something different.  He reports that he felt really weird after he smoked it.  He was then found by bystanders running around the neighbors yard screaming.  Ultimately EMS came and the patient was seated in the yard.  No specific injury was witnessed.  Patient denies that he has any headache or neck pain.  He denies that he recalls falling or injuring himself.  He reports that he just feels really weird and sleepy now after smoking what ever it was that somebody gave him.  Denies chest pain or shortness of breath.    Prior to Admission medications   Medication Sig Start Date End Date Taking? Authorizing Provider  buPROPion (WELLBUTRIN XL) 150 MG 24 hr tablet Take 150 mg by mouth daily.    [provider]  ibuprofen  (ADVIL ) 600 MG tablet Take 1 tablet (600 mg total) by mouth every 6 (six) hours as needed. 03/02/24   [name], PA-C  ketoconazole  (NIZORAL ) 2 % cream Apply 1 Application topically daily. 02/18/23   Mayers, Cari S, PA-C  methocarbamol  (ROBAXIN ) 500 MG tablet Take 1 tablet (500 mg total) by mouth 2 (two) times daily as needed. 03/02/24   [name], PA-C  pantoprazole  (PROTONIX ) 40 MG tablet Take 1 tablet (40 mg total) by mouth daily. Patient not taking: Reported on 02/18/2023 02/27/22   Ruthell Lonni FALCON, PA-C  metFORMIN  (GLUCOPHAGE ) 1000 MG tablet Take 0.5 tablets (500 mg total) by mouth daily with breakfast. 11/12/19 02/19/20  Iloabachie, Chioma E, NP    Allergies: Patient has no known allergies.    Review of Systems  Updated Vital Signs BP 125/74   Pulse 69   Temp (!) 97.5 F (36.4 C)  (Oral)   Resp 12   Ht 5' 5 (1.651 m)   Wt 56.7 kg   SpO2 99%   BMI 20.80 kg/m   Physical Exam Constitutional:      Comments: Patient is somnolent but arouses to light to moderate stimulus.  He is oriented.  He gives history.  No respiratory distress.  HENT:     Head: Normocephalic and atraumatic.     Mouth/Throat:     Mouth: Mucous membranes are moist.     Pharynx: Oropharynx is clear.  Eyes:     Extraocular Movements: Extraocular movements intact.     Pupils: Pupils are equal, round, and reactive to light.  Neck:     Comments: No midline C-spine tenderness. Cardiovascular:     Rate and Rhythm: Normal rate and regular rhythm.  Pulmonary:     Effort: Pulmonary effort is normal.     Breath sounds: Normal breath sounds.  Abdominal:     General: There is no distension.     Palpations: Abdomen is soft.     Tenderness: There is no abdominal tenderness. There is no guarding.  Musculoskeletal:        General: No swelling, tenderness, deformity or signs of injury. Normal range of motion.     Right lower leg: No edema.     Left lower leg: No  edema.  Skin:    General: Skin is warm and dry.  Neurological:     Comments: Patient is somnolent but with light stimulus awakens and is answering questions appropriately.  No focal motor deficits.  He has symmetric grip strength bilaterally.  He can elevate each leg off the bed independently and hold against resistance.  Patient denies any paresthesia or sensory differential right to left on upper or lower extremities.     (all labs ordered are listed, but only abnormal results are displayed) Labs Reviewed  COMPREHENSIVE METABOLIC PANEL WITH GFR - Abnormal; Notable for the following components:      Result Value   Potassium 3.2 (*)    CO2 21 (*)    Glucose, Bld 120 (*)    Calcium 8.3 (*)    Total Protein 5.9 (*)    All other components within normal limits  CBC WITH DIFFERENTIAL/PLATELET - Abnormal; Notable for the following components:    RBC 4.11 (*)    Hemoglobin 11.4 (*)    HCT 36.4 (*)    RDW 15.9 (*)    All other components within normal limits  URINALYSIS, ROUTINE W REFLEX MICROSCOPIC - Abnormal; Notable for the following components:   Ketones, ur 5 (*)    Protein, ur 30 (*)    All other components within normal limits  URINE DRUG SCREEN - Abnormal; Notable for the following components:   Cocaine POSITIVE (*)    Tetrahydrocannabinol POSITIVE (*)    All other components within normal limits  ETHANOL  LIPASE, BLOOD  TROPONIN T, HIGH SENSITIVITY  TROPONIN T, HIGH SENSITIVITY    EKG: None  Radiology: No results found.   Procedures   Medications Ordered in the ED  sodium chloride  0.9 % bolus 1,000 mL (has no administration in time range)  sodium chloride  0.9 % bolus 1,000 mL (0 mLs Intravenous Stopped 03/22/24 2217)  naloxone  (NARCAN ) injection 1 mg (1 mg Intravenous Given 03/22/24 2237)                                    Medical Decision Making Amount and/or Complexity of Data Reviewed Labs: ordered. Radiology: ordered.  Risk Prescription drug management.   Patient presents as outlined.  He had some type of drug ingestion unclear what it was.  But first patient was agitated and now he is somnolent.  He is not having any respiratory depression.  Will continue to observe and get basic lab work.  No apparent traumatic injury.  Patient denies headache or neck pain.  No visible head trauma.  Troponin less than 15 lipase 18 GFR greater than 60 glucose 120 potassium 3.2 ethanol less than 15 white count 5.8 H&H 11 and 36.  Patient has not provided urine specimen for urine drug screen.  I have rechecked the patient several times.  He does awaken to stimulus.  However he falls back asleep relatively quickly.  Airway is remained stable.  He has not been hypoxic.  22: 30 patient awakens to stimulus.  He is aware of place, month and date and person.  However he falls back asleep quickly.  Will try 1 mg of  Narcan .  I have added CT head given persistent somnolence.  Generally however this appears still likely due to drug ingestion from earlier today.  Patient is appropriately oriented and he awakens to stimulus but goes back to sleep quickly.  CT head reviewed by  myself pending radiology review, no acute significant intracranial bleed.  Patient awakens but then goes back to sleep.  At this time I suspect this is secondary to drug ingestion earlier as described.  Patient has remained stable with respiratory status.  Plan will be to monitor until back to baseline mental status.  Anticipate patient will be stable for discharge.       Final diagnoses:  None    ED Discharge Orders     None          Armenta Canning, MD 03/23/24 0000

## 2024-03-22 NOTE — ED Triage Notes (Signed)
 Pt bib EMS from random yard. Pt found running around in neighborhood yard screaming for help. Pt states he smoked a blunt someone gave him. He is not sure what was in it but ever since he smoked it he felt weird. Denies N/V/D. Denies pain. States he is really sweaty. 87/50

## 2024-03-22 NOTE — ED Notes (Signed)
 Called lab to have labs ordered added on.

## 2024-03-23 NOTE — ED Provider Notes (Signed)
 Patient signed out pending CT imaging and follow-up.  CT imaging negative.  In follow-up, patient easily arousable.  Ambulated at his baseline to the bathroom with nursing.  Reports that he has homeless.  Provided with housing resources.  Physical Exam  BP 119/67   Pulse 66   Temp (!) 97.5 F (36.4 C) (Oral)   Resp 13   Ht 1.651 m (5' 5)   Wt 56.7 kg   SpO2 97%   BMI 20.80 kg/m   Physical Exam  Procedures  Procedures Awake, alert, no acute distress ED Course / MDM   Clinical Course as of 03/23/24 0603  Mon Mar 23, 2024  0603 Patient awake and alert.  Now states that he is homeless.  Still states he feels different but ambulated to the bathroom.  Likely related to drug use.  Provided shelter resources. [CH]    Clinical Course User Index [CH] Marke Goodwyn, Charmaine FALCON, MD   Medical Decision Making Amount and/or Complexity of Data Reviewed Labs: ordered. Radiology: ordered.  Risk Prescription drug management.   Problem List Items Addressed This Visit   None Visit Diagnoses       Altered mental status, unspecified altered mental status type    -  Primary     Homeless                 Bari Charmaine FALCON, MD 03/23/24 [phone]

## 2024-03-23 NOTE — Discharge Instructions (Addendum)
 You were seen today for altered mental status.  This is likely related to drug use.  You should avoid illicit drug use.

## 2024-03-30 NOTE — Congregational Nurse Program (Signed)
  Dept: [phone]   Congregational Nurse Program Note  Date of Encounter: 02/27/2024  Resident referred to PCP for a visit for medication issues.   Past Medical History: Past Medical History:  Diagnosis Date   Alcohol abuse 11/23/2010   Allergic rhinitis 11/07/2010   Anxiety    Bradycardia    Cocaine abuse (HCC) 11/23/2010   Congenital fusion of cervical spine    c5-c6   DDD (degenerative disc disease), cervical    c6-c7   Depression    Diabetes mellitus without complication (HCC)    GERD (gastroesophageal reflux disease)    Tobacco abuse 11/23/2010    Encounter Details:

## 2024-06-05 ENCOUNTER — Ambulatory Visit (HOSPITAL_COMMUNITY)
Admission: EM | Admit: 2024-06-05 | Discharge: 2024-06-05 | Disposition: A | Discharge status: Other Acute Inpatient Hospital | Attending: Family Medicine | Admitting: Family Medicine

## 2024-06-05 DIAGNOSIS — F1910 Other psychoactive substance abuse, uncomplicated: Secondary | ICD-10-CM

## 2024-06-05 LAB — CBC WITH DIFFERENTIAL/PLATELET
Abs Immature Granulocytes: 0.01 K/uL (ref 0.00–0.07)
Basophils Absolute: 0.1 K/uL (ref 0.0–0.1)
Basophils Relative: 1 %
Eosinophils Absolute: 0.3 K/uL (ref 0.0–0.5)
Eosinophils Relative: 6 %
HCT: 40.9 % (ref 39.0–52.0)
Hemoglobin: 13.7 g/dL (ref 13.0–17.0)
Immature Granulocytes: 0 %
Lymphocytes Relative: 42 %
Lymphs Abs: 2.3 K/uL (ref 0.7–4.0)
MCH: 28.5 pg (ref 26.0–34.0)
MCHC: 33.5 g/dL (ref 30.0–36.0)
MCV: 85 fL (ref 80.0–100.0)
Monocytes Absolute: 0.5 K/uL (ref 0.1–1.0)
Monocytes Relative: 9 %
Neutro Abs: 2.3 K/uL (ref 1.7–7.7)
Neutrophils Relative %: 42 %
Platelets: 203 K/uL (ref 150–400)
RBC: 4.81 MIL/uL (ref 4.22–5.81)
RDW: 15.2 % (ref 11.5–15.5)
WBC: 5.5 K/uL (ref 4.0–10.5)
nRBC: 0 % (ref 0.0–0.2)

## 2024-06-05 LAB — URINALYSIS, ROUTINE W REFLEX MICROSCOPIC
Bacteria, UA: NONE SEEN
Bilirubin Urine: NEGATIVE
Glucose, UA: NEGATIVE mg/dL
Hgb urine dipstick: NEGATIVE
Ketones, ur: NEGATIVE mg/dL
Nitrite: NEGATIVE
Protein, ur: NEGATIVE mg/dL
Specific Gravity, Urine: 1.024 (ref 1.005–1.030)
pH: 6 (ref 5.0–8.0)

## 2024-06-05 LAB — COMPREHENSIVE METABOLIC PANEL WITH GFR
ALT: 14 U/L (ref 0–44)
AST: 17 U/L (ref 15–41)
Albumin: 3.9 g/dL (ref 3.5–5.0)
Alkaline Phosphatase: 59 U/L (ref 38–126)
Anion gap: 12 (ref 5–15)
BUN: 19 mg/dL (ref 6–20)
CO2: 28 mmol/L (ref 22–32)
Calcium: 9.1 mg/dL (ref 8.9–10.3)
Chloride: 102 mmol/L (ref 98–111)
Creatinine, Ser: 1 mg/dL (ref 0.61–1.24)
GFR, Estimated: >60 mL/min (ref >60)
Glucose, Bld: 80 mg/dL (ref 70–99)
Potassium: 3.6 mmol/L (ref 3.5–5.1)
Sodium: 142 mmol/L (ref 135–145)
Total Bilirubin: 0.2 mg/dL (ref 0.0–1.2)
Total Protein: 6.6 g/dL (ref 6.5–8.1)

## 2024-06-05 LAB — HEMOGLOBIN A1C
Hgb A1c MFr Bld: 6 % — ABNORMAL HIGH (ref 4.8–5.6)
Mean Plasma Glucose: 125.5 mg/dL

## 2024-06-05 LAB — POCT URINE DRUG SCREEN - MANUAL ENTRY (I-SCREEN)
POC Amphetamine UR: NOT DETECTED
POC Buprenorphine (BUP): NOT DETECTED
POC Cocaine UR: POSITIVE — AB
POC Marijuana UR: POSITIVE — AB
POC Methadone UR: NOT DETECTED
POC Methamphetamine UR: NOT DETECTED
POC Morphine: NOT DETECTED
POC Oxazepam (BZO): NOT DETECTED
POC Oxycodone UR: NOT DETECTED
POC Secobarbital (BAR): NOT DETECTED

## 2024-06-05 LAB — ETHANOL: Alcohol, Ethyl (B): <15 mg/dL (ref <15)

## 2024-06-05 LAB — MAGNESIUM: Magnesium: 2 mg/dL (ref 1.7–2.4)

## 2024-06-05 MED ORDER — OLANZAPINE 10 MG IM SOLR: 5.0000 mg | Freq: Three times a day (TID) | INTRAMUSCULAR | Status: DC | PRN

## 2024-06-05 MED ORDER — ACETAMINOPHEN 325 MG PO TABS: 650.0000 mg | ORAL_TABLET | Freq: Four times a day (QID) | ORAL | Status: DC | PRN

## 2024-06-05 MED ORDER — MAGNESIUM HYDROXIDE 400 MG/5ML PO SUSP: 30.0000 mL | Freq: Every day | ORAL | Status: DC | PRN

## 2024-06-05 MED ORDER — OLANZAPINE 5 MG PO TBDP: 5.0000 mg | ORAL_TABLET | Freq: Three times a day (TID) | ORAL | Status: DC | PRN

## 2024-06-05 MED ORDER — ALUM & MAG HYDROXIDE-SIMETH 200-200-20 MG/5ML PO SUSP: 30.0000 mL | ORAL | Status: DC | PRN

## 2024-06-05 NOTE — ED Provider Notes (Addendum)
 Behavioral Health Urgent Care Medical Screening Exam  Patient Name: William Wood MRN: [id number] Date of Evaluation: 06/05/24 Chief Complaint: To get help with drugs and mental health Diagnosis:  Final diagnoses:  Polysubstance abuse [facility])   Chart reviewed with attending psychiatrist, Dr Kandi Hahn.  History of Present illness: William Wood is a 58 y.o. male presenting to [facility] unaccompanied. Pt states that he was sent here from [facility] to receive a medical clearance for his ongoing substance use problem. PT states he used Cocaine today, roughly a gram and a half. Pt states that he has been struggling with his addiction for 18 months off and on. Pt does report he is using daily. Pt states he is using alcohol on occasions as well. Pt endorses slight HI thoughts towards anyone that bothers him. Pt states that he is also homeless and is needing a place to stay, stating that is why he is here. Pt denies Si, Hi and AVH currently.   Pt is seen face-to-face on the [facility] Adult treatment area. Pt is alert & oriented x 4 and engages in today's visit. Today, patient states that he came in today to get help with drugs and mental health.  States that he was released from [facility] 4 months ago after completing a 30-day stay.  Patient states that he had a return to use of substances 3 weeks after discharge from [facility].  He endorses using cocaine or I think it is cocaine and what ever else they mix it with.  He estimates his cocaine use is 1 to 1-1/2 g daily via smoking with last use being this morning.  Endorses the use of marijuana 3 blunts a day with last use this morning.  He endorses the use of alcohol as much as I can get and estimates that he drinks 3-4 mikes hard lemonade's most days of the week with last use this morning.  He denies methamphetamine.  He is unsure about heroin or fentanyl  as he thinks that may have been mixed in with his cocaine because this time I feel different.  He states that  cocaine causes him paranoia which leads to him experiencing bouts of anger.  States that he is beginning to experience symptoms of withdrawal that he describes as agitation, irritability and craving substances.  States that he has spoken with Rosaline at [facility] regarding residential program there.  States he was advised to come here for medical clearance.  He endorses passive suicidal ideation I think about it.  He denies plan or intent.  He endorses 1 previous suicide attempt years ago when I cut my wrist.  He denies AVH.  He denies homicidal ideation, intent or plan.  He does endorse that he wants to harm individuals when they distract me and take stuff from me.  States he has four brothers who have passed away from ETOH and substance use. Discussed admission to the facility based crisis [facility]) for treatment of polysubstance use.  Patient endorses that he is motivated for treatment stating that he would like to get back to working.  Patient is agreeable to signing in voluntarily for substance use treatment at this time.  Flowsheet Row ED from 06/05/2024 in [facility] ED from 03/02/2024 in [facility] Emergency Department at [facility] ED from 11/21/2023 in [facility] Emergency Department at [facility]  C-SSRS RISK CATEGORY No Risk No Risk No Risk    Psychiatric Specialty Exam  Presentation  General Appearance:Appropriate for  Environment  Eye Contact:Fair  Speech:Clear and Coherent  Speech Volume:Normal  Handedness:Right   Mood and Affect  Mood: Euthymic  Affect: Congruent   Thought Process  Thought Processes: Coherent  Descriptions of Associations:Intact  Orientation:Full (Time, Place and Person)  Thought Content:Logical    Hallucinations:None  Ideas of Reference:None  Suicidal Thoughts:No  Homicidal Thoughts:No   Sensorium  Memory: Immediate Fair; Recent Fair; Remote  Fair  Judgment: Fair  Insight: Fair   Art Therapist  Concentration: Fair  Attention Span: Fair  Recall: [employer] of Knowledge: Fair  Language: Fair   Psychomotor Activity  Psychomotor Activity: Normal   Assets  Assets: Manufacturing Systems Engineer; Desire for Improvement; Physical Health; Leisure Time   Sleep  Sleep: Fair  Number of hours: No data recorded  Physical Exam: Physical Exam Vitals and nursing note reviewed.  HENT:     Mouth/Throat:     Mouth: Mucous membranes are moist.  Cardiovascular:     Rate and Rhythm: Normal rate.  Pulmonary:     Effort: Pulmonary effort is normal.  Skin:    General: Skin is warm and dry.  Neurological:     Mental Status: He is alert and oriented to person, place, and time.  Psychiatric:     Comments: See HPI    Review of Systems  Constitutional:  Negative for chills and fever.  HENT:  Negative for congestion and sore throat.   Respiratory:  Negative for cough and shortness of breath.   Cardiovascular:  Negative for chest pain and palpitations.  Psychiatric/Behavioral:  Positive for depression, substance abuse and suicidal ideas.    Blood pressure 135/77, pulse 77, temperature 98.6 F (37 C), temperature source Oral, resp. rate 19, SpO2 99%. There is no height or weight on file to calculate BMI.  Musculoskeletal: Strength & Muscle Tone: within normal limits Gait & Station: normal Patient leans: N/A   [facility] MSE Discharge Disposition for Follow up and Recommendations: Based on my evaluation the patient does not appear to have an emergency medical condition   Admit to facility based crisis [facility]) when bed available.   Sherrell Culver, PMHNP-BC, FNP-BC  06/05/2024, 6:29 PM

## 2024-06-05 NOTE — ED Notes (Signed)
 Pt is currently in assessment room eating dinner. He is calm, cooperative and has a tearful affect. Q15 safety checks in place.

## 2024-06-05 NOTE — ED Notes (Signed)
 Patient to [facility] for substance use treatment and possible rehab. On arrival, patient A/OX4. MAE. NAD. Respirations even and unlabored. Denies SI/HI/AVH Oriented to the unit and unit rules. Cooperative and tolerated admission process. MHT offered dinner. Will monitor for safety,

## 2024-06-05 NOTE — ED Notes (Signed)
 Patient is alert and oriented x 4 with no acute distress noted.  Patient oriented to observation unit wearing scrubs.  He currently denies SI/HI/AVH at this time.  Patient has been given food and drink.  Skin assessment completed.  Will continue Q 15 min safety checks for safety/behavior per facility protocol.
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
## 2024-06-06 ENCOUNTER — Other Ambulatory Visit (HOSPITAL_COMMUNITY)
Admission: EM | Admit: 2024-06-06 | Discharge: 2024-06-11 | Disposition: A | Source: Intra-hospital | Attending: Family Medicine | Admitting: Family Medicine

## 2024-06-06 DIAGNOSIS — F332 Major depressive disorder, recurrent severe without psychotic features: Secondary | ICD-10-CM | POA: Diagnosis not present

## 2024-06-06 DIAGNOSIS — F1910 Other psychoactive substance abuse, uncomplicated: Secondary | ICD-10-CM | POA: Diagnosis present

## 2024-06-06 DIAGNOSIS — F1410 Cocaine abuse, uncomplicated: Secondary | ICD-10-CM | POA: Diagnosis not present

## 2024-06-06 DIAGNOSIS — Z9151 Personal history of suicidal behavior: Secondary | ICD-10-CM | POA: Diagnosis not present

## 2024-06-06 DIAGNOSIS — Z5900 Homelessness unspecified: Secondary | ICD-10-CM | POA: Diagnosis not present

## 2024-06-06 DIAGNOSIS — F411 Generalized anxiety disorder: Secondary | ICD-10-CM | POA: Diagnosis not present

## 2024-06-06 DIAGNOSIS — F1994 Other psychoactive substance use, unspecified with psychoactive substance-induced mood disorder: Secondary | ICD-10-CM

## 2024-06-06 DIAGNOSIS — F1290 Cannabis use, unspecified, uncomplicated: Secondary | ICD-10-CM | POA: Diagnosis not present

## 2024-06-06 DIAGNOSIS — Z9152 Personal history of nonsuicidal self-harm: Secondary | ICD-10-CM | POA: Diagnosis not present

## 2024-06-06 DIAGNOSIS — R45851 Suicidal ideations: Secondary | ICD-10-CM | POA: Diagnosis not present

## 2024-06-06 DIAGNOSIS — F1090 Alcohol use, unspecified, uncomplicated: Secondary | ICD-10-CM | POA: Diagnosis not present

## 2024-06-06 MED ORDER — ALUM & MAG HYDROXIDE-SIMETH 200-200-20 MG/5ML PO SUSP
30.0000 mL | ORAL | Status: DC | PRN
  Administered 2024-06-09: 30 mL via ORAL
  Filled 2024-06-06: qty 30

## 2024-06-06 MED ORDER — ACETAMINOPHEN 325 MG PO TABS: 650.0000 mg | ORAL_TABLET | Freq: Four times a day (QID) | ORAL | Status: DC | PRN

## 2024-06-06 MED ORDER — OLANZAPINE 10 MG IM SOLR: 5.0000 mg | Freq: Three times a day (TID) | INTRAMUSCULAR | Status: DC | PRN

## 2024-06-06 MED ORDER — MAGNESIUM HYDROXIDE 400 MG/5ML PO SUSP: 30.0000 mL | Freq: Every day | ORAL | Status: DC | PRN

## 2024-06-06 MED ORDER — SERTRALINE HCL 25 MG PO TABS
25.0000 mg | ORAL_TABLET | Freq: Every day | ORAL | Status: DC
  Administered 2024-06-06 – 2024-06-07 (×2): 25 mg via ORAL
  Filled 2024-06-06 (×2): qty 1

## 2024-06-06 MED ORDER — OLANZAPINE 5 MG PO TBDP: 5.0000 mg | ORAL_TABLET | Freq: Three times a day (TID) | ORAL | Status: DC | PRN

## 2024-06-06 MED ORDER — TRAZODONE HCL 50 MG PO TABS
50.0000 mg | ORAL_TABLET | Freq: Every evening | ORAL | Status: DC | PRN
  Filled 2024-06-06: qty 7
  Filled 2024-06-06: qty 1
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
## 2024-06-06 NOTE — ED Notes (Signed)
 Patient asleep in the bedroom. NAD, will keep monitoring for safety.

## 2024-06-06 NOTE — ED Notes (Signed)
 Patient was admitted and admission done but some minutes later, [facility] d/c patient from the system because pt was not initially registered and admitted.

## 2024-06-06 NOTE — ED Provider Notes (Addendum)
 Facility Based Crisis Admission H&P  Date: 06/06/24 Patient Name: William Wood MRN: [id number] Chief Complaint: William Wood reports   I need detox for cocaine and alcohol.  Diagnoses:  Final diagnoses:  Substance induced mood disorder (HCC)  Polysubstance abuse (HCC)    HPI:  William Wood 58 year old admitted to Facility Based Crisis this seeking detox from alcohol, cocaine and marijuana use.  Reports he is looking forward to residential treatment with [facility] and/or [facility] 6 months or longer.  Patient validates the information provided and below admission assessment.  No concerns related to suicidal or homicidal ideations.  Denies auditory or visual hallucinations.  Denied that he is currently employed.  History of housing instability.  States his last admission to [facility] was about 6 months prior.  William Wood is resting in bed; he is alert/oriented x 4; calm/cooperative; and mood congruent with affect.  Patient is speaking in a clear tone at moderate volume, and normal pace; with good eye contact. His thought process is coherent and relevant; There is no indication that he is currently responding to internal/external stimuli or experiencing delusional thought content.  Patient denies suicidal/self-harm/homicidal ideation, psychosis, and paranoia.  Patient has remained calm throughout assessment and has answered questions appropriately and   Per initial admission assessment note:  Pt states that he was sent here from [facility] to receive a medical clearance for his ongoing substance use problem. PT states he used Cocaine today, roughly a gram and a half. Pt states that he has been struggling with his addiction for 18 months off and on. Pt does report he is using daily. Pt states he is using alcohol on occasions as well. Pt endorses slight HI thoughts towards anyone that bothers him. Pt states that he is also homeless and is needing a place to stay, stating that is why he is here. Pt denies Si,  Hi and AVH currently., patient states that he came in today to get help with drugs and mental health.  States that he was released from [facility] 4 months ago after completing a 30-day stay.  Patient states that he had a return to use of substances 3 weeks after discharge from [facility].  He endorses using cocaine or I think it is cocaine and what ever else they mix it with.  He estimates his cocaine use is 1 to 1-1/2 g daily via smoking with last use being this morning.  Endorses the use of marijuana 3 blunts a day with last use this morning.  He endorses the use of alcohol as much as I can get and estimates that he drinks 3-4 mikes hard lemonade's most days of the week with last use this morning.  He denies methamphetamine.  He is unsure about heroin or fentanyl  as he thinks that may have been mixed in with his cocaine because this time I feel different.  He states that cocaine causes him paranoia which leads to him experiencing bouts of anger.  States that he is beginning to experience symptoms of withdrawal that he describes as agitation, irritability and craving substances.  States that he has spoken with Rosaline at [facility] regarding residential program there.  States he was advised to come here for medical clearance.  He endorses passive suicidal ideation I think about it.  He denies plan or intent.  He endorses 1 previous suicide attempt years ago when I cut my wrist.  He denies AVH.  He denies homicidal ideation, intent or plan.  He does endorse that he wants  to harm individuals when they distract me and take stuff from me.  States he has four brothers who have passed away from ETOH and substance use.      PHQ 2-9:   Flowsheet Row ED from 06/05/2024 in [facility] ED from 03/02/2024 in [facility] Department at [facility] ED from 11/21/2023 in [facility] Emergency Department at [facility]  C-SSRS RISK CATEGORY No Risk No Risk No Risk       Total Time spent with patient: 15 minutes  Musculoskeletal  Strength & Muscle Tone: within normal limits Gait & Station: normal Patient leans: N/A  Psychiatric Specialty Exam  Presentation General Appearance:  Appropriate for Environment  Eye Contact: Fair  Speech: Clear and Coherent  Speech Volume: Normal  Handedness: Right   Mood and Affect  Mood: Euthymic  Affect: Congruent   Thought Process  Thought Processes: Coherent  Descriptions of Associations:Intact  Orientation:Full (Time, Place and Person)  Thought Content:Logical    Hallucinations:No data recorded Ideas of Reference:None  Suicidal Thoughts:No data recorded Homicidal Thoughts:No data recorded  Sensorium  Memory: Immediate Fair; Recent Fair; Remote Fair  Judgment: Fair  Insight: Fair   Chartered Certified Accountant: Fair  Attention Span: Fair  Recall: [employer] of Knowledge: Fair  Language: Fair   Psychomotor Activity  Psychomotor Activity:No data recorded  Assets  Assets: Communication Skills; Desire for Improvement; Physical Health; Leisure Time   Sleep  Sleep:No data recorded  No data recorded  Physical Exam Vitals and nursing note reviewed.  Cardiovascular:     Rate and Rhythm: Normal rate.     Pulses: Normal pulses.     Heart sounds: Normal heart sounds.  Neurological:     Mental Status: He is oriented to person, place, and time.  Psychiatric:        Mood and Affect: Mood normal.        Thought Content: Thought content normal.    ROS  Blood pressure 137/81, pulse (!) 45, temperature 97.6 F (36.4 C), temperature source Oral, resp. rate 16, SpO2 97%. There is no height or weight on file to calculate BMI.  Past Psychiatric History: Depression, anxiety substance-induced mood disorder.  Alcohol abuse  and cocaine abuse  Is the patient at risk to self? No  Has the patient been a risk to self in the past 6 months? No .    Has the  patient been a risk to self within the distant past? No   Is the patient a risk to others? No   Has the patient been a risk to others in the past 6 months? No   Has the patient been a risk to others within the distant past? 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  History of bradycardia Family History:  Social History:   Last Labs:  Admission on 06/05/2024, Discharged on 06/05/2024  Component Date Value Ref Range Status   WBC 06/05/2024 5.5  4.0 - 10.5 K/uL Final   RBC 06/05/2024 4.81  4.22 - 5.81 MIL/uL Final   Hemoglobin 06/05/2024 13.7  13.0 - 17.0 g/dL Final   HCT 87/94/7974 40.9  39.0 - 52.0 % Final   MCV 06/05/2024 85.0  80.0 - 100.0 fL Final   MCH 06/05/2024 28.5  26.0 - 34.0 pg Final   MCHC 06/05/2024 33.5  30.0 - 36.0 g/dL Final   RDW 87/94/7974 15.2  11.5 -  15.5 % Final   Platelets 06/05/2024 203  150 - 400 K/uL Final   nRBC 06/05/2024 0.0  0.0 - 0.2 % Final   Neutrophils Relative % 06/05/2024 42  % Final   Neutro Abs 06/05/2024 2.3  1.7 - 7.7 K/uL Final   Lymphocytes Relative 06/05/2024 42  % Final   Lymphs Abs 06/05/2024 2.3  0.7 - 4.0 K/uL Final   Monocytes Relative 06/05/2024 9  % Final   Monocytes Absolute 06/05/2024 0.5  0.1 - 1.0 K/uL Final   Eosinophils Relative 06/05/2024 6  % Final   Eosinophils Absolute 06/05/2024 0.3  0.0 - 0.5 K/uL Final   Basophils Relative 06/05/2024 1  % Final   Basophils Absolute 06/05/2024 0.1  0.0 - 0.1 K/uL Final   Immature Granulocytes 06/05/2024 0  % Final   Abs Immature Granulocytes 06/05/2024 0.01  0.00 - 0.07 K/uL Final   Performed at [facility], [address] [place], [place] [address]   Sodium 06/05/2024 142  135 - 145 mmol/L Final   Potassium 06/05/2024 3.6  3.5 - 5.1 mmol/L Final   Chloride 06/05/2024 102  98 - 111 mmol/L Final   CO2 06/05/2024 28  22 - 32 mmol/L Final   Glucose, Bld 06/05/2024 80  70 - 99 mg/dL Final   Glucose reference range  applies only to samples taken after fasting for at least 8 hours.   BUN 06/05/2024 19  6 - 20 mg/dL Final   Creatinine, Ser 06/05/2024 1.00  0.61 - 1.24 mg/dL Final   Calcium 87/94/7974 9.1  8.9 - 10.3 mg/dL Final   Total Protein 87/94/7974 6.6  6.5 - 8.1 g/dL Final   Albumin 87/94/7974 3.9  3.5 - 5.0 g/dL Final   AST 87/94/7974 17  15 - 41 U/L Final   ALT 06/05/2024 14  0 - 44 U/L Final   Alkaline Phosphatase 06/05/2024 59  38 - 126 U/L Final   Total Bilirubin 06/05/2024 0.2  0.0 - 1.2 mg/dL Final   GFR, Estimated 06/05/2024 >60  >60 mL/min Final   Comment: (NOTE) Calculated using the CKD-EPI Creatinine Equation (2021)    Anion gap 06/05/2024 12  5 - 15 Final   Performed at [facility], [address], [place], [place] [address]   Hgb A1c MFr Bld 06/05/2024 6.0 (H)  4.8 - 5.6 % Final   Comment: (NOTE) Diagnosis of Diabetes The following HbA1c ranges recommended by the American Diabetes Association (ADA) may be used as an aid in the diagnosis of diabetes mellitus.  Hemoglobin             Suggested A1C NGSP%              Diagnosis  <5.7                   Non Diabetic  5.7-6.4                Pre-Diabetic  >6.4                   Diabetic  <7.0                   Glycemic control for                       adults with diabetes.     Mean Plasma Glucose 06/05/2024 125.5  mg/dL Final   Performed at [facility], [address] [place], [place] [address]   Magnesium  06/05/2024 2.0  1.7 - 2.4 mg/dL Final   Performed at [facility], [address] [place], [place] [address]   Alcohol, Ethyl (B) 06/05/2024 <15  <15 mg/dL Final   Comment: (NOTE) For medical purposes only. Performed at [facility], [address] [place], [place] [address]    Color, Urine 06/05/2024 YELLOW  YELLOW Final   APPearance 06/05/2024 CLEAR  CLEAR Final   Specific Gravity, Urine 06/05/2024 1.024  1.005 - 1.030 Final   pH 06/05/2024 6.0  5.0 - 8.0 Final   Glucose, UA 06/05/2024  NEGATIVE  NEGATIVE mg/dL Final   Hgb urine dipstick 06/05/2024 NEGATIVE  NEGATIVE Final   Bilirubin Urine 06/05/2024 NEGATIVE  NEGATIVE Final   Ketones, ur 06/05/2024 NEGATIVE  NEGATIVE mg/dL Final   Protein, ur 87/94/7974 NEGATIVE  NEGATIVE mg/dL Final   Nitrite 87/94/7974 NEGATIVE  NEGATIVE Final   Leukocytes,Ua 06/05/2024 LARGE (A)  NEGATIVE Final   RBC / HPF 06/05/2024 0-5  0 - 5 RBC/hpf Final   WBC, UA 06/05/2024 0-5  0 - 5 WBC/hpf Final   Bacteria, UA 06/05/2024 NONE SEEN  NONE SEEN Final   Squamous Epithelial / HPF 06/05/2024 0-5  0 - 5 /HPF Final   Mucus 06/05/2024 PRESENT   Final   Performed at [facility], [address], [place], [place] [address]   POC Amphetamine UR 06/05/2024 None Detected  NONE DETECTED (Cut Off Level 1000 ng/mL) Final   POC Secobarbital (BAR) 06/05/2024 None Detected  NONE DETECTED (Cut Off Level 300 ng/mL) Final   POC Buprenorphine (BUP) 06/05/2024 None Detected  NONE DETECTED (Cut Off Level 10 ng/mL) Final   POC Oxazepam (BZO) 06/05/2024 None Detected  NONE DETECTED (Cut Off Level 300 ng/mL) Final   POC Cocaine UR 06/05/2024 Positive (A)  NONE DETECTED (Cut Off Level 300 ng/mL) Final   POC Methamphetamine UR 06/05/2024 None Detected  NONE DETECTED (Cut Off Level 1000 ng/mL) Final   POC Morphine 06/05/2024 None Detected  NONE DETECTED (Cut Off Level 300 ng/mL) Final   POC Methadone UR 06/05/2024 None Detected  NONE DETECTED (Cut Off Level 300 ng/mL) Final   POC Oxycodone UR 06/05/2024 None Detected  NONE DETECTED (Cut Off Level 100 ng/mL) Final   POC Marijuana UR 06/05/2024 Positive (A)  NONE DETECTED (Cut Off Level 50 ng/mL) Final  Admission on 03/22/2024, Discharged on 03/23/2024  Component Date Value Ref Range Status   Sodium 03/22/2024 145  135 - 145 mmol/L Final   Potassium 03/22/2024 3.2 (L)  3.5 - 5.1 mmol/L Final   Chloride 03/22/2024 109  98 - 111 mmol/L Final   CO2 03/22/2024 21 (L)  22 - 32 mmol/L Final   Glucose, Bld 03/22/2024 120 (H)   70 - 99 mg/dL Final   Glucose reference range applies only to samples taken after fasting for at least 8 hours.   BUN 03/22/2024 14  6 - 20 mg/dL Final   Creatinine, Ser 03/22/2024 1.06  0.61 - 1.24 mg/dL Final   Calcium 90/78/7974 8.3 (L)  8.9 - 10.3 mg/dL Final   Total Protein 90/78/7974 5.9 (L)  6.5 - 8.1 g/dL Final   Albumin 90/78/7974 3.9  3.5 - 5.0 g/dL Final   AST 90/78/7974 24  15 - 41 U/L Final   ALT 03/22/2024 17  0 - 44 U/L Final   Alkaline Phosphatase 03/22/2024 62  38 - 126 U/L Final   Total Bilirubin  03/22/2024 0.4  0.0 - 1.2 mg/dL Final   GFR, Estimated 03/22/2024 >60  >60 mL/min Final   Comment: (NOTE) Calculated using the CKD-EPI Creatinine Equation (2021)    Anion gap 03/22/2024 15  5 - 15 Final   Performed at [facility], [address] [place], [place] [address]   Alcohol, Ethyl (B) 03/22/2024 <15  <15 mg/dL Final   Comment: (NOTE) For medical purposes only. Performed at [facility], [address], [place], [place] [address]    Lipase 03/22/2024 18  11 - 51 U/L Final   Performed at [facility], [address], [place], [place] [address]   Troponin T High Sensitivity 03/22/2024 <15  0 - 19 ng/L Final   Comment: (NOTE) Biotin concentrations > 1000 ng/mL falsely decrease TnT results.  Serial cardiac troponin measurements are suggested.  Refer to the Links section for chest pain algorithms and additional  guidance. Performed at [facility], [address], [place], [place] [address]    WBC 03/22/2024 5.8  4.0 - 10.5 K/uL Final   RBC 03/22/2024 4.11 (L)  4.22 - 5.81 MIL/uL Final   Hemoglobin 03/22/2024 11.4 (L)  13.0 - 17.0 g/dL Final   HCT 90/78/7974 36.4 (L)  39.0 - 52.0 % Final   MCV 03/22/2024 88.6  80.0 - 100.0 fL Final   MCH 03/22/2024 27.7  26.0 - 34.0 pg Final   MCHC 03/22/2024 31.3  30.0 - 36.0 g/dL Final   RDW 90/78/7974 15.9 (H)  11.5 - 15.5 % Final   Platelets  03/22/2024 248  150 - 400 K/uL Final   nRBC 03/22/2024 0.0  0.0 - 0.2 % Final   Neutrophils Relative % 03/22/2024 66  % Final   Neutro Abs 03/22/2024 3.9  1.7 - 7.7 K/uL Final   Lymphocytes Relative 03/22/2024 23  % Final   Lymphs Abs 03/22/2024 1.3  0.7 - 4.0 K/uL Final   Monocytes Relative 03/22/2024 7  % Final   Monocytes Absolute 03/22/2024 0.4  0.1 - 1.0 K/uL Final   Eosinophils Relative 03/22/2024 3  % Final   Eosinophils Absolute 03/22/2024 0.2  0.0 - 0.5 K/uL Final   Basophils Relative 03/22/2024 1  % Final   Basophils Absolute 03/22/2024 0.0  0.0 - 0.1 K/uL Final   Immature Granulocytes 03/22/2024 0  % Final   Abs Immature Granulocytes 03/22/2024 0.01  0.00 - 0.07 K/uL Final   Performed at [facility], [address], [place], [place] [address]   Color, Urine 03/22/2024 YELLOW  YELLOW Final   APPearance 03/22/2024 CLEAR  CLEAR Final   Specific Gravity, Urine 03/22/2024 1.019  1.005 - 1.030 Final   pH 03/22/2024 5.0  5.0 - 8.0 Final   Glucose, UA 03/22/2024 NEGATIVE  NEGATIVE mg/dL Final   Hgb urine dipstick 03/22/2024 NEGATIVE  NEGATIVE Final   Bilirubin Urine 03/22/2024 NEGATIVE  NEGATIVE Final   Ketones, ur 03/22/2024 5 (A)  NEGATIVE mg/dL Final   Protein, ur 90/78/7974 30 (A)  NEGATIVE mg/dL Final   Nitrite 90/78/7974 NEGATIVE  NEGATIVE Final   Leukocytes,Ua 03/22/2024 NEGATIVE  NEGATIVE Final   RBC / HPF 03/22/2024 0-5  0 - 5 RBC/hpf Final   WBC, UA 03/22/2024 0-5  0 - 5 WBC/hpf Final   Bacteria, UA 03/22/2024 NONE SEEN  NONE SEEN Final   Squamous Epithelial / HPF 03/22/2024 0-5  0 - 5 /HPF Final   Mucus 03/22/2024 PRESENT   Final  Hyaline Casts, UA 03/22/2024 PRESENT   Final   Sperm, UA 03/22/2024 PRESENT   Final   Performed at [facility], [address], [place], [place] [address]   Opiates 03/22/2024 NEGATIVE  NEGATIVE Final   Cocaine 03/22/2024 POSITIVE (A)  NEGATIVE Final   Benzodiazepines 03/22/2024 NEGATIVE  NEGATIVE  Final   Amphetamines 03/22/2024 NEGATIVE  NEGATIVE Final   Tetrahydrocannabinol 03/22/2024 POSITIVE (A)  NEGATIVE Final   Barbiturates 03/22/2024 NEGATIVE  NEGATIVE Final   Methadone Scn, Ur 03/22/2024 NEGATIVE  NEGATIVE Final   Fentanyl  03/22/2024 NEGATIVE  NEGATIVE Final   Comment: (NOTE) Drug screen is for Medical Purposes only. Positive results are preliminary only. If confirmation is needed, notify lab within 5 days.  Drug Class                 Cutoff (ng/mL) Amphetamine and metabolites 1000 Barbiturate and metabolites 200 Benzodiazepine              200 Opiates and metabolites     300 Cocaine and metabolites     300 THC                         50 Fentanyl                     5 Methadone                   300  Trazodone  is metabolized in vivo to several metabolites,  including pharmacologically active m-CPP, which is excreted in the  urine.  Immunoassay screens for amphetamines and MDMA have potential  cross-reactivity with these compounds and may provide false positive  result.  Performed at [facility], [address] [place], [place] [address]    Troponin T High Sensitivity 03/22/2024 <15  0 - 19 ng/L Final   Comment: HEMOLYZED SPECIMEN - SUGGEST RECOLLECT (NOTE) Biotin concentrations > 1000 ng/mL falsely decrease TnT results.  Serial cardiac troponin measurements are suggested.  Refer to the Links section for chest pain algorithms and additional  guidance. Performed at [facility], [address], [place], [place] [address]   Admission on 03/02/2024, Discharged on 03/02/2024  Component Date Value Ref Range Status   WBC 03/02/2024 7.6  4.0 - 10.5 K/uL Final   RBC 03/02/2024 4.86  4.22 - 5.81 MIL/uL Final   Hemoglobin 03/02/2024 13.7  13.0 - 17.0 g/dL Final   HCT 90/98/7974 41.0  39.0 - 52.0 % Final   MCV 03/02/2024 84.4  80.0 - 100.0 fL Final   MCH 03/02/2024 28.2  26.0 - 34.0 pg Final   MCHC 03/02/2024 33.4  30.0 -  36.0 g/dL Final   RDW 90/98/7974 14.9  11.5 - 15.5 % Final   Platelets 03/02/2024 207  150 - 400 K/uL Final   nRBC 03/02/2024 0.0  0.0 - 0.2 % Final   Neutrophils Relative % 03/02/2024 62  % Final   Neutro Abs 03/02/2024 4.7  1.7 - 7.7 K/uL Final   Lymphocytes Relative 03/02/2024 24  % Final   Lymphs Abs 03/02/2024 1.8  0.7 - 4.0 K/uL Final   Monocytes Relative 03/02/2024 10  % Final   Monocytes Absolute 03/02/2024 0.8  0.1 - 1.0 K/uL Final   Eosinophils Relative 03/02/2024 3  % Final   Eosinophils Absolute 03/02/2024 0.2  0.0 - 0.5 K/uL Final   Basophils Relative 03/02/2024 1  % Final   Basophils Absolute  03/02/2024 0.1  0.0 - 0.1 K/uL Final   Immature Granulocytes 03/02/2024 0  % Final   Abs Immature Granulocytes 03/02/2024 0.01  0.00 - 0.07 K/uL Final   Performed at [facility], [address], [place], [place] [address]   Sodium 03/02/2024 140  135 - 145 mmol/L Final   Potassium 03/02/2024 3.1 (L)  3.5 - 5.1 mmol/L Final   Chloride 03/02/2024 102  98 - 111 mmol/L Final   BUN 03/02/2024 15  6 - 20 mg/dL Final   Creatinine, Ser 03/02/2024 0.90  0.61 - 1.24 mg/dL Final   Glucose, Bld 90/98/7974 116 (H)  70 - 99 mg/dL Final   Glucose reference range applies only to samples taken after fasting for at least 8 hours.   Calcium, Ion 03/02/2024 1.17  1.15 - 1.40 mmol/L Final   TCO2 03/02/2024 24  22 - 32 mmol/L Final   Hemoglobin 03/02/2024 15.3  13.0 - 17.0 g/dL Final   HCT 90/98/7974 45.0  39.0 - 52.0 % Final   Color, Urine 03/02/2024 YELLOW  YELLOW Final   APPearance 03/02/2024 HAZY (A)  CLEAR Final   Specific Gravity, Urine 03/02/2024 1.026  1.005 - 1.030 Final   pH 03/02/2024 5.0  5.0 - 8.0 Final   Glucose, UA 03/02/2024 NEGATIVE  NEGATIVE mg/dL Final   Hgb urine dipstick 03/02/2024 NEGATIVE  NEGATIVE Final   Bilirubin Urine 03/02/2024 NEGATIVE  NEGATIVE Final   Ketones, ur 03/02/2024 5 (A)  NEGATIVE mg/dL Final   Protein, ur 90/98/7974 NEGATIVE  NEGATIVE mg/dL Final    Nitrite 90/98/7974 NEGATIVE  NEGATIVE Final   Leukocytes,Ua 03/02/2024 NEGATIVE  NEGATIVE Final   Performed at [facility], [address], [place], [place] [address]   RPR Ser Ql 03/02/2024 NON REACTIVE  NON REACTIVE Final   Performed at [facility], [address], [place], [place] [address]   Neisseria Gonorrhea 03/02/2024 Negative   Final   Chlamydia 03/02/2024 Negative   Final   Comment 03/02/2024 Normal Reference Ranger Chlamydia - Negative   Final   Comment 03/02/2024 Normal Reference Range Neisseria Gonorrhea - Negative   Final    Allergies: Patient has no known allergies.  Medications:  Facility Ordered Medications  Medication   acetaminophen  (TYLENOL ) tablet 650 mg   alum & mag hydroxide-simeth (MAALOX/MYLANTA) 200-200-20 MG/5ML suspension 30 mL   magnesium  hydroxide (MILK OF MAGNESIA) suspension 30 mL   OLANZapine  zydis (ZYPREXA ) disintegrating tablet 5 mg   OLANZapine  (ZYPREXA ) injection 5 mg   traZODone  (DESYREL ) tablet 50 mg    Long Term Goals: Improvement in symptoms so as ready for discharge  Short Term Goals: Patient will verbalize feelings in meetings with treatment team members., Patient will attend at least of 50% of the groups daily., and Pt will complete the PHQ9 on admission, day 3 and discharge.  Medical Decision Making  Facility based crisis -Orders placed for detox protocol -Patient to start Zoloft  25 mg daily for mood stabilization -CSW to follow-up with discharge disposition -Patient encouraged to participate in therapeutic milieu    Recommendations  Based on my evaluation the patient does not appear to have an emergency medical condition.  Staci LOISE Kerns, NP 06/06/24  1:38 PM
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
## 2024-06-06 NOTE — ED Notes (Signed)
 Assumed care of patient this am, he was resting quietly in bed, his affect flat, seemed somewhat preoccupied, denied self harm thoughts. Pt reported he is feeling much better than prior to hospital stay and denied withdrawal sx. Pt pleasant and apprehensive to taking prescribed Zoloft . Pt requested information on Zoloft  and wants to discuss with his doctor.

## 2024-06-07 DIAGNOSIS — F411 Generalized anxiety disorder: Secondary | ICD-10-CM | POA: Diagnosis not present

## 2024-06-07 DIAGNOSIS — F1994 Other psychoactive substance use, unspecified with psychoactive substance-induced mood disorder: Secondary | ICD-10-CM | POA: Diagnosis not present

## 2024-06-07 DIAGNOSIS — F1410 Cocaine abuse, uncomplicated: Secondary | ICD-10-CM | POA: Diagnosis not present

## 2024-06-07 DIAGNOSIS — F1290 Cannabis use, unspecified, uncomplicated: Secondary | ICD-10-CM | POA: Diagnosis not present

## 2024-06-07 MED ORDER — SERTRALINE HCL 50 MG PO TABS
50.0000 mg | ORAL_TABLET | Freq: Every day | ORAL | Status: DC
  Administered 2024-06-08 – 2024-06-10 (×3): 50 mg via ORAL
  Filled 2024-06-07: qty 1
  Filled 2024-06-07: qty 7
  Filled 2024-06-07 (×2): qty 1

## 2024-06-07 MED ORDER — METFORMIN HCL 500 MG PO TABS: 500.0000 mg | ORAL_TABLET | Freq: Once | ORAL | Status: DC

## 2024-06-07 NOTE — ED Notes (Signed)
 Paitent provided dinner.

## 2024-06-07 NOTE — ED Notes (Signed)
 Paitent provided breakfast.

## 2024-06-07 NOTE — ED Notes (Signed)
 Paitent provided lunch.

## 2024-06-07 NOTE — ED Notes (Signed)
 Patient asleep in the bedroom. No issues through the night.  will keep monitoring for safety.

## 2024-06-07 NOTE — ED Notes (Signed)
 Pt sitting in dayroom eating breakfast, watching television and interacting with peers. No acute distress noted. No concerns voiced. Informed pt to notify staff with any needs or assistance. Pt verbalized understanding and agreement. Will continue to monitor for safety.

## 2024-06-07 NOTE — ED Notes (Signed)
 Patient is in the dayroom at the moment participating in Groups.  NAD, Denies needing anything.Stated feels safe and his focus is on being discharged Tuesday to [facility].  Will continue to monitor and report changes a noted. Safety maintained
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
## 2024-06-07 NOTE — ED Notes (Signed)
 Patient is in the bedroom calm and composed.  Denies SI/AVH but slightly HI. Respirations even and unlabored. Environment secured per policy.  Will monitor for safety.

## 2024-06-07 NOTE — ED Provider Notes (Signed)
 Behavioral Health Progress Note  Date and Time: 06/07/2024 12:13 PM Name: William Wood MRN:  [id number]  Subjective:  William Wood stated  I need more food, this is not breakfast.   Evaluation: William Wood seen and evaluated face-to-face by this provider.  Presents slightly irritable related to unit limitations related to food.  No concerns related to suicidal or homicidal ideations during this assessment.  Denies auditory visual hallucinations.  He was recently initiated on Zoloft  25 mg for mood stabilization.  He reports he did take first dose and has tolerated it well.  Will increase Zoloft  25 to 50 mg daily.  PHQ-9 completed 11.  William Wood  reports he is eager to follow-up with case management for additional outpatient resources.  Tentative follow-up with [facility] for residential treatment.  Reports fair appetite.  States he is resting well throughout the night.  HPI:per admission assessment note: William Wood was admitted due to substance induced mood disorder and depressive symptoms.  Reports taking treatment related to cocaine, marijuana and occasional alcohol use.patient states that he came in today to get help with drugs and mental health. States that he was released from [facility] 4 months ago after completing a 30-day stay. Patient states that he had a return to use of substances 3 weeks after discharge from [facility]. He endorses using cocaine or I think it is cocaine and what ever else they mix it with. He estimates his cocaine use is 1 to 1-1/2 g daily via smoking with last use being this morning. Endorses the use of marijuana 3 blunts a day with last use this morning. He endorses the use of alcohol as much as I can get and estimates that he drinks 3-4 mikes hard lemonade's most days of the week with last use this morning. He denies methamphetamine. He is unsure about heroin or fentanyl  as he thinks that may have been mixed in with his cocaine because this time I feel different. He states that cocaine  causes him paranoia which leads to him experiencing bouts of anger.   Diagnosis:  Final diagnoses:  Substance induced mood disorder (HCC)  Polysubstance abuse (HCC)    Total Time spent with patient: 15 minutes  Past Psychiatric History: Alcohol abuse disorder, polysubstance abuse.  Previous inpatient admissions has attended residential treatment facility in the past 6 months. Past Medical History: Back pain Family History: N/A Family Psychiatric  History: N/A Social History: Unemployed, housing instability  Additional Social History:                         Sleep: Fair  Appetite:  Good  Current Medications:  Current Facility-Administered Medications  Medication Dose Route Frequency Provider Last Rate Last Admin   acetaminophen  (TYLENOL ) tablet 650 mg  650 mg Oral Q6H PRN Hobson, Fran E, NP       alum & mag hydroxide-simeth (MAALOX/MYLANTA) 200-200-20 MG/5ML suspension 30 mL  30 mL Oral Q4H PRN Hobson, Fran E, NP       magnesium  hydroxide (MILK OF MAGNESIA) suspension 30 mL  30 mL Oral Daily PRN Hobson, Fran E, NP       OLANZapine  (ZYPREXA ) injection 5 mg  5 mg Intramuscular TID PRN Hobson, Fran E, NP       OLANZapine  zydis (ZYPREXA ) disintegrating tablet 5 mg  5 mg Oral TID PRN Hobson, Fran E, NP       [START ON 06/08/2024] sertraline  (ZOLOFT ) tablet 50 mg  50 mg Oral Daily Ezzard Staci SAILOR, NP  traZODone  (DESYREL ) tablet 50 mg  50 mg Oral QHS PRN Hobson, Fran E, NP       No current outpatient medications on file.    Labs  Lab Results:  Admission on 06/05/2024, Discharged on 06/05/2024  Component Date Value Ref Range Status   WBC 06/05/2024 5.5  4.0 - 10.5 K/uL Final   RBC 06/05/2024 4.81  4.22 - 5.81 MIL/uL Final   Hemoglobin 06/05/2024 13.7  13.0 - 17.0 g/dL Final   HCT 87/94/7974 40.9  39.0 - 52.0 % Final   MCV 06/05/2024 85.0  80.0 - 100.0 fL Final   MCH 06/05/2024 28.5  26.0 - 34.0 pg Final   MCHC 06/05/2024 33.5  30.0 - 36.0 g/dL Final   RDW  87/94/7974 15.2  11.5 - 15.5 % Final   Platelets 06/05/2024 203  150 - 400 K/uL Final   nRBC 06/05/2024 0.0  0.0 - 0.2 % Final   Neutrophils Relative % 06/05/2024 42  % Final   Neutro Abs 06/05/2024 2.3  1.7 - 7.7 K/uL Final   Lymphocytes Relative 06/05/2024 42  % Final   Lymphs Abs 06/05/2024 2.3  0.7 - 4.0 K/uL Final   Monocytes Relative 06/05/2024 9  % Final   Monocytes Absolute 06/05/2024 0.5  0.1 - 1.0 K/uL Final   Eosinophils Relative 06/05/2024 6  % Final   Eosinophils Absolute 06/05/2024 0.3  0.0 - 0.5 K/uL Final   Basophils Relative 06/05/2024 1  % Final   Basophils Absolute 06/05/2024 0.1  0.0 - 0.1 K/uL Final   Immature Granulocytes 06/05/2024 0  % Final   Abs Immature Granulocytes 06/05/2024 0.01  0.00 - 0.07 K/uL Final   Performed at [facility], [address], [place], [place] [address]   Sodium 06/05/2024 142  135 - 145 mmol/L Final   Potassium 06/05/2024 3.6  3.5 - 5.1 mmol/L Final   Chloride 06/05/2024 102  98 - 111 mmol/L Final   CO2 06/05/2024 28  22 - 32 mmol/L Final   Glucose, Bld 06/05/2024 80  70 - 99 mg/dL Final   Glucose reference range applies only to samples taken after fasting for at least 8 hours.   BUN 06/05/2024 19  6 - 20 mg/dL Final   Creatinine, Ser 06/05/2024 1.00  0.61 - 1.24 mg/dL Final   Calcium 87/94/7974 9.1  8.9 - 10.3 mg/dL Final   Total Protein 87/94/7974 6.6  6.5 - 8.1 g/dL Final   Albumin 87/94/7974 3.9  3.5 - 5.0 g/dL Final   AST 87/94/7974 17  15 - 41 U/L Final   ALT 06/05/2024 14  0 - 44 U/L Final   Alkaline Phosphatase 06/05/2024 59  38 - 126 U/L Final   Total Bilirubin 06/05/2024 0.2  0.0 - 1.2 mg/dL Final   GFR, Estimated 06/05/2024 >60  >60 mL/min Final   Comment: (NOTE) Calculated using the CKD-EPI Creatinine Equation (2021)    Anion gap 06/05/2024 12  5 - 15 Final   Performed at [facility], [address], [place], [place] [address]   Hgb A1c MFr Bld 06/05/2024 6.0 (H)  4.8 - 5.6 % Final   Comment:  (NOTE) Diagnosis of Diabetes The following HbA1c ranges recommended by the American Diabetes Association (ADA) may be used as an aid in the diagnosis of diabetes mellitus.  Hemoglobin             Suggested A1C NGSP%  Diagnosis  <5.7                   Non Diabetic  5.7-6.4                Pre-Diabetic  >6.4                   Diabetic  <7.0                   Glycemic control for                       adults with diabetes.     Mean Plasma Glucose 06/05/2024 125.5  mg/dL Final   Performed at [facility], [address], [place], [place] [address]   Magnesium  06/05/2024 2.0  1.7 - 2.4 mg/dL Final   Performed at [facility], [address] [place], [place] [address]   Alcohol, Ethyl (B) 06/05/2024 <15  <15 mg/dL Final   Comment: (NOTE) For medical purposes only. Performed at [facility], [address], [place], [place] [address]    Color, Urine 06/05/2024 YELLOW  YELLOW Final   APPearance 06/05/2024 CLEAR  CLEAR Final   Specific Gravity, Urine 06/05/2024 1.024  1.005 - 1.030 Final   pH 06/05/2024 6.0  5.0 - 8.0 Final   Glucose, UA 06/05/2024 NEGATIVE  NEGATIVE mg/dL Final   Hgb urine dipstick 06/05/2024 NEGATIVE  NEGATIVE Final   Bilirubin Urine 06/05/2024 NEGATIVE  NEGATIVE Final   Ketones, ur 06/05/2024 NEGATIVE  NEGATIVE mg/dL Final   Protein, ur 87/94/7974 NEGATIVE  NEGATIVE mg/dL Final   Nitrite 87/94/7974 NEGATIVE  NEGATIVE Final   Leukocytes,Ua 06/05/2024 LARGE (A)  NEGATIVE Final   RBC / HPF 06/05/2024 0-5  0 - 5 RBC/hpf Final   WBC, UA 06/05/2024 0-5  0 - 5 WBC/hpf Final   Bacteria, UA 06/05/2024 NONE SEEN  NONE SEEN Final   Squamous Epithelial / HPF 06/05/2024 0-5  0 - 5 /HPF Final   Mucus 06/05/2024 PRESENT   Final   Performed at [facility], [address] [place], [place] [address]   POC Amphetamine UR 06/05/2024 None Detected  NONE DETECTED (Cut Off Level 1000 ng/mL) Final   POC Secobarbital (BAR) 06/05/2024 None  Detected  NONE DETECTED (Cut Off Level 300 ng/mL) Final   POC Buprenorphine (BUP) 06/05/2024 None Detected  NONE DETECTED (Cut Off Level 10 ng/mL) Final   POC Oxazepam (BZO) 06/05/2024 None Detected  NONE DETECTED (Cut Off Level 300 ng/mL) Final   POC Cocaine UR 06/05/2024 Positive (A)  NONE DETECTED (Cut Off Level 300 ng/mL) Final   POC Methamphetamine UR 06/05/2024 None Detected  NONE DETECTED (Cut Off Level 1000 ng/mL) Final   POC Morphine 06/05/2024 None Detected  NONE DETECTED (Cut Off Level 300 ng/mL) Final   POC Methadone UR 06/05/2024 None Detected  NONE DETECTED (Cut Off Level 300 ng/mL) Final   POC Oxycodone UR 06/05/2024 None Detected  NONE DETECTED (Cut Off Level 100 ng/mL) Final   POC Marijuana UR 06/05/2024 Positive (A)  NONE DETECTED (Cut Off Level 50 ng/mL) Final  Admission on 03/22/2024, Discharged on 03/23/2024  Component Date Value Ref Range Status   Sodium 03/22/2024 145  135 - 145 mmol/L Final   Potassium 03/22/2024 3.2 (L)  3.5 - 5.1 mmol/L Final   Chloride 03/22/2024 109  98 - 111 mmol/L Final   CO2 03/22/2024 21 (L)  22 -  32 mmol/L Final   Glucose, Bld 03/22/2024 120 (H)  70 - 99 mg/dL Final   Glucose reference range applies only to samples taken after fasting for at least 8 hours.   BUN 03/22/2024 14  6 - 20 mg/dL Final   Creatinine, Ser 03/22/2024 1.06  0.61 - 1.24 mg/dL Final   Calcium 90/78/7974 8.3 (L)  8.9 - 10.3 mg/dL Final   Total Protein 90/78/7974 5.9 (L)  6.5 - 8.1 g/dL Final   Albumin 90/78/7974 3.9  3.5 - 5.0 g/dL Final   AST 90/78/7974 24  15 - 41 U/L Final   ALT 03/22/2024 17  0 - 44 U/L Final   Alkaline Phosphatase 03/22/2024 62  38 - 126 U/L Final   Total Bilirubin 03/22/2024 0.4  0.0 - 1.2 mg/dL Final   GFR, Estimated 03/22/2024 >60  >60 mL/min Final   Comment: (NOTE) Calculated using the CKD-EPI Creatinine Equation (2021)    Anion gap 03/22/2024 15  5 - 15 Final   Performed at [facility], [address] [place],  [place] [address]   Alcohol, Ethyl (B) 03/22/2024 <15  <15 mg/dL Final   Comment: (NOTE) For medical purposes only. Performed at [facility], [address], [place], [place] [address]    Lipase 03/22/2024 18  11 - 51 U/L Final   Performed at [facility], [address], [place], [place] [address]   Troponin T High Sensitivity 03/22/2024 <15  0 - 19 ng/L Final   Comment: (NOTE) Biotin concentrations > 1000 ng/mL falsely decrease TnT results.  Serial cardiac troponin measurements are suggested.  Refer to the Links section for chest pain algorithms and additional  guidance. Performed at [facility], [address], [place], [place] [address]    WBC 03/22/2024 5.8  4.0 - 10.5 K/uL Final   RBC 03/22/2024 4.11 (L)  4.22 - 5.81 MIL/uL Final   Hemoglobin 03/22/2024 11.4 (L)  13.0 - 17.0 g/dL Final   HCT 90/78/7974 36.4 (L)  39.0 - 52.0 % Final   MCV 03/22/2024 88.6  80.0 - 100.0 fL Final   MCH 03/22/2024 27.7  26.0 - 34.0 pg Final   MCHC 03/22/2024 31.3  30.0 - 36.0 g/dL Final   RDW 90/78/7974 15.9 (H)  11.5 - 15.5 % Final   Platelets 03/22/2024 248  150 - 400 K/uL Final   nRBC 03/22/2024 0.0  0.0 - 0.2 % Final   Neutrophils Relative % 03/22/2024 66  % Final   Neutro Abs 03/22/2024 3.9  1.7 - 7.7 K/uL Final   Lymphocytes Relative 03/22/2024 23  % Final   Lymphs Abs 03/22/2024 1.3  0.7 - 4.0 K/uL Final   Monocytes Relative 03/22/2024 7  % Final   Monocytes Absolute 03/22/2024 0.4  0.1 - 1.0 K/uL Final   Eosinophils Relative 03/22/2024 3  % Final   Eosinophils Absolute 03/22/2024 0.2  0.0 - 0.5 K/uL Final   Basophils Relative 03/22/2024 1  % Final   Basophils Absolute 03/22/2024 0.0  0.0 - 0.1 K/uL Final   Immature Granulocytes 03/22/2024 0  % Final   Abs Immature Granulocytes 03/22/2024 0.01  0.00 - 0.07 K/uL Final   Performed at [facility], [address], [place], [place] [address]   Color, Urine 03/22/2024  YELLOW  YELLOW Final   APPearance 03/22/2024 CLEAR  CLEAR Final   Specific Gravity, Urine 03/22/2024 1.019  1.005 - 1.030 Final   pH 03/22/2024 5.0  5.0 - 8.0 Final   Glucose, UA 03/22/2024 NEGATIVE  NEGATIVE mg/dL Final   Hgb urine dipstick 03/22/2024 NEGATIVE  NEGATIVE Final   Bilirubin Urine 03/22/2024 NEGATIVE  NEGATIVE Final   Ketones, ur 03/22/2024 5 (A)  NEGATIVE mg/dL Final   Protein, ur 90/78/7974 30 (A)  NEGATIVE mg/dL Final   Nitrite 90/78/7974 NEGATIVE  NEGATIVE Final   Leukocytes,Ua 03/22/2024 NEGATIVE  NEGATIVE Final   RBC / HPF 03/22/2024 0-5  0 - 5 RBC/hpf Final   WBC, UA 03/22/2024 0-5  0 - 5 WBC/hpf Final   Bacteria, UA 03/22/2024 NONE SEEN  NONE SEEN Final   Squamous Epithelial / HPF 03/22/2024 0-5  0 - 5 /HPF Final   Mucus 03/22/2024 PRESENT   Final   Hyaline Casts, UA 03/22/2024 PRESENT   Final   Sperm, UA 03/22/2024 PRESENT   Final   Performed at [facility], [address], [place], [place] [address]   Opiates 03/22/2024 NEGATIVE  NEGATIVE Final   Cocaine 03/22/2024 POSITIVE (A)  NEGATIVE Final   Benzodiazepines 03/22/2024 NEGATIVE  NEGATIVE Final   Amphetamines 03/22/2024 NEGATIVE  NEGATIVE Final   Tetrahydrocannabinol 03/22/2024 POSITIVE (A)  NEGATIVE Final   Barbiturates 03/22/2024 NEGATIVE  NEGATIVE Final   Methadone Scn, Ur 03/22/2024 NEGATIVE  NEGATIVE Final   Fentanyl  03/22/2024 NEGATIVE  NEGATIVE Final   Comment: (NOTE) Drug screen is for Medical Purposes only. Positive results are preliminary only. If confirmation is needed, notify lab within 5 days.  Drug Class                 Cutoff (ng/mL) Amphetamine and metabolites 1000 Barbiturate and metabolites 200 Benzodiazepine              200 Opiates and metabolites     300 Cocaine and metabolites     300 THC                         50 Fentanyl                     5 Methadone                   300  Trazodone  is metabolized in vivo to several metabolites,  including  pharmacologically active m-CPP, which is excreted in the  urine.  Immunoassay screens for amphetamines and MDMA have potential  cross-reactivity with these compounds and may provide false positive  result.  Performed at [facility], [address], [place], [place] [address]    Troponin T High Sensitivity 03/22/2024 <15  0 - 19 ng/L Final   Comment: HEMOLYZED SPECIMEN - SUGGEST RECOLLECT (NOTE) Biotin concentrations > 1000 ng/mL falsely decrease TnT results.  Serial cardiac troponin measurements are suggested.  Refer to the Links section for chest pain algorithms and additional  guidance. Performed at [facility], [address], [place], [place] [address]   Admission on 03/02/2024, Discharged on 03/02/2024  Component Date Value Ref Range Status   WBC 03/02/2024 7.6  4.0 - 10.5 K/uL Final   RBC 03/02/2024 4.86  4.22 - 5.81 MIL/uL Final   Hemoglobin 03/02/2024 13.7  13.0 - 17.0 g/dL Final   HCT 90/98/7974 41.0  39.0 - 52.0 % Final   MCV 03/02/2024 84.4  80.0 - 100.0 fL Final   MCH 03/02/2024 28.2  26.0 - 34.0 pg Final   MCHC 03/02/2024 33.4  30.0 - 36.0 g/dL Final  RDW 03/02/2024 14.9  11.5 - 15.5 % Final   Platelets 03/02/2024 207  150 - 400 K/uL Final   nRBC 03/02/2024 0.0  0.0 - 0.2 % Final   Neutrophils Relative % 03/02/2024 62  % Final   Neutro Abs 03/02/2024 4.7  1.7 - 7.7 K/uL Final   Lymphocytes Relative 03/02/2024 24  % Final   Lymphs Abs 03/02/2024 1.8  0.7 - 4.0 K/uL Final   Monocytes Relative 03/02/2024 10  % Final   Monocytes Absolute 03/02/2024 0.8  0.1 - 1.0 K/uL Final   Eosinophils Relative 03/02/2024 3  % Final   Eosinophils Absolute 03/02/2024 0.2  0.0 - 0.5 K/uL Final   Basophils Relative 03/02/2024 1  % Final   Basophils Absolute 03/02/2024 0.1  0.0 - 0.1 K/uL Final   Immature Granulocytes 03/02/2024 0  % Final   Abs Immature Granulocytes 03/02/2024 0.01  0.00 - 0.07 K/uL Final   Performed at [facility], [address], [place], [place] [address]   Sodium 03/02/2024 140  135 - 145 mmol/L Final   Potassium 03/02/2024 3.1 (L)  3.5 - 5.1 mmol/L Final   Chloride 03/02/2024 102  98 - 111 mmol/L Final   BUN 03/02/2024 15  6 - 20 mg/dL Final   Creatinine, Ser 03/02/2024 0.90  0.61 - 1.24 mg/dL Final   Glucose, Bld 90/98/7974 116 (H)  70 - 99 mg/dL Final   Glucose reference range applies only to samples taken after fasting for at least 8 hours.   Calcium, Ion 03/02/2024 1.17  1.15 - 1.40 mmol/L Final   TCO2 03/02/2024 24  22 - 32 mmol/L Final   Hemoglobin 03/02/2024 15.3  13.0 - 17.0 g/dL Final   HCT 90/98/7974 45.0  39.0 - 52.0 % Final   Color, Urine 03/02/2024 YELLOW  YELLOW Final   APPearance 03/02/2024 HAZY (A)  CLEAR Final   Specific Gravity, Urine 03/02/2024 1.026  1.005 - 1.030 Final   pH 03/02/2024 5.0  5.0 - 8.0 Final   Glucose, UA 03/02/2024 NEGATIVE  NEGATIVE mg/dL Final   Hgb urine dipstick 03/02/2024 NEGATIVE  NEGATIVE Final   Bilirubin Urine 03/02/2024 NEGATIVE  NEGATIVE Final   Ketones, ur 03/02/2024 5 (A)  NEGATIVE mg/dL Final   Protein, ur 90/98/7974 NEGATIVE  NEGATIVE mg/dL Final   Nitrite 90/98/7974 NEGATIVE  NEGATIVE Final   Leukocytes,Ua 03/02/2024 NEGATIVE  NEGATIVE Final   Performed at [facility], [address] [place], [place] [address]   RPR Ser Ql 03/02/2024 NON REACTIVE  NON REACTIVE Final   Performed at [facility], [address], [place], [place] [address]   Neisseria Gonorrhea 03/02/2024 Negative   Final   Chlamydia 03/02/2024 Negative   Final   Comment 03/02/2024 Normal Reference Ranger Chlamydia - Negative   Final   Comment 03/02/2024 Normal Reference Range Neisseria Gonorrhea - Negative   Final    Blood Alcohol level:  Lab Results  Component Value Date   [facility] <15 06/05/2024   ETH <15 03/22/2024    Metabolic Disorder Labs: Lab Results  Component Value Date   HGBA1C 6.0 (H) 06/05/2024   MPG 125.5 06/05/2024   MPG 128 (H) 11/06/2010    No results found for: PROLACTIN Lab Results  Component Value Date   CHOL 177 04/29/2019   TRIG 136 04/29/2019   HDL 58 04/29/2019   CHOLHDL 3.1 04/29/2019   VLDL 27 12/18/2010   LDLCALC 95 04/29/2019   LDLCALC 75 12/18/2010  Therapeutic Lab Levels: No results found for: LITHIUM No results found for: VALPROATE No results found for: CBMZ  Physical Findings   PHQ2-9    Flowsheet Row ED from 06/06/2024 in [facility] from 09/25/2019 in [facility] at [facility] Visit from 06/12/2013 in [facility] - A Dept Of [place] Office Visit from 06/03/2013 in [facility] - A Dept Of [place]. [facility] Office Visit from 03/03/2013 in [facility] - A Dept Of [place]. [facility]  PHQ-2 Total Score 4 1 0 0 0  PHQ-9 Total Score 11 -- -- -- --   Flowsheet Row ED from 06/05/2024 in [facility] ED from 03/02/2024 in [facility] Emergency Department at [facility] ED from 11/21/2023 in [facility] Emergency Department at [facility]  C-SSRS RISK CATEGORY No Risk No Risk No Risk     Musculoskeletal  Strength & Muscle Tone: within normal limits Gait & Station: normal Patient leans: N/A  Psychiatric Specialty Exam  Presentation  General Appearance:  Appropriate for Environment  Eye Contact: Good  Speech: Clear and Coherent  Speech Volume: Normal  Handedness: Right   Mood and Affect  Mood: Irritable  Affect: Congruent   Thought Process  Thought Processes: Coherent  Descriptions of Associations:Intact  Orientation:Full (Time, Place and Person)  Thought Content:Logical     Hallucinations:Hallucinations: None  Ideas of Reference:None  Suicidal Thoughts:Suicidal Thoughts: No  Homicidal Thoughts:Homicidal Thoughts: No   Sensorium   Memory: Immediate Fair; Recent Fair  Judgment: Fair  Insight: Fair   Art Therapist  Concentration: Fair  Attention Span: Fair  Recall: Good  Fund of Knowledge: Good  Language: Good   Psychomotor Activity  Psychomotor Activity: Psychomotor Activity: Normal   Assets  Assets: Desire for Improvement; Communication Skills   Sleep  Sleep: Sleep: Fair  Estimated Sleeping Duration (Last 24 Hours): 10.50-11.75 hours  Nutritional Assessment (For OBS and FBC admissions only) Has the patient had a weight loss or gain of 10 pounds or more in the last 3 months?: No Has the patient had a decrease in food intake/or appetite?: No Does the patient have dental problems?: No Does the patient have eating habits or behaviors that may be indicators of an eating disorder including binging or inducing vomiting?: No Has the patient recently lost weight without trying?: 0 Has the patient been eating poorly because of a decreased appetite?: 0 Malnutrition Screening Tool Score: 0    Physical Exam  Physical Exam ROS Blood pressure 127/83, pulse (!) 54, temperature 98.1 F (36.7 C), temperature source Oral, resp. rate 18, SpO2 100%. There is no height or weight on file to calculate BMI.  Treatment Plan Summary: Daily contact with patient to assess and evaluate symptoms and progress in treatment and Medication management  Continue with current treatment plan on 06/07/2024  Major Depression: Substance-induced mood disorder:  Continue Zoloft  50 mg daily As needed CIWA/see chart for protocol -Restarted home medication with metformin  chart reviewed A1c 6.0 Continue trazodone  50 mg p.o. nightly  CSW to follow-up with discharge disposition -Patient encouraged to participate in therapeutic milieu   Staci LOISE Kerns, NP 06/07/2024 12:13 PM
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
## 2024-06-08 DIAGNOSIS — F1994 Other psychoactive substance use, unspecified with psychoactive substance-induced mood disorder: Secondary | ICD-10-CM | POA: Diagnosis not present

## 2024-06-08 DIAGNOSIS — F411 Generalized anxiety disorder: Secondary | ICD-10-CM | POA: Diagnosis not present

## 2024-06-08 DIAGNOSIS — F1290 Cannabis use, unspecified, uncomplicated: Secondary | ICD-10-CM | POA: Diagnosis not present

## 2024-06-08 DIAGNOSIS — F1410 Cocaine abuse, uncomplicated: Secondary | ICD-10-CM | POA: Diagnosis not present

## 2024-06-08 MED ORDER — WHITE PETROLATUM EX OINT
TOPICAL_OINTMENT | CUTANEOUS | Status: DC | PRN
  Administered 2024-06-08: 3 via TOPICAL
  Filled 2024-06-08: qty 15

## 2024-06-08 NOTE — ED Provider Notes (Incomplete)
 Behavioral Health Progress Note  Date and Time: 06/08/2024 4:39 PM Name: William Wood MRN:  [id number]  HPI:per admission assessment note: William Wood was admitted due to substance induced mood disorder and depressive symptoms.  Reports taking treatment related to cocaine, marijuana and occasional alcohol use.patient states that he came in today to get help with drugs and mental health. States that he was released from [facility] 4 months ago after completing a 30-day stay. Patient states that he had a return to use of substances 3 weeks after discharge from [facility]. He endorses using cocaine or I think it is cocaine and what ever else they mix it with. He estimates his cocaine use is 1 to 1-1/2 g daily via smoking with last use being this morning. Endorses the use of marijuana 3 blunts a day with last use this morning. He endorses the use of alcohol as much as I can get and estimates that he drinks 3-4 mikes hard lemonade's most days of the week with last use this morning. He denies methamphetamine. He is unsure about heroin or fentanyl  as he thinks that may have been mixed in with his cocaine because this time I feel different. He states that cocaine causes him paranoia which leads to him experiencing bouts of anger.     Assessment on 06/08/24:  Pt was in his room and alert and oriented on approach. Pt reports that his sleep was fair last night, and he is not having any withdrawal symptoms or cravings, and no pain. Pt reports that his appetite is better, and states that his energy level has been low. Pt reports that both his depression and anxiety are rated a 5/10 on a scale of 1 to 10, with 10 being the worst. Pt denies HI (plan and intent), AVH, and paranoia. However, pt does endorse passive SI, with no plan or intent, and he contracts for safety. Pt reports no constipation, with his last bowel movement on yesterday. Pt reports he wants to work on his sobriety in rehab.   Diagnosis:  Final  diagnoses:  Substance induced mood disorder (HCC)  Polysubstance abuse (HCC)    Total Time spent with patient: 45 minutes  Past Psychiatric History: Alcohol abuse disorder, polysubstance abuse. Previous inpatient admissions has attended residential treatment facility in the past 6 months.  Past Medical History: Back pain Family History: None reported Family Psychiatric  History: None reported Social History: Unemployed, housing instability   Additional Social History: None    Sleep: Fair  Appetite:  Good  Current Medications:  Current Facility-Administered Medications  Medication Dose Route Frequency Provider Last Rate Last Admin   acetaminophen  (TYLENOL ) tablet 650 mg  650 mg Oral Q6H PRN Hobson, Fran E, NP       alum & mag hydroxide-simeth (MAALOX/MYLANTA) 200-200-20 MG/5ML suspension 30 mL  30 mL Oral Q4H PRN Hobson, Fran E, NP       magnesium  hydroxide (MILK OF MAGNESIA) suspension 30 mL  30 mL Oral Daily PRN Hobson, Fran E, NP       OLANZapine  (ZYPREXA ) injection 5 mg  5 mg Intramuscular TID PRN Hobson, Fran E, NP       OLANZapine  zydis (ZYPREXA ) disintegrating tablet 5 mg  5 mg Oral TID PRN Hobson, Fran E, NP       sertraline  (ZOLOFT ) tablet 50 mg  50 mg Oral Daily Lewis, Tanika N, NP   50 mg at 06/08/24 0943   traZODone  (DESYREL ) tablet 50 mg  50 mg Oral QHS PRN  Hobson, Fran E, NP       white petrolatum  (VASELINE) gel   Topical PRN Gottfried, Rhoda J, MD   3 Application at 06/08/24 [phone]   No current outpatient medications on file.    Labs  Lab Results:  Admission on 06/05/2024, Discharged on 06/05/2024  Component Date Value Ref Range Status   WBC 06/05/2024 5.5  4.0 - 10.5 K/uL Final   RBC 06/05/2024 4.81  4.22 - 5.81 MIL/uL Final   Hemoglobin 06/05/2024 13.7  13.0 - 17.0 g/dL Final   HCT 87/94/7974 40.9  39.0 - 52.0 % Final   MCV 06/05/2024 85.0  80.0 - 100.0 fL Final   MCH 06/05/2024 28.5  26.0 - 34.0 pg Final   MCHC 06/05/2024 33.5  30.0 - 36.0 g/dL Final   RDW  87/94/7974 15.2  11.5 - 15.5 % Final   Platelets 06/05/2024 203  150 - 400 K/uL Final   nRBC 06/05/2024 0.0  0.0 - 0.2 % Final   Neutrophils Relative % 06/05/2024 42  % Final   Neutro Abs 06/05/2024 2.3  1.7 - 7.7 K/uL Final   Lymphocytes Relative 06/05/2024 42  % Final   Lymphs Abs 06/05/2024 2.3  0.7 - 4.0 K/uL Final   Monocytes Relative 06/05/2024 9  % Final   Monocytes Absolute 06/05/2024 0.5  0.1 - 1.0 K/uL Final   Eosinophils Relative 06/05/2024 6  % Final   Eosinophils Absolute 06/05/2024 0.3  0.0 - 0.5 K/uL Final   Basophils Relative 06/05/2024 1  % Final   Basophils Absolute 06/05/2024 0.1  0.0 - 0.1 K/uL Final   Immature Granulocytes 06/05/2024 0  % Final   Abs Immature Granulocytes 06/05/2024 0.01  0.00 - 0.07 K/uL Final   Performed at [facility], [address], [place], [place] [address]   Sodium 06/05/2024 142  135 - 145 mmol/L Final   Potassium 06/05/2024 3.6  3.5 - 5.1 mmol/L Final   Chloride 06/05/2024 102  98 - 111 mmol/L Final   CO2 06/05/2024 28  22 - 32 mmol/L Final   Glucose, Bld 06/05/2024 80  70 - 99 mg/dL Final   Glucose reference range applies only to samples taken after fasting for at least 8 hours.   BUN 06/05/2024 19  6 - 20 mg/dL Final   Creatinine, Ser 06/05/2024 1.00  0.61 - 1.24 mg/dL Final   Calcium 87/94/7974 9.1  8.9 - 10.3 mg/dL Final   Total Protein 87/94/7974 6.6  6.5 - 8.1 g/dL Final   Albumin 87/94/7974 3.9  3.5 - 5.0 g/dL Final   AST 87/94/7974 17  15 - 41 U/L Final   ALT 06/05/2024 14  0 - 44 U/L Final   Alkaline Phosphatase 06/05/2024 59  38 - 126 U/L Final   Total Bilirubin 06/05/2024 0.2  0.0 - 1.2 mg/dL Final   GFR, Estimated 06/05/2024 >60  >60 mL/min Final   Comment: (NOTE) Calculated using the CKD-EPI Creatinine Equation (2021)    Anion gap 06/05/2024 12  5 - 15 Final   Performed at [facility], [address], [place], [place] [address]   Hgb A1c MFr Bld 06/05/2024 6.0 (H)  4.8 - 5.6 % Final   Comment:  (NOTE) Diagnosis of Diabetes The following HbA1c ranges recommended by the American Diabetes Association (ADA) may be used as an aid in the diagnosis of diabetes mellitus.  Hemoglobin             Suggested A1C NGSP%  Diagnosis  <5.7                   Non Diabetic  5.7-6.4                Pre-Diabetic  >6.4                   Diabetic  <7.0                   Glycemic control for                       adults with diabetes.     Mean Plasma Glucose 06/05/2024 125.5  mg/dL Final   Performed at [facility], [address] [place], [place] [address]   Magnesium  06/05/2024 2.0  1.7 - 2.4 mg/dL Final   Performed at [facility], [address], [place], [place] [address]   Alcohol, Ethyl (B) 06/05/2024 <15  <15 mg/dL Final   Comment: (NOTE) For medical purposes only. Performed at [facility], [address] [place], [place] [address]    Color, Urine 06/05/2024 YELLOW  YELLOW Final   APPearance 06/05/2024 CLEAR  CLEAR Final   Specific Gravity, Urine 06/05/2024 1.024  1.005 - 1.030 Final   pH 06/05/2024 6.0  5.0 - 8.0 Final   Glucose, UA 06/05/2024 NEGATIVE  NEGATIVE mg/dL Final   Hgb urine dipstick 06/05/2024 NEGATIVE  NEGATIVE Final   Bilirubin Urine 06/05/2024 NEGATIVE  NEGATIVE Final   Ketones, ur 06/05/2024 NEGATIVE  NEGATIVE mg/dL Final   Protein, ur 87/94/7974 NEGATIVE  NEGATIVE mg/dL Final   Nitrite 87/94/7974 NEGATIVE  NEGATIVE Final   Leukocytes,Ua 06/05/2024 LARGE (A)  NEGATIVE Final   RBC / HPF 06/05/2024 0-5  0 - 5 RBC/hpf Final   WBC, UA 06/05/2024 0-5  0 - 5 WBC/hpf Final   Bacteria, UA 06/05/2024 NONE SEEN  NONE SEEN Final   Squamous Epithelial / HPF 06/05/2024 0-5  0 - 5 /HPF Final   Mucus 06/05/2024 PRESENT   Final   Performed at [facility], [address] [place], [place] [address]   POC Amphetamine UR 06/05/2024 None Detected  NONE DETECTED (Cut Off Level 1000 ng/mL) Final   POC Secobarbital (BAR) 06/05/2024 None  Detected  NONE DETECTED (Cut Off Level 300 ng/mL) Final   POC Buprenorphine (BUP) 06/05/2024 None Detected  NONE DETECTED (Cut Off Level 10 ng/mL) Final   POC Oxazepam (BZO) 06/05/2024 None Detected  NONE DETECTED (Cut Off Level 300 ng/mL) Final   POC Cocaine UR 06/05/2024 Positive (A)  NONE DETECTED (Cut Off Level 300 ng/mL) Final   POC Methamphetamine UR 06/05/2024 None Detected  NONE DETECTED (Cut Off Level 1000 ng/mL) Final   POC Morphine 06/05/2024 None Detected  NONE DETECTED (Cut Off Level 300 ng/mL) Final   POC Methadone UR 06/05/2024 None Detected  NONE DETECTED (Cut Off Level 300 ng/mL) Final   POC Oxycodone UR 06/05/2024 None Detected  NONE DETECTED (Cut Off Level 100 ng/mL) Final   POC Marijuana UR 06/05/2024 Positive (A)  NONE DETECTED (Cut Off Level 50 ng/mL) Final  Admission on 03/22/2024, Discharged on 03/23/2024  Component Date Value Ref Range Status   Sodium 03/22/2024 145  135 - 145 mmol/L Final   Potassium 03/22/2024 3.2 (L)  3.5 - 5.1 mmol/L Final   Chloride 03/22/2024 109  98 - 111 mmol/L Final   CO2 03/22/2024 21 (L)  22 -  32 mmol/L Final   Glucose, Bld 03/22/2024 120 (H)  70 - 99 mg/dL Final   Glucose reference range applies only to samples taken after fasting for at least 8 hours.   BUN 03/22/2024 14  6 - 20 mg/dL Final   Creatinine, Ser 03/22/2024 1.06  0.61 - 1.24 mg/dL Final   Calcium 90/78/7974 8.3 (L)  8.9 - 10.3 mg/dL Final   Total Protein 90/78/7974 5.9 (L)  6.5 - 8.1 g/dL Final   Albumin 90/78/7974 3.9  3.5 - 5.0 g/dL Final   AST 90/78/7974 24  15 - 41 U/L Final   ALT 03/22/2024 17  0 - 44 U/L Final   Alkaline Phosphatase 03/22/2024 62  38 - 126 U/L Final   Total Bilirubin 03/22/2024 0.4  0.0 - 1.2 mg/dL Final   GFR, Estimated 03/22/2024 >60  >60 mL/min Final   Comment: (NOTE) Calculated using the CKD-EPI Creatinine Equation (2021)    Anion gap 03/22/2024 15  5 - 15 Final   Performed at [facility], [address], [place],  [place] [address]   Alcohol, Ethyl (B) 03/22/2024 <15  <15 mg/dL Final   Comment: (NOTE) For medical purposes only. Performed at [facility], [address], [place], [place] [address]    Lipase 03/22/2024 18  11 - 51 U/L Final   Performed at [facility], [address], [place], [place] [address]   Troponin T High Sensitivity 03/22/2024 <15  0 - 19 ng/L Final   Comment: (NOTE) Biotin concentrations > 1000 ng/mL falsely decrease TnT results.  Serial cardiac troponin measurements are suggested.  Refer to the Links section for chest pain algorithms and additional  guidance. Performed at [facility], [address] [place], [place] [address]    WBC 03/22/2024 5.8  4.0 - 10.5 K/uL Final   RBC 03/22/2024 4.11 (L)  4.22 - 5.81 MIL/uL Final   Hemoglobin 03/22/2024 11.4 (L)  13.0 - 17.0 g/dL Final   HCT 90/78/7974 36.4 (L)  39.0 - 52.0 % Final   MCV 03/22/2024 88.6  80.0 - 100.0 fL Final   MCH 03/22/2024 27.7  26.0 - 34.0 pg Final   MCHC 03/22/2024 31.3  30.0 - 36.0 g/dL Final   RDW 90/78/7974 15.9 (H)  11.5 - 15.5 % Final   Platelets 03/22/2024 248  150 - 400 K/uL Final   nRBC 03/22/2024 0.0  0.0 - 0.2 % Final   Neutrophils Relative % 03/22/2024 66  % Final   Neutro Abs 03/22/2024 3.9  1.7 - 7.7 K/uL Final   Lymphocytes Relative 03/22/2024 23  % Final   Lymphs Abs 03/22/2024 1.3  0.7 - 4.0 K/uL Final   Monocytes Relative 03/22/2024 7  % Final   Monocytes Absolute 03/22/2024 0.4  0.1 - 1.0 K/uL Final   Eosinophils Relative 03/22/2024 3  % Final   Eosinophils Absolute 03/22/2024 0.2  0.0 - 0.5 K/uL Final   Basophils Relative 03/22/2024 1  % Final   Basophils Absolute 03/22/2024 0.0  0.0 - 0.1 K/uL Final   Immature Granulocytes 03/22/2024 0  % Final   Abs Immature Granulocytes 03/22/2024 0.01  0.00 - 0.07 K/uL Final   Performed at [facility], [address] [place], [place] [address]   Color, Urine 03/22/2024  YELLOW  YELLOW Final   APPearance 03/22/2024 CLEAR  CLEAR Final   Specific Gravity, Urine 03/22/2024 1.019  1.005 - 1.030 Final   pH 03/22/2024 5.0  5.0 - 8.0 Final   Glucose, UA 03/22/2024 NEGATIVE  NEGATIVE mg/dL Final   Hgb urine dipstick 03/22/2024 NEGATIVE  NEGATIVE Final   Bilirubin Urine 03/22/2024 NEGATIVE  NEGATIVE Final   Ketones, ur 03/22/2024 5 (A)  NEGATIVE mg/dL Final   Protein, ur 90/78/7974 30 (A)  NEGATIVE mg/dL Final   Nitrite 90/78/7974 NEGATIVE  NEGATIVE Final   Leukocytes,Ua 03/22/2024 NEGATIVE  NEGATIVE Final   RBC / HPF 03/22/2024 0-5  0 - 5 RBC/hpf Final   WBC, UA 03/22/2024 0-5  0 - 5 WBC/hpf Final   Bacteria, UA 03/22/2024 NONE SEEN  NONE SEEN Final   Squamous Epithelial / HPF 03/22/2024 0-5  0 - 5 /HPF Final   Mucus 03/22/2024 PRESENT   Final   Hyaline Casts, UA 03/22/2024 PRESENT   Final   Sperm, UA 03/22/2024 PRESENT   Final   Performed at [facility], [address] [place], [place] [address]   Opiates 03/22/2024 NEGATIVE  NEGATIVE Final   Cocaine 03/22/2024 POSITIVE (A)  NEGATIVE Final   Benzodiazepines 03/22/2024 NEGATIVE  NEGATIVE Final   Amphetamines 03/22/2024 NEGATIVE  NEGATIVE Final   Tetrahydrocannabinol 03/22/2024 POSITIVE (A)  NEGATIVE Final   Barbiturates 03/22/2024 NEGATIVE  NEGATIVE Final   Methadone Scn, Ur 03/22/2024 NEGATIVE  NEGATIVE Final   Fentanyl  03/22/2024 NEGATIVE  NEGATIVE Final   Comment: (NOTE) Drug screen is for Medical Purposes only. Positive results are preliminary only. If confirmation is needed, notify lab within 5 days.  Drug Class                 Cutoff (ng/mL) Amphetamine and metabolites 1000 Barbiturate and metabolites 200 Benzodiazepine              200 Opiates and metabolites     300 Cocaine and metabolites     300 THC                         50 Fentanyl                     5 Methadone                   300  Trazodone  is metabolized in vivo to several metabolites,  including  pharmacologically active m-CPP, which is excreted in the  urine.  Immunoassay screens for amphetamines and MDMA have potential  cross-reactivity with these compounds and may provide false positive  result.  Performed at [facility], [address] [place], [place] [address]    Troponin T High Sensitivity 03/22/2024 <15  0 - 19 ng/L Final   Comment: HEMOLYZED SPECIMEN - SUGGEST RECOLLECT (NOTE) Biotin concentrations > 1000 ng/mL falsely decrease TnT results.  Serial cardiac troponin measurements are suggested.  Refer to the Links section for chest pain algorithms and additional  guidance. Performed at [facility], [address], [place], [place] [address]   Admission on 03/02/2024, Discharged on 03/02/2024  Component Date Value Ref Range Status   WBC 03/02/2024 7.6  4.0 - 10.5 K/uL Final   RBC 03/02/2024 4.86  4.22 - 5.81 MIL/uL Final   Hemoglobin 03/02/2024 13.7  13.0 - 17.0 g/dL Final   HCT 90/98/7974 41.0  39.0 - 52.0 % Final   MCV 03/02/2024 84.4  80.0 - 100.0 fL Final   MCH 03/02/2024 28.2  26.0 - 34.0 pg Final   MCHC 03/02/2024 33.4  30.0 - 36.0 g/dL Final  RDW 03/02/2024 14.9  11.5 - 15.5 % Final   Platelets 03/02/2024 207  150 - 400 K/uL Final   nRBC 03/02/2024 0.0  0.0 - 0.2 % Final   Neutrophils Relative % 03/02/2024 62  % Final   Neutro Abs 03/02/2024 4.7  1.7 - 7.7 K/uL Final   Lymphocytes Relative 03/02/2024 24  % Final   Lymphs Abs 03/02/2024 1.8  0.7 - 4.0 K/uL Final   Monocytes Relative 03/02/2024 10  % Final   Monocytes Absolute 03/02/2024 0.8  0.1 - 1.0 K/uL Final   Eosinophils Relative 03/02/2024 3  % Final   Eosinophils Absolute 03/02/2024 0.2  0.0 - 0.5 K/uL Final   Basophils Relative 03/02/2024 1  % Final   Basophils Absolute 03/02/2024 0.1  0.0 - 0.1 K/uL Final   Immature Granulocytes 03/02/2024 0  % Final   Abs Immature Granulocytes 03/02/2024 0.01  0.00 - 0.07 K/uL Final   Performed at [facility]  Lab, [address], [place], [place] [address]   Sodium 03/02/2024 140  135 - 145 mmol/L Final   Potassium 03/02/2024 3.1 (L)  3.5 - 5.1 mmol/L Final   Chloride 03/02/2024 102  98 - 111 mmol/L Final   BUN 03/02/2024 15  6 - 20 mg/dL Final   Creatinine, Ser 03/02/2024 0.90  0.61 - 1.24 mg/dL Final   Glucose, Bld 90/98/7974 116 (H)  70 - 99 mg/dL Final   Glucose reference range applies only to samples taken after fasting for at least 8 hours.   Calcium, Ion 03/02/2024 1.17  1.15 - 1.40 mmol/L Final   TCO2 03/02/2024 24  22 - 32 mmol/L Final   Hemoglobin 03/02/2024 15.3  13.0 - 17.0 g/dL Final   HCT 90/98/7974 45.0  39.0 - 52.0 % Final   Color, Urine 03/02/2024 YELLOW  YELLOW Final   APPearance 03/02/2024 HAZY (A)  CLEAR Final   Specific Gravity, Urine 03/02/2024 1.026  1.005 - 1.030 Final   pH 03/02/2024 5.0  5.0 - 8.0 Final   Glucose, UA 03/02/2024 NEGATIVE  NEGATIVE mg/dL Final   Hgb urine dipstick 03/02/2024 NEGATIVE  NEGATIVE Final   Bilirubin Urine 03/02/2024 NEGATIVE  NEGATIVE Final   Ketones, ur 03/02/2024 5 (A)  NEGATIVE mg/dL Final   Protein, ur 90/98/7974 NEGATIVE  NEGATIVE mg/dL Final   Nitrite 90/98/7974 NEGATIVE  NEGATIVE Final   Leukocytes,Ua 03/02/2024 NEGATIVE  NEGATIVE Final   Performed at [facility], [address] [place], [place] [address]   RPR Ser Ql 03/02/2024 NON REACTIVE  NON REACTIVE Final   Performed at [facility], [address], [place], [place] [address]   Neisseria Gonorrhea 03/02/2024 Negative   Final   Chlamydia 03/02/2024 Negative   Final   Comment 03/02/2024 Normal Reference Ranger Chlamydia - Negative   Final   Comment 03/02/2024 Normal Reference Range Neisseria Gonorrhea - Negative   Final    Blood Alcohol level:  Lab Results  Component Value Date   [facility] <15 06/05/2024   ETH <15 03/22/2024    Metabolic Disorder Labs: Lab Results  Component Value Date   HGBA1C 6.0 (H) 06/05/2024   MPG 125.5 06/05/2024   MPG 128 (H) 11/06/2010    No results found for: PROLACTIN Lab Results  Component Value Date   CHOL 177 04/29/2019   TRIG 136 04/29/2019   HDL 58 04/29/2019   CHOLHDL 3.1 04/29/2019   VLDL 27 12/18/2010   LDLCALC 95 04/29/2019   LDLCALC 75 12/18/2010  Therapeutic Lab Levels: No results found for: LITHIUM No results found for: VALPROATE No results found for: CBMZ  Physical Findings   PHQ2-9    Flowsheet Row ED from 06/06/2024 in [facility] Nutrition from 09/25/2019 in [facility] at [facility] Visit from 06/12/2013 in [facility] - A Dept Of [place]. [facility] Office Visit from 06/03/2013 in [facility] [place] Visit from 03/03/2013 in [facility] - A Dept Of [place]. [facility]  PHQ-2 Total Score 4 1 0 0 0  PHQ-9 Total Score 11 -- -- -- --   Flowsheet Row ED from 06/05/2024 in [facility] ED from 03/02/2024 in [facility] at [facility] ED from 11/21/2023 in [facility] Emergency Department at [facility]  C-SSRS RISK CATEGORY No Risk No Risk No Risk     Musculoskeletal  Strength & Muscle Tone: within normal limits Gait & Station: normal Patient leans: N/A  Psychiatric Specialty Exam  Presentation  General Appearance:  Appropriate for Environment  Eye Contact: Good  Speech: Clear and Coherent  Speech Volume: Normal  Handedness: Right   Mood and Affect  Mood: Irritable  Affect: Congruent   Thought Process  Thought Processes: Coherent  Descriptions of Associations:Intact  Orientation:Full (Time, Place and Person)  Thought Content:Logical     Hallucinations:Hallucinations: None  Ideas of Reference:None  Suicidal Thoughts:Suicidal Thoughts: No  Homicidal Thoughts:Homicidal Thoughts: No   Sensorium   Memory: Immediate Fair; Recent Fair  Judgment: Fair  Insight: Fair   Art Therapist  Concentration: Fair  Attention Span: Fair  Recall: Good  Fund of Knowledge: Good  Language: Good   Psychomotor Activity  Psychomotor Activity: Psychomotor Activity: Normal   Assets  Assets: Desire for Improvement; Communication Skills   Sleep  Sleep: Sleep: Fair  Estimated Sleeping Duration (Last 24 Hours): 9.75-11.50 hours  No data recorded  Physical Exam  Physical Exam Vitals and nursing note reviewed.  Neurological:     Mental Status: He is oriented to person, place, and time.  Psychiatric:        Mood and Affect: Mood normal.        Behavior: Behavior normal.        Thought Content: Thought content normal.        Judgment: Judgment normal.    Review of Systems  Psychiatric/Behavioral:  Positive for depression and substance abuse. Negative for hallucinations and suicidal ideas. The patient is nervous/anxious.        Passive SI   Blood pressure 136/88, pulse (!) 46, temperature 97.9 F (36.6 C), temperature source Oral, resp. rate 16, SpO2 98%. There is no height or weight on file to calculate BMI.  Treatment Plan Summary: Daily contact with patient to assess and evaluate symptoms and progress in treatment, Medication management, and Plan :   Continue Zoloft  50 mg daily As needed CIWA/see chart for protocol -Restarted home medication with metformin  chart reviewed A1c 6.0 Continue trazodone  50 mg p.o. nightly   CSW to follow-up with discharge disposition Patient encouraged to participate in therapeutic milieu  Curtistine Clause, RN - Student NP 06/08/2024 4:39 PM

## 2024-06-08 NOTE — ED Provider Notes (Signed)
 Behavioral Health Progress Note  Date and Time: 06/08/2024 6:24 PM Name: William Wood MRN:  [id number]  HPI:per admission assessment note: William Wood was admitted due to substance induced mood disorder and depressive symptoms.  Reports taking treatment related to cocaine, marijuana and occasional alcohol use.patient states that he came in today to get help with drugs and mental health. States that he was released from [facility] 4 months ago after completing a 30-day stay. Patient states that he had a return to use of substances 3 weeks after discharge from [facility]. He endorses using cocaine or I think it is cocaine and what ever else they mix it with. He estimates his cocaine use is 1 to 1-1/2 g daily via smoking with last use being this morning. Endorses the use of marijuana 3 blunts a day with last use this morning. He endorses the use of alcohol as much as I can get and estimates that he drinks 3-4 mikes hard lemonade's most days of the week with last use this morning. He denies methamphetamine. He is unsure about heroin or fentanyl  as he thinks that may have been mixed in with his cocaine because this time I feel different. He states that cocaine causes him paranoia which leads to him experiencing bouts of anger.     Assessment on 06/08/24: Pt was in his room and alert and oriented on approach. Pt reports that his sleep was fair last night, and he is not having any withdrawal symptoms or cravings, and no pain. Pt reports that his appetite is better, and states that his energy level has been low. Pt reports that both his depression and anxiety are rated a 5/10 on a scale of 1 to 10, with 10 being the worst. Pt denies HI (plan and intent), AVH, and paranoia. However, pt does endorse passive SI, with no plan or intent, and he contracts for safety. Pt reports no constipation, with his last bowel movement on yesterday. Pt reports he wants to work on his sobriety in rehab.  Addendum:  Patient is  continuing to work with CSW to locate rehabilitation.  Patient is dealing with housing instability, but nowhere to go currently.  He continues to be interested in rehabilitation.  We are continuing current medications with no changes today, as patient denies any concerns related to medications, denies side effects.  He has a tentative discharge date of 12/12.  Patient is requesting testing for STDs, but writer will meet with patient tomorrow to determine medical necessity of this testing, and to ascertain if this can be completed on an outpatient basis.  Labs Reviewed: Ordered Urinalysis with culture due to previous Large leukocytes in pervious culture.SABRA  Heart rate low at 46, will continue to monitor this. Recommended outpatient f/u with PCP, but pt is also asymptomatic at  this time.  Diagnosis:  Final diagnoses:  Substance induced mood disorder (HCC)  Polysubstance abuse (HCC)   Total Time spent with patient: 45 minutes  Past Psychiatric History: Alcohol abuse disorder, polysubstance abuse. Previous inpatient admissions has attended residential treatment facility in the past 6 months.  Past Medical History: Back pain Family History: None reported Family Psychiatric  History: None reported Social History: Unemployed, housing instability   Additional Social History: None   Sleep: Fair  Appetite:  Good  Current Medications:  Current Facility-Administered Medications  Medication Dose Route Frequency Provider Last Rate Last Admin   acetaminophen  (TYLENOL ) tablet 650 mg  650 mg Oral Q6H PRN Hobson, Fran E, NP  alum & mag hydroxide-simeth (MAALOX/MYLANTA) 200-200-20 MG/5ML suspension 30 mL  30 mL Oral Q4H PRN Hobson, Fran E, NP       magnesium  hydroxide (MILK OF MAGNESIA) suspension 30 mL  30 mL Oral Daily PRN Hobson, Fran E, NP       OLANZapine  (ZYPREXA ) injection 5 mg  5 mg Intramuscular TID PRN Hobson, Fran E, NP       OLANZapine  zydis (ZYPREXA ) disintegrating tablet 5 mg  5 mg Oral  TID PRN Hobson, Fran E, NP       sertraline  (ZOLOFT ) tablet 50 mg  50 mg Oral Daily Ezzard Staci SAILOR, NP   50 mg at 06/08/24 9056   traZODone  (DESYREL ) tablet 50 mg  50 mg Oral QHS PRN Hobson, Fran E, NP       white petrolatum  (VASELINE) gel   Topical PRN Gottfried, Rhoda J, MD   3 Application at 06/08/24 0944   No current outpatient medications on file.    Labs  Lab Results:  Admission on 06/05/2024, Discharged on 06/05/2024  Component Date Value Ref Range Status   WBC 06/05/2024 5.5  4.0 - 10.5 K/uL Final   RBC 06/05/2024 4.81  4.22 - 5.81 MIL/uL Final   Hemoglobin 06/05/2024 13.7  13.0 - 17.0 g/dL Final   HCT 87/94/7974 40.9  39.0 - 52.0 % Final   MCV 06/05/2024 85.0  80.0 - 100.0 fL Final   MCH 06/05/2024 28.5  26.0 - 34.0 pg Final   MCHC 06/05/2024 33.5  30.0 - 36.0 g/dL Final   RDW 87/94/7974 15.2  11.5 - 15.5 % Final   Platelets 06/05/2024 203  150 - 400 K/uL Final   nRBC 06/05/2024 0.0  0.0 - 0.2 % Final   Neutrophils Relative % 06/05/2024 42  % Final   Neutro Abs 06/05/2024 2.3  1.7 - 7.7 K/uL Final   Lymphocytes Relative 06/05/2024 42  % Final   Lymphs Abs 06/05/2024 2.3  0.7 - 4.0 K/uL Final   Monocytes Relative 06/05/2024 9  % Final   Monocytes Absolute 06/05/2024 0.5  0.1 - 1.0 K/uL Final   Eosinophils Relative 06/05/2024 6  % Final   Eosinophils Absolute 06/05/2024 0.3  0.0 - 0.5 K/uL Final   Basophils Relative 06/05/2024 1  % Final   Basophils Absolute 06/05/2024 0.1  0.0 - 0.1 K/uL Final   Immature Granulocytes 06/05/2024 0  % Final   Abs Immature Granulocytes 06/05/2024 0.01  0.00 - 0.07 K/uL Final   Performed at [facility], [address], [place], [place] [address]   Sodium 06/05/2024 142  135 - 145 mmol/L Final   Potassium 06/05/2024 3.6  3.5 - 5.1 mmol/L Final   Chloride 06/05/2024 102  98 - 111 mmol/L Final   CO2 06/05/2024 28  22 - 32 mmol/L Final   Glucose, Bld 06/05/2024 80  70 - 99 mg/dL Final   Glucose reference range applies only to samples  taken after fasting for at least 8 hours.   BUN 06/05/2024 19  6 - 20 mg/dL Final   Creatinine, Ser 06/05/2024 1.00  0.61 - 1.24 mg/dL Final   Calcium 87/94/7974 9.1  8.9 - 10.3 mg/dL Final   Total Protein 87/94/7974 6.6  6.5 - 8.1 g/dL Final   Albumin 87/94/7974 3.9  3.5 - 5.0 g/dL Final   AST 87/94/7974 17  15 - 41 U/L Final   ALT 06/05/2024 14  0 - 44 U/L Final   Alkaline Phosphatase 06/05/2024 59  38 - 126 U/L Final   Total Bilirubin 06/05/2024 0.2  0.0 - 1.2 mg/dL Final   GFR, Estimated 06/05/2024 >60  >60 mL/min Final   Comment: (NOTE) Calculated using the CKD-EPI Creatinine Equation (2021)    Anion gap 06/05/2024 12  5 - 15 Final   Performed at [facility], [address] [place], [place] [address]   Hgb A1c MFr Bld 06/05/2024 6.0 (H)  4.8 - 5.6 % Final   Comment: (NOTE) Diagnosis of Diabetes The following HbA1c ranges recommended by the American Diabetes Association (ADA) may be used as an aid in the diagnosis of diabetes mellitus.  Hemoglobin             Suggested A1C NGSP%              Diagnosis  <5.7                   Non Diabetic  5.7-6.4                Pre-Diabetic  >6.4                   Diabetic  <7.0                   Glycemic control for                       adults with diabetes.     Mean Plasma Glucose 06/05/2024 125.5  mg/dL Final   Performed at [facility], [address], [place], [place] [address]   Magnesium  06/05/2024 2.0  1.7 - 2.4 mg/dL Final   Performed at [facility], [address], [place], [place] [address]   Alcohol, Ethyl (B) 06/05/2024 <15  <15 mg/dL Final   Comment: (NOTE) For medical purposes only. Performed at [facility], [address] [place], [place] [address]    Color, Urine 06/05/2024 YELLOW  YELLOW Final   APPearance 06/05/2024 CLEAR  CLEAR Final   Specific Gravity, Urine 06/05/2024 1.024  1.005 - 1.030 Final   pH 06/05/2024 6.0  5.0 - 8.0 Final   Glucose, UA 06/05/2024 NEGATIVE  NEGATIVE  mg/dL Final   Hgb urine dipstick 06/05/2024 NEGATIVE  NEGATIVE Final   Bilirubin Urine 06/05/2024 NEGATIVE  NEGATIVE Final   Ketones, ur 06/05/2024 NEGATIVE  NEGATIVE mg/dL Final   Protein, ur 87/94/7974 NEGATIVE  NEGATIVE mg/dL Final   Nitrite 87/94/7974 NEGATIVE  NEGATIVE Final   Leukocytes,Ua 06/05/2024 LARGE (A)  NEGATIVE Final   RBC / HPF 06/05/2024 0-5  0 - 5 RBC/hpf Final   WBC, UA 06/05/2024 0-5  0 - 5 WBC/hpf Final   Bacteria, UA 06/05/2024 NONE SEEN  NONE SEEN Final   Squamous Epithelial / HPF 06/05/2024 0-5  0 - 5 /HPF Final   Mucus 06/05/2024 PRESENT   Final   Performed at [facility], [address] [place], [place] [address]   POC Amphetamine UR 06/05/2024 None Detected  NONE DETECTED (Cut Off Level 1000 ng/mL) Final   POC Secobarbital (BAR) 06/05/2024 None Detected  NONE DETECTED (Cut Off Level 300 ng/mL) Final   POC Buprenorphine (BUP) 06/05/2024 None Detected  NONE DETECTED (Cut Off Level 10 ng/mL) Final   POC Oxazepam (BZO) 06/05/2024 None Detected  NONE DETECTED (Cut Off Level 300 ng/mL) Final   POC Cocaine UR 06/05/2024 Positive (A)  NONE DETECTED (Cut Off Level 300 ng/mL) Final  POC Methamphetamine UR 06/05/2024 None Detected  NONE DETECTED (Cut Off Level 1000 ng/mL) Final   POC Morphine 06/05/2024 None Detected  NONE DETECTED (Cut Off Level 300 ng/mL) Final   POC Methadone UR 06/05/2024 None Detected  NONE DETECTED (Cut Off Level 300 ng/mL) Final   POC Oxycodone UR 06/05/2024 None Detected  NONE DETECTED (Cut Off Level 100 ng/mL) Final   POC Marijuana UR 06/05/2024 Positive (A)  NONE DETECTED (Cut Off Level 50 ng/mL) Final  Admission on 03/22/2024, Discharged on 03/23/2024  Component Date Value Ref Range Status   Sodium 03/22/2024 145  135 - 145 mmol/L Final   Potassium 03/22/2024 3.2 (L)  3.5 - 5.1 mmol/L Final   Chloride 03/22/2024 109  98 - 111 mmol/L Final   CO2 03/22/2024 21 (L)  22 - 32 mmol/L Final   Glucose, Bld 03/22/2024 120 (H)  70 - 99 mg/dL  Final   Glucose reference range applies only to samples taken after fasting for at least 8 hours.   BUN 03/22/2024 14  6 - 20 mg/dL Final   Creatinine, Ser 03/22/2024 1.06  0.61 - 1.24 mg/dL Final   Calcium 90/78/7974 8.3 (L)  8.9 - 10.3 mg/dL Final   Total Protein 90/78/7974 5.9 (L)  6.5 - 8.1 g/dL Final   Albumin 90/78/7974 3.9  3.5 - 5.0 g/dL Final   AST 90/78/7974 24  15 - 41 U/L Final   ALT 03/22/2024 17  0 - 44 U/L Final   Alkaline Phosphatase 03/22/2024 62  38 - 126 U/L Final   Total Bilirubin 03/22/2024 0.4  0.0 - 1.2 mg/dL Final   GFR, Estimated 03/22/2024 >60  >60 mL/min Final   Comment: (NOTE) Calculated using the CKD-EPI Creatinine Equation (2021)    Anion gap 03/22/2024 15  5 - 15 Final   Performed at [facility], [address], [place], [place] [address]   Alcohol, Ethyl (B) 03/22/2024 <15  <15 mg/dL Final   Comment: (NOTE) For medical purposes only. Performed at [facility], [address] [place], [place] [address]    Lipase 03/22/2024 18  11 - 51 U/L Final   Performed at [facility], [address], [place], [place] [address]   Troponin T High Sensitivity 03/22/2024 <15  0 - 19 ng/L Final   Comment: (NOTE) Biotin concentrations > 1000 ng/mL falsely decrease TnT results.  Serial cardiac troponin measurements are suggested.  Refer to the Links section for chest pain algorithms and additional  guidance. Performed at [facility], [address], [place], [place] [address]    WBC 03/22/2024 5.8  4.0 - 10.5 K/uL Final   RBC 03/22/2024 4.11 (L)  4.22 - 5.81 MIL/uL Final   Hemoglobin 03/22/2024 11.4 (L)  13.0 - 17.0 g/dL Final   HCT 90/78/7974 36.4 (L)  39.0 - 52.0 % Final   MCV 03/22/2024 88.6  80.0 - 100.0 fL Final   MCH 03/22/2024 27.7  26.0 - 34.0 pg Final   MCHC 03/22/2024 31.3  30.0 - 36.0 g/dL Final   RDW 90/78/7974 15.9 (H)  11.5 - 15.5 % Final   Platelets 03/22/2024 248  150  - 400 K/uL Final   nRBC 03/22/2024 0.0  0.0 - 0.2 % Final   Neutrophils Relative % 03/22/2024 66  % Final   Neutro Abs 03/22/2024 3.9  1.7 - 7.7 K/uL Final   Lymphocytes Relative 03/22/2024 23  % Final   Lymphs Abs 03/22/2024 1.3  0.7 - 4.0 K/uL Final   Monocytes Relative 03/22/2024 7  % Final   Monocytes Absolute 03/22/2024 0.4  0.1 - 1.0 K/uL Final   Eosinophils Relative 03/22/2024 3  % Final   Eosinophils Absolute 03/22/2024 0.2  0.0 - 0.5 K/uL Final   Basophils Relative 03/22/2024 1  % Final   Basophils Absolute 03/22/2024 0.0  0.0 - 0.1 K/uL Final   Immature Granulocytes 03/22/2024 0  % Final   Abs Immature Granulocytes 03/22/2024 0.01  0.00 - 0.07 K/uL Final   Performed at [facility], [address], [place], [place] [address]   Color, Urine 03/22/2024 YELLOW  YELLOW Final   APPearance 03/22/2024 CLEAR  CLEAR Final   Specific Gravity, Urine 03/22/2024 1.019  1.005 - 1.030 Final   pH 03/22/2024 5.0  5.0 - 8.0 Final   Glucose, UA 03/22/2024 NEGATIVE  NEGATIVE mg/dL Final   Hgb urine dipstick 03/22/2024 NEGATIVE  NEGATIVE Final   Bilirubin Urine 03/22/2024 NEGATIVE  NEGATIVE Final   Ketones, ur 03/22/2024 5 (A)  NEGATIVE mg/dL Final   Protein, ur 90/78/7974 30 (A)  NEGATIVE mg/dL Final   Nitrite 90/78/7974 NEGATIVE  NEGATIVE Final   Leukocytes,Ua 03/22/2024 NEGATIVE  NEGATIVE Final   RBC / HPF 03/22/2024 0-5  0 - 5 RBC/hpf Final   WBC, UA 03/22/2024 0-5  0 - 5 WBC/hpf Final   Bacteria, UA 03/22/2024 NONE SEEN  NONE SEEN Final   Squamous Epithelial / HPF 03/22/2024 0-5  0 - 5 /HPF Final   Mucus 03/22/2024 PRESENT   Final   Hyaline Casts, UA 03/22/2024 PRESENT   Final   Sperm, UA 03/22/2024 PRESENT   Final   Performed at [facility], [address], [place], [place] [address]   Opiates 03/22/2024 NEGATIVE  NEGATIVE Final   Cocaine 03/22/2024 POSITIVE (A)  NEGATIVE Final   Benzodiazepines 03/22/2024 NEGATIVE  NEGATIVE Final   Amphetamines  03/22/2024 NEGATIVE  NEGATIVE Final   Tetrahydrocannabinol 03/22/2024 POSITIVE (A)  NEGATIVE Final   Barbiturates 03/22/2024 NEGATIVE  NEGATIVE Final   Methadone Scn, Ur 03/22/2024 NEGATIVE  NEGATIVE Final   Fentanyl  03/22/2024 NEGATIVE  NEGATIVE Final   Comment: (NOTE) Drug screen is for Medical Purposes only. Positive results are preliminary only. If confirmation is needed, notify lab within 5 days.  Drug Class                 Cutoff (ng/mL) Amphetamine and metabolites 1000 Barbiturate and metabolites 200 Benzodiazepine              200 Opiates and metabolites     300 Cocaine and metabolites     300 THC                         50 Fentanyl                     5 Methadone                   300  Trazodone  is metabolized in vivo to several metabolites,  including pharmacologically active m-CPP, which is excreted in the  urine.  Immunoassay screens for amphetamines and MDMA have potential  cross-reactivity with these compounds and may provide false positive  result.  Performed at [facility], [address] [place], [place] [address]    Troponin T High Sensitivity 03/22/2024 <15  0 - 19 ng/L Final   Comment: HEMOLYZED SPECIMEN -  SUGGEST RECOLLECT (NOTE) Biotin concentrations > 1000 ng/mL falsely decrease TnT results.  Serial cardiac troponin measurements are suggested.  Refer to the Links section for chest pain algorithms and additional  guidance. Performed at [facility], [address], [place], [place] [address]   Admission on 03/02/2024, Discharged on 03/02/2024  Component Date Value Ref Range Status   WBC 03/02/2024 7.6  4.0 - 10.5 K/uL Final   RBC 03/02/2024 4.86  4.22 - 5.81 MIL/uL Final   Hemoglobin 03/02/2024 13.7  13.0 - 17.0 g/dL Final   HCT 90/98/7974 41.0  39.0 - 52.0 % Final   MCV 03/02/2024 84.4  80.0 - 100.0 fL Final   MCH 03/02/2024 28.2  26.0 - 34.0 pg Final   MCHC 03/02/2024 33.4  30.0 - 36.0 g/dL Final   RDW  90/98/7974 14.9  11.5 - 15.5 % Final   Platelets 03/02/2024 207  150 - 400 K/uL Final   nRBC 03/02/2024 0.0  0.0 - 0.2 % Final   Neutrophils Relative % 03/02/2024 62  % Final   Neutro Abs 03/02/2024 4.7  1.7 - 7.7 K/uL Final   Lymphocytes Relative 03/02/2024 24  % Final   Lymphs Abs 03/02/2024 1.8  0.7 - 4.0 K/uL Final   Monocytes Relative 03/02/2024 10  % Final   Monocytes Absolute 03/02/2024 0.8  0.1 - 1.0 K/uL Final   Eosinophils Relative 03/02/2024 3  % Final   Eosinophils Absolute 03/02/2024 0.2  0.0 - 0.5 K/uL Final   Basophils Relative 03/02/2024 1  % Final   Basophils Absolute 03/02/2024 0.1  0.0 - 0.1 K/uL Final   Immature Granulocytes 03/02/2024 0  % Final   Abs Immature Granulocytes 03/02/2024 0.01  0.00 - 0.07 K/uL Final   Performed at [facility], [address] [place], [place] [address]   Sodium 03/02/2024 140  135 - 145 mmol/L Final   Potassium 03/02/2024 3.1 (L)  3.5 - 5.1 mmol/L Final   Chloride 03/02/2024 102  98 - 111 mmol/L Final   BUN 03/02/2024 15  6 - 20 mg/dL Final   Creatinine, Ser 03/02/2024 0.90  0.61 - 1.24 mg/dL Final   Glucose, Bld 90/98/7974 116 (H)  70 - 99 mg/dL Final   Glucose reference range applies only to samples taken after fasting for at least 8 hours.   Calcium, Ion 03/02/2024 1.17  1.15 - 1.40 mmol/L Final   TCO2 03/02/2024 24  22 - 32 mmol/L Final   Hemoglobin 03/02/2024 15.3  13.0 - 17.0 g/dL Final   HCT 90/98/7974 45.0  39.0 - 52.0 % Final   Color, Urine 03/02/2024 YELLOW  YELLOW Final   APPearance 03/02/2024 HAZY (A)  CLEAR Final   Specific Gravity, Urine 03/02/2024 1.026  1.005 - 1.030 Final   pH 03/02/2024 5.0  5.0 - 8.0 Final   Glucose, UA 03/02/2024 NEGATIVE  NEGATIVE mg/dL Final   Hgb urine dipstick 03/02/2024 NEGATIVE  NEGATIVE Final   Bilirubin Urine 03/02/2024 NEGATIVE  NEGATIVE Final   Ketones, ur 03/02/2024 5 (A)  NEGATIVE mg/dL Final   Protein, ur 90/98/7974 NEGATIVE  NEGATIVE mg/dL Final   Nitrite 90/98/7974  NEGATIVE  NEGATIVE Final   Leukocytes,Ua 03/02/2024 NEGATIVE  NEGATIVE Final   Performed at [facility], [address] [place], [place] [address]   RPR Ser Ql 03/02/2024 NON REACTIVE  NON REACTIVE Final   Performed at [facility], [address] [place], [place] [address]   Neisseria Gonorrhea  03/02/2024 Negative   Final   Chlamydia 03/02/2024 Negative   Final   Comment 03/02/2024 Normal Reference Ranger Chlamydia - Negative   Final   Comment 03/02/2024 Normal Reference Range Neisseria Gonorrhea - Negative   Final    Blood Alcohol level:  Lab Results  Component Value Date   [facility] <15 06/05/2024   ETH <15 03/22/2024    Metabolic Disorder Labs: Lab Results  Component Value Date   HGBA1C 6.0 (H) 06/05/2024   MPG 125.5 06/05/2024   MPG 128 (H) 11/06/2010   No results found for: PROLACTIN Lab Results  Component Value Date   CHOL 177 04/29/2019   TRIG 136 04/29/2019   HDL 58 04/29/2019   CHOLHDL 3.1 04/29/2019   VLDL 27 12/18/2010   LDLCALC 95 04/29/2019   LDLCALC 75 12/18/2010    Therapeutic Lab Levels: No results found for: LITHIUM No results found for: VALPROATE No results found for: CBMZ  Physical Findings   PHQ2-9    Flowsheet Row ED from 06/06/2024 in [facility] Nutrition from 09/25/2019 in [facility] at [facility] Visit from 06/12/2013 in [facility] - A Dept Of [place]. [facility] Office Visit from 06/03/2013 in [facility] Med Ctr - A Dept Of [place]. [facility] Office Visit from 03/03/2013 in [facility] - A Dept Of [place]. [facility]  PHQ-2 Total Score 4 1 0 0 0  PHQ-9 Total Score 11 -- -- -- --   Flowsheet Row ED from 06/05/2024 in [facility] ED from 03/02/2024 in [facility] 11/21/2023 in [facility]  Emergency Department at [facility]  C-SSRS RISK CATEGORY No Risk No Risk No Risk     Musculoskeletal  Strength & Muscle Tone: within normal limits Gait & Station: normal Patient leans: N/A  Psychiatric Specialty Exam  Presentation  General Appearance:  Casual  Eye Contact: Fair  Speech: Clear and Coherent  Speech Volume: Normal  Handedness: Right   Mood and Affect  Mood: Depressed  Affect: Appropriate   Thought Process  Thought Processes: Coherent  Descriptions of Associations:Intact  Orientation:Full (Time, Place and Person)  Thought Content:WDL     Hallucinations:Hallucinations: None  Ideas of Reference:None  Suicidal Thoughts:Suicidal Thoughts: No  Homicidal Thoughts:Homicidal Thoughts: No   Sensorium  Memory: Immediate Fair  Judgment: Fair  Insight: Fair   Art Therapist  Concentration: Fair  Attention Span: Fair  Recall: Fair  Fund of Knowledge: Fair  Language: Fair   Psychomotor Activity  Psychomotor Activity: Psychomotor Activity: Normal   Assets  Assets: Resilience   Sleep  Sleep: Sleep: Fair  Estimated Sleeping Duration (Last 24 Hours): 9.00-10.75 hours  Nutritional Assessment (For OBS and FBC admissions only) Has the patient had a weight loss or gain of 10 pounds or more in the last 3 months?: No Has the patient had a decrease in food intake/or appetite?: No Does the patient have dental problems?: No Does the patient have eating habits or behaviors that may be indicators of an eating disorder including binging or inducing vomiting?: No Has the patient recently lost weight without trying?: 0 Has the patient been eating poorly because of a decreased appetite?: 0 Malnutrition Screening Tool Score: 0    Physical Exam  Physical Exam Vitals and nursing note reviewed.  Neurological:     Mental Status:  He is oriented to person, place, and time.  Psychiatric:        Mood and Affect:  Mood normal.        Behavior: Behavior normal.        Thought Content: Thought content normal.        Judgment: Judgment normal.    Review of Systems  Psychiatric/Behavioral:  Positive for depression and substance abuse. Negative for hallucinations and suicidal ideas. The patient is nervous/anxious.        Passive SI   Blood pressure 136/88, pulse (!) 46, temperature 97.9 F (36.6 C), temperature source Oral, resp. rate 16, SpO2 98%. There is no height or weight on file to calculate BMI.  Treatment Plan Summary: Daily contact with patient to assess and evaluate symptoms and progress in treatment, Medication management, and Plan :   Continue Zoloft  50 mg daily As needed CIWA/see chart for protocol -Restarted home medication with metformin  chart reviewed A1c 6.0 Continue trazodone  50 mg p.o. nightly   CSW to follow-up with discharge disposition Patient encouraged to participate in therapeutic milieu  Donia Snell, NP - Student NP 06/08/2024 6:24 PM
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
## 2024-06-08 NOTE — ED Notes (Signed)
 Pt resting quietly.  While awake pt presents with a flat affect.denies SI or AVH.  Safety maontained.

## 2024-06-08 NOTE — Care Plan (Addendum)
 Interdisciplinary Treatment and Diagnostic Plan Update  06/08/2024 Time of Session: 2:00pm William Wood MRN: [id number]  Diagnosis:  Final diagnoses:  Substance induced mood disorder (HCC)  Polysubstance abuse (HCC)     Current Medications:  Current Facility-Administered Medications  Medication Dose Route Frequency Provider Last Rate Last Admin   acetaminophen  (TYLENOL ) tablet 650 mg  650 mg Oral Q6H PRN Hobson, Fran E, NP       alum & mag hydroxide-simeth (MAALOX/MYLANTA) 200-200-20 MG/5ML suspension 30 mL  30 mL Oral Q4H PRN Hobson, Fran E, NP       magnesium  hydroxide (MILK OF MAGNESIA) suspension 30 mL  30 mL Oral Daily PRN Hobson, Fran E, NP       OLANZapine  (ZYPREXA ) injection 5 mg  5 mg Intramuscular TID PRN Hobson, Fran E, NP       OLANZapine  zydis (ZYPREXA ) disintegrating tablet 5 mg  5 mg Oral TID PRN Hobson, Fran E, NP       sertraline  (ZOLOFT ) tablet 50 mg  50 mg Oral Daily Ezzard Staci SAILOR, NP   50 mg at 06/08/24 9056   traZODone  (DESYREL ) tablet 50 mg  50 mg Oral QHS PRN Hobson, Fran E, NP       white petrolatum  (VASELINE) gel   Topical PRN Gottfried, Rhoda J, MD   3 Application at 06/08/24 0944   No current outpatient medications on file.   PTA Medications: Prior to Admission medications   Medication Sig Start Date End Date Taking? Authorizing Provider  metFORMIN  (GLUCOPHAGE ) 1000 MG tablet Take 0.5 tablets (500 mg total) by mouth daily with breakfast. 11/12/19 02/19/20  Iloabachie, Chioma E, NP    Patient Stressors: Legal issue   Substance abuse    Patient Strengths: Ability for insight  Capable of independent living  Communication skills  Motivation for treatment/growth  Supportive family/friends   Treatment Modalities: Medication Management, Group therapy, Case management,  1 to 1 session with clinician, Psychoeducation, Recreational therapy.   Physician Treatment Plan for Primary and Secondary Diagnosis:  Final diagnoses:  Substance induced mood  disorder (HCC)  Polysubstance abuse (HCC)   Long Term Goal(s): Improvement in symptoms so as ready for discharge  Short Term Goals: Patient will verbalize feelings in meetings with treatment team members. Patient will attend at least of 50% of the groups daily. Pt will complete the PHQ9 on admission, day 3 and discharge.  Medication Management: Evaluate patient's response, side effects, and tolerance of medication regimen.  Therapeutic Interventions: 1 to 1 sessions, Unit Group sessions and Medication administration.  Evaluation of Outcomes: Progressing  LCSW Treatment Plan for Primary Diagnosis:  Final diagnoses:  Substance induced mood disorder (HCC)  Polysubstance abuse (HCC)    Long Term Goal(s): Safe transition to appropriate next level of care at discharge.  Short Term Goals: Facilitate acceptance of mental health diagnosis and concerns through verbal commitment to aftercare plan and appointments at discharge.  Therapeutic Interventions: Assess for all discharge needs, 1 to 1 time with Child psychotherapist, Explore available resources and support systems, Assess for adequacy in community support network, Educate family and significant other(s) on suicide prevention, Complete Psychosocial Assessment, Interpersonal group therapy.  Evaluation of Outcomes: Progressing   Progress in Treatment: Attending groups: Yes. Participating in groups: Yes. Taking medication as prescribed: Yes. Toleration medication: No. Family/Significant other contact made: Yes, individual(s) contacted:  Client reports making contact with family during group session Patient understands diagnosis: Yes. Discussing patient identified problems/goals with staff: Yes. Medical problems stabilized or resolved: Yes.  Denies suicidal/homicidal ideation: No. Issues/concerns per patient self-inventory: No. Other: Client is only interested   New problem(s) identified: No, Describe:  Client reports he's previously  attended [address] and [facility].  Client reports he does not wish to return to them for services.  Client was referred to [facility] in [place] ( he will work and get paid but it will go to his living expenses.  Writer will continue to residential options but his options are limited due to no insurance and him denying [facility] and [facility].     New Short Term/Long Term Goal(s):  Client will gain sobriety  Patient Goals:  Client will enter into residential treatment.    Discharge Plan or Barriers: Client has no insurance but is declining to go to the two places in [place] county that does accept individuals with no insurance.  Client reports he wants treatment but not local treatment.  Writer and Providers are exploring alternative options for the client.  Reason for Continuation of Hospitalization: Medical Issues Withdrawal symptoms.  Client heart rate is low per the nurse but they have been monitoring him and he reports no major issues/concerns.   Estimated Length of Stay: Client will stay at [facility] until 12/12 or if a residential unit has a sooner opening.   Last 3 [place] Suicide Severity Risk Score: Flowsheet Row ED from 06/05/2024 in [facility] ED from 03/02/2024 in [facility] Emergency Department at [facility] ED from 11/21/2023 in [facility] Emergency Department at [facility]  C-SSRS RISK CATEGORY No Risk No Risk No Risk    Last [facility] 2/9 Scores:    06/07/2024    9:07 AM 09/25/2019   11:06 AM 06/12/2013    3:38 PM  Depression screen PHQ 2/9  Decreased Interest 2 0 0  Down, Depressed, Hopeless 2 1 0  PHQ - 2 Score 4 1 0  Altered sleeping 1    Tired, decreased energy 2    Change in appetite 1    Feeling bad or failure about yourself  1    Trouble concentrating 2    Moving slowly or fidgety/restless 0    Suicidal thoughts 0    PHQ-9 Score 11    Difficult doing work/chores Somewhat difficult      Scribe for Treatment Team: Nesa Distel,  LCSW 06/08/2024 3:13 PM
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
## 2024-06-08 NOTE — ED Notes (Signed)
 Pt received bed resting quietly.  Without complaints of hearing voices or seeing things.  Denies thoughts for SI.  Pt is calm and cooperative. Offering good eye contact.  Safety maintained.
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
## 2024-06-08 NOTE — ED Notes (Signed)
 Patient asleep, NAD. Will keep monitoring for safety.

## 2024-06-08 NOTE — ED Notes (Signed)
 Patient is in the bedroom composed and sleeping.NAD Will continue to monitor for safety

## 2024-06-08 NOTE — Care Management (Signed)
 [facility] Care Management.Scientist, Research (medical) met with the Client to discuss discharge planning.  Client reports using cocaine, marijuana, and alcohol prior to being admitted.  Client reports being homeless.  Client has pending legal charges for unauthorized use of vehicle and trespassing.  Client's next Court date will be: 07/24/23.   Client reports he previously attended inpatient treatment at [facility] and [facility].  Client has no desire to return to them for treatment. Client is interested in [facility] of [place].  Writer sent the referral to [facility].

## 2024-06-08 NOTE — Care Management (Signed)
 [facility] Care Management...   Addendum   Writer received call from [facility]  Patient completed phone assessment with Patty.  Writer will follow up with patient, if he would like to do inpatient at [facility]  Per Josh at [facility], unable to verify patiens insurance, unable to accept patient.  Writer met with patient to discuss discharge planning.  Patient reported SA, last used 2 days ago. Patient reported pending charges for trespassing and unauthorized use of vehicle. Patient reported looking for inpatient treatment.  Writer will refer to [facility], [facility]

## 2024-06-08 NOTE — ED Notes (Signed)

## 2024-06-09 DIAGNOSIS — F1410 Cocaine abuse, uncomplicated: Secondary | ICD-10-CM | POA: Diagnosis not present

## 2024-06-09 DIAGNOSIS — F1994 Other psychoactive substance use, unspecified with psychoactive substance-induced mood disorder: Secondary | ICD-10-CM | POA: Diagnosis not present

## 2024-06-09 DIAGNOSIS — F1290 Cannabis use, unspecified, uncomplicated: Secondary | ICD-10-CM | POA: Diagnosis not present

## 2024-06-09 DIAGNOSIS — F411 Generalized anxiety disorder: Secondary | ICD-10-CM | POA: Diagnosis not present

## 2024-06-09 LAB — URINALYSIS, ROUTINE W REFLEX MICROSCOPIC
Bacteria, UA: NONE SEEN
Bilirubin Urine: NEGATIVE
Glucose, UA: NEGATIVE mg/dL
Hgb urine dipstick: NEGATIVE
Ketones, ur: NEGATIVE mg/dL
Nitrite: NEGATIVE
Protein, ur: NEGATIVE mg/dL
Specific Gravity, Urine: 1.016 (ref 1.005–1.030)
pH: 5 (ref 5.0–8.0)
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
## 2024-06-09 NOTE — ED Notes (Signed)
 Patient is in bedroom calm and sleeping. NAD Respirations even and unlabored. Will continue for safety.

## 2024-06-09 NOTE — ED Notes (Signed)
 Patient denies pain and is resting comfortably.
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
## 2024-06-09 NOTE — ED Notes (Signed)
 Patient is in the Dayroom calm and composed watching a movie with other patients. Denies SI/HI/AVH. NAD. Environment secured per policy. Will monitor for safety.

## 2024-06-09 NOTE — Care Management (Signed)
 [facility] Care Management...  Per Rosaline, at [facility] called and they stated in order to confirm acceptance for their residential program, the client will need the following things:   Client will need medical clearance from his provider concerning his low heart rate.  Restart of his metformin  medication Update on labs for A1-C.

## 2024-06-09 NOTE — ED Notes (Signed)
 Pt denies si hi and avh- verbal contract for safety provided. Pt says he feels 'well'. Pt presents with intense stare, but is cooperative and polite. Pt c/o indigestion, RN administered prn maalox. Medications reviewed, questions denied.

## 2024-06-09 NOTE — Care Management (Addendum)
 [facility] Care Management...  Addendum 1:56  Patient under review at [facility]  Patient has to call [facility] to complete phone assessment  Referral sent to [facility] met with patient to discuss discharge planning...  Patient completed phone assessment with [facility]  Patient asked if referrals can be sent to [facility] and [facility] faxed referrals to [facility] and [facility]  Patient reported would like to try and get into [facility] or [facility] before committing to [facility]

## 2024-06-09 NOTE — ED Notes (Signed)
 Pt is sleeping quietly at present without complaints. Safety maintained
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
## 2024-06-09 NOTE — ED Notes (Addendum)
 Pt ate lunch. Pt did attend RN education group and provided feedback. No concerns or complaints reported to RN.
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
## 2024-06-09 NOTE — Care Management (Signed)
 [facility] Care Management..  Client has completed a phone interview with [facility].  Client should receive notification by tomorrow if he's been accepted.    [facility] also reported they have an opening.  Client will make a decision by tomorrow on what which residential place he would like to attend.    Client has also been approved for [facility] in [place] but it's a working program and he's unsure if he would like to attend.

## 2024-06-10 DIAGNOSIS — F1994 Other psychoactive substance use, unspecified with psychoactive substance-induced mood disorder: Secondary | ICD-10-CM | POA: Diagnosis not present

## 2024-06-10 DIAGNOSIS — F1290 Cannabis use, unspecified, uncomplicated: Secondary | ICD-10-CM | POA: Diagnosis not present

## 2024-06-10 DIAGNOSIS — F1410 Cocaine abuse, uncomplicated: Secondary | ICD-10-CM | POA: Diagnosis not present

## 2024-06-10 DIAGNOSIS — F411 Generalized anxiety disorder: Secondary | ICD-10-CM | POA: Diagnosis not present

## 2024-06-10 MED ORDER — ACETAMINOPHEN 325 MG PO TABS: 650.0000 mg | ORAL_TABLET | Freq: Four times a day (QID) | ORAL | 0 refills | Status: DC | PRN

## 2024-06-10 MED ORDER — TRAZODONE HCL 50 MG PO TABS: 50.0000 mg | ORAL_TABLET | Freq: Every evening | ORAL | 0 refills | Status: DC | PRN

## 2024-06-10 MED ORDER — SERTRALINE HCL 50 MG PO TABS: 50.0000 mg | ORAL_TABLET | Freq: Every day | ORAL | 0 refills | Status: DC

## 2024-06-10 NOTE — ED Provider Notes (Signed)
 Behavioral Health Progress Note  Date and Time: 06/10/2024 1:57 PM Name: William Wood MRN:  [id number]  HPI: Per admissions assessment: William Wood 58 year old [facility] this seeking detox from alcohol, cocaine and marijuana use.  Reports he is looking forward to residential treatment with [facility] and/or [facility] 6 months or longer.  24 hr  chart review: Sleep Hours last night: good per pt's reports Nursing Concerns: none reported or noted Behavioral episodes in the past 24 ymd:wnwz Medication Compliance: compliant  Vital Signs in the past 24 hrs: WNL. HR is WNL at 60 today. PRN Medications in the past 24 hrs: none in the  past 24 hrs.  Assessment on 06/10/24: On assessment today, the pt reports that their mood is euthymic, improved since admission, and stable. Denies feeling down, depressed, or sad.  Reports that anxiety symptoms are at manageable level.  Sleep is stable. Appetite is stable.  Concentration is without complaint.  Energy level is adequate. Denies having any suicidal thoughts. Denies having any suicidal intent and plan.  Denies having any HI.  Denies having psychotic symptoms.   Denies having side effects to current psychiatric medications. Patient  is stable on current dose of Zoloft  50 mg for GAD and MDD, does not want any further increases in doses prior to discharge.   Discussed discharge planning, with a tentative discharge date of 12/11, pending CSW hearing back from [facility] [place] regarding if they can take him tomorrow or on 12/12.    Labs Reviewed: No new orders placed.  Diagnosis:  Final diagnoses:  Alcohol use disorder  Cocaine use disorder (HCC)  MDD (major depressive disorder), recurrent severe, without psychosis (HCC)   Total Time spent with patient: 45 minutes  Past Psychiatric History: Alcohol abuse disorder, polysubstance abuse. Previous inpatient admissions has attended residential treatment facility  in the past 6 months.  Past Medical History: Back pain Family History: None reported Family Psychiatric  History: None reported Social History: Unemployed, housing instability   Additional Social History: None   Sleep: Fair  Appetite:  Good  Current Medications:  Current Facility-Administered Medications  Medication Dose Route Frequency Provider Last Rate Last Admin   acetaminophen  (TYLENOL ) tablet 650 mg  650 mg Oral Q6H PRN Hobson, Fran E, NP       alum & mag hydroxide-simeth (MAALOX/MYLANTA) 200-200-20 MG/5ML suspension 30 mL  30 mL Oral Q4H PRN Hobson, Fran E, NP   30 mL at 06/09/24 9062   magnesium  hydroxide (MILK OF MAGNESIA) suspension 30 mL  30 mL Oral Daily PRN Hobson, Fran E, NP       OLANZapine  (ZYPREXA ) injection 5 mg  5 mg Intramuscular TID PRN Hobson, Fran E, NP       OLANZapine  zydis (ZYPREXA ) disintegrating tablet 5 mg  5 mg Oral TID PRN Hobson, Fran E, NP       sertraline  (ZOLOFT ) tablet 50 mg  50 mg Oral Daily Ezzard Staci SAILOR, NP   50 mg at 06/10/24 9073   traZODone  (DESYREL ) tablet 50 mg  50 mg Oral QHS PRN Hobson, Fran E, NP       white petrolatum  (VASELINE) gel   Topical PRN Gottfried, Rhoda J, MD   3 Application at 06/08/24 0944   No current outpatient medications on file.   Labs  Lab Results:  Admission on 06/06/2024  Component Date Value Ref Range Status   Color, Urine 06/09/2024 YELLOW  YELLOW Final   APPearance 06/09/2024 CLEAR  CLEAR  Final   Specific Gravity, Urine 06/09/2024 1.016  1.005 - 1.030 Final   pH 06/09/2024 5.0  5.0 - 8.0 Final   Glucose, UA 06/09/2024 NEGATIVE  NEGATIVE mg/dL Final   Hgb urine dipstick 06/09/2024 NEGATIVE  NEGATIVE Final   Bilirubin Urine 06/09/2024 NEGATIVE  NEGATIVE Final   Ketones, ur 06/09/2024 NEGATIVE  NEGATIVE mg/dL Final   Protein, ur 87/90/7974 NEGATIVE  NEGATIVE mg/dL Final   Nitrite 87/90/7974 NEGATIVE  NEGATIVE Final   Leukocytes,Ua 06/09/2024 SMALL (A)  NEGATIVE Final   RBC / HPF 06/09/2024 0-5  0 - 5  RBC/hpf Final   WBC, UA 06/09/2024 11-20  0 - 5 WBC/hpf Final   Bacteria, UA 06/09/2024 NONE SEEN  NONE SEEN Final   Squamous Epithelial / HPF 06/09/2024 0-5  0 - 5 /HPF Final   Performed at [facility], [address] [place], [place] [address]  Admission on 06/05/2024, Discharged on 06/05/2024  Component Date Value Ref Range Status   WBC 06/05/2024 5.5  4.0 - 10.5 K/uL Final   RBC 06/05/2024 4.81  4.22 - 5.81 MIL/uL Final   Hemoglobin 06/05/2024 13.7  13.0 - 17.0 g/dL Final   HCT 87/94/7974 40.9  39.0 - 52.0 % Final   MCV 06/05/2024 85.0  80.0 - 100.0 fL Final   MCH 06/05/2024 28.5  26.0 - 34.0 pg Final   MCHC 06/05/2024 33.5  30.0 - 36.0 g/dL Final   RDW 87/94/7974 15.2  11.5 - 15.5 % Final   Platelets 06/05/2024 203  150 - 400 K/uL Final   nRBC 06/05/2024 0.0  0.0 - 0.2 % Final   Neutrophils Relative % 06/05/2024 42  % Final   Neutro Abs 06/05/2024 2.3  1.7 - 7.7 K/uL Final   Lymphocytes Relative 06/05/2024 42  % Final   Lymphs Abs 06/05/2024 2.3  0.7 - 4.0 K/uL Final   Monocytes Relative 06/05/2024 9  % Final   Monocytes Absolute 06/05/2024 0.5  0.1 - 1.0 K/uL Final   Eosinophils Relative 06/05/2024 6  % Final   Eosinophils Absolute 06/05/2024 0.3  0.0 - 0.5 K/uL Final   Basophils Relative 06/05/2024 1  % Final   Basophils Absolute 06/05/2024 0.1  0.0 - 0.1 K/uL Final   Immature Granulocytes 06/05/2024 0  % Final   Abs Immature Granulocytes 06/05/2024 0.01  0.00 - 0.07 K/uL Final   Performed at [facility], [address] [place], [place] [address]   Sodium 06/05/2024 142  135 - 145 mmol/L Final   Potassium 06/05/2024 3.6  3.5 - 5.1 mmol/L Final   Chloride 06/05/2024 102  98 - 111 mmol/L Final   CO2 06/05/2024 28  22 - 32 mmol/L Final   Glucose, Bld 06/05/2024 80  70 - 99 mg/dL Final   Glucose reference range applies only to samples taken after fasting for at least 8 hours.   BUN 06/05/2024 19  6 - 20 mg/dL Final   Creatinine, Ser 06/05/2024 1.00  0.61 - 1.24  mg/dL Final   Calcium 87/94/7974 9.1  8.9 - 10.3 mg/dL Final   Total Protein 87/94/7974 6.6  6.5 - 8.1 g/dL Final   Albumin 87/94/7974 3.9  3.5 - 5.0 g/dL Final   AST 87/94/7974 17  15 - 41 U/L Final   ALT 06/05/2024 14  0 - 44 U/L Final   Alkaline Phosphatase 06/05/2024 59  38 - 126 U/L Final   Total Bilirubin 06/05/2024 0.2  0.0 - 1.2 mg/dL Final  GFR, Estimated 06/05/2024 >60  >60 mL/min Final   Comment: (NOTE) Calculated using the CKD-EPI Creatinine Equation (2021)    Anion gap 06/05/2024 12  5 - 15 Final   Performed at [facility], [address], [place], [place] [address]   Hgb A1c MFr Bld 06/05/2024 6.0 (H)  4.8 - 5.6 % Final   Comment: (NOTE) Diagnosis of Diabetes The following HbA1c ranges recommended by the American Diabetes Association (ADA) may be used as an aid in the diagnosis of diabetes mellitus.  Hemoglobin             Suggested A1C NGSP%              Diagnosis  <5.7                   Non Diabetic  5.7-6.4                Pre-Diabetic  >6.4                   Diabetic  <7.0                   Glycemic control for                       adults with diabetes.     Mean Plasma Glucose 06/05/2024 125.5  mg/dL Final   Performed at [facility], [address] [place], [place] [address]   Magnesium  06/05/2024 2.0  1.7 - 2.4 mg/dL Final   Performed at [facility], [address] [place], [place] [address]   Alcohol, Ethyl (B) 06/05/2024 <15  <15 mg/dL Final   Comment: (NOTE) For medical purposes only. Performed at [facility], [address], [place], [place] [address]    Color, Urine 06/05/2024 YELLOW  YELLOW Final   APPearance 06/05/2024 CLEAR  CLEAR Final   Specific Gravity, Urine 06/05/2024 1.024  1.005 - 1.030 Final   pH 06/05/2024 6.0  5.0 - 8.0 Final   Glucose, UA 06/05/2024 NEGATIVE  NEGATIVE mg/dL Final   Hgb urine dipstick 06/05/2024 NEGATIVE  NEGATIVE Final   Bilirubin Urine 06/05/2024 NEGATIVE  NEGATIVE Final    Ketones, ur 06/05/2024 NEGATIVE  NEGATIVE mg/dL Final   Protein, ur 87/94/7974 NEGATIVE  NEGATIVE mg/dL Final   Nitrite 87/94/7974 NEGATIVE  NEGATIVE Final   Leukocytes,Ua 06/05/2024 LARGE (A)  NEGATIVE Final   RBC / HPF 06/05/2024 0-5  0 - 5 RBC/hpf Final   WBC, UA 06/05/2024 0-5  0 - 5 WBC/hpf Final   Bacteria, UA 06/05/2024 NONE SEEN  NONE SEEN Final   Squamous Epithelial / HPF 06/05/2024 0-5  0 - 5 /HPF Final   Mucus 06/05/2024 PRESENT   Final   Performed at [facility], [address], [place], [place] [address]   POC Amphetamine UR 06/05/2024 None Detected  NONE DETECTED (Cut Off Level 1000 ng/mL) Final   POC Secobarbital (BAR) 06/05/2024 None Detected  NONE DETECTED (Cut Off Level 300 ng/mL) Final   POC Buprenorphine (BUP) 06/05/2024 None Detected  NONE DETECTED (Cut Off Level 10 ng/mL) Final   POC Oxazepam (BZO) 06/05/2024 None Detected  NONE DETECTED (Cut Off Level 300 ng/mL) Final   POC Cocaine UR 06/05/2024 Positive (A)  NONE DETECTED (Cut Off Level 300 ng/mL) Final   POC Methamphetamine UR 06/05/2024 None Detected  NONE DETECTED (Cut Off Level 1000 ng/mL) Final  POC Morphine 06/05/2024 None Detected  NONE DETECTED (Cut Off Level 300 ng/mL) Final   POC Methadone UR 06/05/2024 None Detected  NONE DETECTED (Cut Off Level 300 ng/mL) Final   POC Oxycodone UR 06/05/2024 None Detected  NONE DETECTED (Cut Off Level 100 ng/mL) Final   POC Marijuana UR 06/05/2024 Positive (A)  NONE DETECTED (Cut Off Level 50 ng/mL) Final  Admission on 03/22/2024, Discharged on 03/23/2024  Component Date Value Ref Range Status   Sodium 03/22/2024 145  135 - 145 mmol/L Final   Potassium 03/22/2024 3.2 (L)  3.5 - 5.1 mmol/L Final   Chloride 03/22/2024 109  98 - 111 mmol/L Final   CO2 03/22/2024 21 (L)  22 - 32 mmol/L Final   Glucose, Bld 03/22/2024 120 (H)  70 - 99 mg/dL Final   Glucose reference range applies only to samples taken after fasting for at least 8 hours.   BUN 03/22/2024 14  6 - 20  mg/dL Final   Creatinine, Ser 03/22/2024 1.06  0.61 - 1.24 mg/dL Final   Calcium 90/78/7974 8.3 (L)  8.9 - 10.3 mg/dL Final   Total Protein 90/78/7974 5.9 (L)  6.5 - 8.1 g/dL Final   Albumin 90/78/7974 3.9  3.5 - 5.0 g/dL Final   AST 90/78/7974 24  15 - 41 U/L Final   ALT 03/22/2024 17  0 - 44 U/L Final   Alkaline Phosphatase 03/22/2024 62  38 - 126 U/L Final   Total Bilirubin 03/22/2024 0.4  0.0 - 1.2 mg/dL Final   GFR, Estimated 03/22/2024 >60  >60 mL/min Final   Comment: (NOTE) Calculated using the CKD-EPI Creatinine Equation (2021)    Anion gap 03/22/2024 15  5 - 15 Final   Performed at [facility], [address] [place], [place] [address]   Alcohol, Ethyl (B) 03/22/2024 <15  <15 mg/dL Final   Comment: (NOTE) For medical purposes only. Performed at [facility], [address], [place], [place] [address]    Lipase 03/22/2024 18  11 - 51 U/L Final   Performed at [facility], [address], [place], [place] [address]   Troponin T High Sensitivity 03/22/2024 <15  0 - 19 ng/L Final   Comment: (NOTE) Biotin concentrations > 1000 ng/mL falsely decrease TnT results.  Serial cardiac troponin measurements are suggested.  Refer to the Links section for chest pain algorithms and additional  guidance. Performed at [facility], [address], [place], [place] [address]    WBC 03/22/2024 5.8  4.0 - 10.5 K/uL Final   RBC 03/22/2024 4.11 (L)  4.22 - 5.81 MIL/uL Final   Hemoglobin 03/22/2024 11.4 (L)  13.0 - 17.0 g/dL Final   HCT 90/78/7974 36.4 (L)  39.0 - 52.0 % Final   MCV 03/22/2024 88.6  80.0 - 100.0 fL Final   MCH 03/22/2024 27.7  26.0 - 34.0 pg Final   MCHC 03/22/2024 31.3  30.0 - 36.0 g/dL Final   RDW 90/78/7974 15.9 (H)  11.5 - 15.5 % Final   Platelets 03/22/2024 248  150 - 400 K/uL Final   nRBC 03/22/2024 0.0  0.0 - 0.2 % Final   Neutrophils Relative % 03/22/2024 66  % Final   Neutro Abs  03/22/2024 3.9  1.7 - 7.7 K/uL Final   Lymphocytes Relative 03/22/2024 23  % Final   Lymphs Abs 03/22/2024 1.3  0.7 - 4.0 K/uL Final   Monocytes Relative 03/22/2024 7  % Final   Monocytes  Absolute 03/22/2024 0.4  0.1 - 1.0 K/uL Final   Eosinophils Relative 03/22/2024 3  % Final   Eosinophils Absolute 03/22/2024 0.2  0.0 - 0.5 K/uL Final   Basophils Relative 03/22/2024 1  % Final   Basophils Absolute 03/22/2024 0.0  0.0 - 0.1 K/uL Final   Immature Granulocytes 03/22/2024 0  % Final   Abs Immature Granulocytes 03/22/2024 0.01  0.00 - 0.07 K/uL Final   Performed at [facility], [address], [place], [place] [address]   Color, Urine 03/22/2024 YELLOW  YELLOW Final   APPearance 03/22/2024 CLEAR  CLEAR Final   Specific Gravity, Urine 03/22/2024 1.019  1.005 - 1.030 Final   pH 03/22/2024 5.0  5.0 - 8.0 Final   Glucose, UA 03/22/2024 NEGATIVE  NEGATIVE mg/dL Final   Hgb urine dipstick 03/22/2024 NEGATIVE  NEGATIVE Final   Bilirubin Urine 03/22/2024 NEGATIVE  NEGATIVE Final   Ketones, ur 03/22/2024 5 (A)  NEGATIVE mg/dL Final   Protein, ur 90/78/7974 30 (A)  NEGATIVE mg/dL Final   Nitrite 90/78/7974 NEGATIVE  NEGATIVE Final   Leukocytes,Ua 03/22/2024 NEGATIVE  NEGATIVE Final   RBC / HPF 03/22/2024 0-5  0 - 5 RBC/hpf Final   WBC, UA 03/22/2024 0-5  0 - 5 WBC/hpf Final   Bacteria, UA 03/22/2024 NONE SEEN  NONE SEEN Final   Squamous Epithelial / HPF 03/22/2024 0-5  0 - 5 /HPF Final   Mucus 03/22/2024 PRESENT   Final   Hyaline Casts, UA 03/22/2024 PRESENT   Final   Sperm, UA 03/22/2024 PRESENT   Final   Performed at [facility], [address], [place], [place] [address]   Opiates 03/22/2024 NEGATIVE  NEGATIVE Final   Cocaine 03/22/2024 POSITIVE (A)  NEGATIVE Final   Benzodiazepines 03/22/2024 NEGATIVE  NEGATIVE Final   Amphetamines 03/22/2024 NEGATIVE  NEGATIVE Final   Tetrahydrocannabinol 03/22/2024 POSITIVE (A)  NEGATIVE Final   Barbiturates  03/22/2024 NEGATIVE  NEGATIVE Final   Methadone Scn, Ur 03/22/2024 NEGATIVE  NEGATIVE Final   Fentanyl  03/22/2024 NEGATIVE  NEGATIVE Final   Comment: (NOTE) Drug screen is for Medical Purposes only. Positive results are preliminary only. If confirmation is needed, notify lab within 5 days.  Drug Class                 Cutoff (ng/mL) Amphetamine and metabolites 1000 Barbiturate and metabolites 200 Benzodiazepine              200 Opiates and metabolites     300 Cocaine and metabolites     300 THC                         50 Fentanyl                     5 Methadone                   300  Trazodone  is metabolized in vivo to several metabolites,  including pharmacologically active m-CPP, which is excreted in the  urine.  Immunoassay screens for amphetamines and MDMA have potential  cross-reactivity with these compounds and may provide false positive  result.  Performed at [facility], [address], [place], [place] [address]    Troponin T High Sensitivity 03/22/2024 <15  0 - 19 ng/L Final   Comment: HEMOLYZED SPECIMEN - SUGGEST RECOLLECT (NOTE) Biotin concentrations > 1000 ng/mL falsely decrease TnT results.  Serial cardiac troponin  measurements are suggested.  Refer to the Links section for chest pain algorithms and additional  guidance. Performed at [facility], [address] [place], [place] [address]   Admission on 03/02/2024, Discharged on 03/02/2024  Component Date Value Ref Range Status   WBC 03/02/2024 7.6  4.0 - 10.5 K/uL Final   RBC 03/02/2024 4.86  4.22 - 5.81 MIL/uL Final   Hemoglobin 03/02/2024 13.7  13.0 - 17.0 g/dL Final   HCT 90/98/7974 41.0  39.0 - 52.0 % Final   MCV 03/02/2024 84.4  80.0 - 100.0 fL Final   MCH 03/02/2024 28.2  26.0 - 34.0 pg Final   MCHC 03/02/2024 33.4  30.0 - 36.0 g/dL Final   RDW 90/98/7974 14.9  11.5 - 15.5 % Final   Platelets 03/02/2024 207  150 - 400 K/uL Final   nRBC 03/02/2024 0.0  0.0 -  0.2 % Final   Neutrophils Relative % 03/02/2024 62  % Final   Neutro Abs 03/02/2024 4.7  1.7 - 7.7 K/uL Final   Lymphocytes Relative 03/02/2024 24  % Final   Lymphs Abs 03/02/2024 1.8  0.7 - 4.0 K/uL Final   Monocytes Relative 03/02/2024 10  % Final   Monocytes Absolute 03/02/2024 0.8  0.1 - 1.0 K/uL Final   Eosinophils Relative 03/02/2024 3  % Final   Eosinophils Absolute 03/02/2024 0.2  0.0 - 0.5 K/uL Final   Basophils Relative 03/02/2024 1  % Final   Basophils Absolute 03/02/2024 0.1  0.0 - 0.1 K/uL Final   Immature Granulocytes 03/02/2024 0  % Final   Abs Immature Granulocytes 03/02/2024 0.01  0.00 - 0.07 K/uL Final   Performed at [facility], [address] [place], [place] [address]   Sodium 03/02/2024 140  135 - 145 mmol/L Final   Potassium 03/02/2024 3.1 (L)  3.5 - 5.1 mmol/L Final   Chloride 03/02/2024 102  98 - 111 mmol/L Final   BUN 03/02/2024 15  6 - 20 mg/dL Final   Creatinine, Ser 03/02/2024 0.90  0.61 - 1.24 mg/dL Final   Glucose, Bld 90/98/7974 116 (H)  70 - 99 mg/dL Final   Glucose reference range applies only to samples taken after fasting for at least 8 hours.   Calcium, Ion 03/02/2024 1.17  1.15 - 1.40 mmol/L Final   TCO2 03/02/2024 24  22 - 32 mmol/L Final   Hemoglobin 03/02/2024 15.3  13.0 - 17.0 g/dL Final   HCT 90/98/7974 45.0  39.0 - 52.0 % Final   Color, Urine 03/02/2024 YELLOW  YELLOW Final   APPearance 03/02/2024 HAZY (A)  CLEAR Final   Specific Gravity, Urine 03/02/2024 1.026  1.005 - 1.030 Final   pH 03/02/2024 5.0  5.0 - 8.0 Final   Glucose, UA 03/02/2024 NEGATIVE  NEGATIVE mg/dL Final   Hgb urine dipstick 03/02/2024 NEGATIVE  NEGATIVE Final   Bilirubin Urine 03/02/2024 NEGATIVE  NEGATIVE Final   Ketones, ur 03/02/2024 5 (A)  NEGATIVE mg/dL Final   Protein, ur 90/98/7974 NEGATIVE  NEGATIVE mg/dL Final   Nitrite 90/98/7974 NEGATIVE  NEGATIVE Final   Leukocytes,Ua 03/02/2024 NEGATIVE  NEGATIVE Final   Performed at [facility], [address], [place], [place] [address]   RPR Ser Ql 03/02/2024 NON REACTIVE  NON REACTIVE Final   Performed at [facility], [address], [place], [place] [address]   Neisseria Gonorrhea 03/02/2024 Negative   Final   Chlamydia 03/02/2024 Negative   Final   Comment  03/02/2024 Normal Reference Ranger Chlamydia - Negative   Final   Comment 03/02/2024 Normal Reference Range Neisseria Gonorrhea - Negative   Final   Blood Alcohol level:  Lab Results  Component Value Date   [facility] <15 06/05/2024   ETH <15 03/22/2024   Metabolic Disorder Labs: Lab Results  Component Value Date   HGBA1C 6.0 (H) 06/05/2024   MPG 125.5 06/05/2024   MPG 128 (H) 11/06/2010   No results found for: PROLACTIN Lab Results  Component Value Date   CHOL 177 04/29/2019   TRIG 136 04/29/2019   HDL 58 04/29/2019   CHOLHDL 3.1 04/29/2019   VLDL 27 12/18/2010   LDLCALC 95 04/29/2019   LDLCALC 75 12/18/2010   Therapeutic Lab Levels: No results found for: LITHIUM No results found for: VALPROATE No results found for: CBMZ  Physical Findings   PHQ2-9    Flowsheet Row ED from 06/06/2024 in [facility] Nutrition from 09/25/2019 in [facility] Nutrition & Diabetes Education Services at [facility] Visit from 06/12/2013 in [facility] - A Dept Of [place]. [facility] Office Visit from 06/03/2013 in [facility]tr - A Dept Of [employer]. [facility] Office Visit from 03/03/2013 in [facility] - A Dept Of [place]. [facility]  PHQ-2 Total Score 2 1 0 0 0  PHQ-9 Total Score 3 -- -- -- --   Flowsheet Row ED from 06/06/2024 in [facility] ED from 06/05/2024 in [facility] ED from 03/02/2024 in [facility] Emergency Department at [facility]  C-SSRS RISK CATEGORY No Risk No Risk No Risk    Musculoskeletal  Strength & Muscle Tone: within  normal limits Gait & Station: normal Patient leans: N/A  Psychiatric Specialty Exam  Presentation  General Appearance:  Appropriate for Environment; Casual  Eye Contact: Fair  Speech: Clear and Coherent  Speech Volume: Normal  Handedness: Right  Mood and Affect  Mood: Euthymic  Affect: Congruent   Thought Process  Thought Processes: Coherent  Descriptions of Associations:Intact  Orientation:Full (Time, Place and Person)  Thought Content:Logical     Hallucinations:Hallucinations: None  Ideas of Reference:None  Suicidal Thoughts:Suicidal Thoughts: No  Homicidal Thoughts:Homicidal Thoughts: No  Sensorium  Memory: Immediate Fair  Judgment: Fair  Insight: Fair  cutive Functions  Concentration: Fair  Attention Span: Fair  Recall: Fair  Fund of Knowledge: Fair  Language: Fair  Psychomotor Activity  Psychomotor Activity: Psychomotor Activity: Normal  Assets  Assets: Desire for Improvement   Sleep  Sleep: Sleep: Good  Estimated Sleeping Duration (Last 24 Hours): 6.50-8.25 hours  Nutritional Assessment (For OBS and FBC admissions only) Has the patient had a weight loss or gain of 10 pounds or more in the last 3 months?: No Has the patient had a decrease in food intake/or appetite?: No Does the patient have dental problems?: No Does the patient have eating habits or behaviors that may be indicators of an eating disorder including binging or inducing vomiting?: No Has the patient recently lost weight without trying?: 0 Has the patient been eating poorly because of a decreased appetite?: 0 Malnutrition Screening Tool Score: 0    Physical Exam  Physical Exam Vitals and nursing note reviewed.  Constitutional:      Appearance: Normal appearance.  Musculoskeletal:     Cervical back: Normal range of motion.  Neurological:     General: No focal  deficit present.     Mental Status: He is alert and oriented to person, place, and  time.  Psychiatric:        Mood and Affect: Mood normal.        Behavior: Behavior normal.        Thought Content: Thought content normal.        Judgment: Judgment normal.    Review of Systems  Psychiatric/Behavioral:  Positive for depression and substance abuse. Negative for hallucinations, memory loss and suicidal ideas. The patient is nervous/anxious. The patient does not have insomnia.        Passive SI  All other systems reviewed and are negative.  Blood pressure 119/83, pulse 60, temperature 98.2 F (36.8 C), temperature source Oral, resp. rate 18, SpO2 100%. There is no height or weight on file to calculate BMI.  Treatment Plan Summary: Daily contact with patient to assess and evaluate symptoms and progress in treatment, Medication management, and Plan :   -Continue Zoloft  50 mg daily -Holding off increasing due to persistently low HR, and pt not wanting further increases.  CSW to follow-up with discharge disposition Patient encouraged to participate in therapeutic milieu  Donia Snell, NP - Student NP 06/10/2024 1:57 PM
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
## 2024-06-10 NOTE — ED Notes (Signed)
 Patient is in the bedroom calm and  sleeping.

## 2024-06-10 NOTE — ED Notes (Signed)
 Patient is in bedroom calm and sleeping. NAD Respirations even and unlabored. Will continue for safety.
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
## 2024-06-10 NOTE — ED Notes (Signed)
 Patient is in the Dayroom calm, composed and pleasant watching TV with other patients. NAD. Respirations even and unlabored.Environment secured per policy. Will continue to monitor for safety.

## 2024-06-10 NOTE — Care Management (Addendum)
 [facility] Care Management...  Addendum 2:56 pm  Patient requested in patient treatment  Per Emmie Caldron @ [facility]  Patient has been accepted to their program  Writer coordinated an intake appointment for Thursday 06/11/24 @ 9:am  Patient can discharge Thursday 06/11/2024 at 8 am to   [facility] [address]  [place], [place] [address] Phone: [phone]   RN to arrange transportation  7 day samples and Scripts    Addendum 1:24 pm  Per Emmie Caldron @ [facility]   Patient has been accepted to their program. Emmie Caldron needs to confirm that they will have the staff available to accommodate doing the intake/screening tomorrow.     Addendum 1:05 pm  Patient currently on phone completing phone assessment with Emmie Caldron @ [facility]  Addendum 8:31 am  Writer met with patient to advise of phon interview with [facility].  Patient reported speaking with sponsor last night and could possibly get into  Dentist received email from Emmie Caldron at [facility] wanting to schedule phone assessment with patient  Writer coordinated a phone assessment for 1 pm today.
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
## 2024-06-10 NOTE — ED Notes (Signed)
 Patient remains on unit with no acute changes noted at this time. Alert and oriented.   Interacting with peers/staff minimally.    No current suicidal or homicidal ideation reported. No hallucinations or delusions observed.  Patient is tolerating the milieu. Vitals within normal limits.   Medications administered as ordered; no adverse effects noted.

## 2024-06-11 DIAGNOSIS — F1290 Cannabis use, unspecified, uncomplicated: Secondary | ICD-10-CM | POA: Diagnosis not present

## 2024-06-11 DIAGNOSIS — F411 Generalized anxiety disorder: Secondary | ICD-10-CM | POA: Diagnosis not present

## 2024-06-11 DIAGNOSIS — F1994 Other psychoactive substance use, unspecified with psychoactive substance-induced mood disorder: Secondary | ICD-10-CM | POA: Diagnosis not present

## 2024-06-11 DIAGNOSIS — F1410 Cocaine abuse, uncomplicated: Secondary | ICD-10-CM | POA: Diagnosis not present

## 2024-06-11 MED ORDER — TRAZODONE HCL 50 MG PO TABS: 50.0000 mg | ORAL_TABLET | Freq: Every evening | ORAL | 0 refills | Status: AC | PRN

## 2024-06-11 MED ORDER — SERTRALINE HCL 50 MG PO TABS: 50.0000 mg | ORAL_TABLET | Freq: Every day | ORAL | 0 refills | Status: AC

## 2024-06-11 MED ORDER — ACETAMINOPHEN 325 MG PO TABS: 650.0000 mg | ORAL_TABLET | Freq: Four times a day (QID) | ORAL | 0 refills | Status: AC | PRN
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
## 2024-06-11 NOTE — ED Notes (Signed)
Patient asleep at this time. NAD.

## 2024-06-11 NOTE — ED Notes (Signed)
 Pt presents as calm and cooperative. Pt denied si hi and avh. Discharge paperwork discussed with pt, questions denied. Pt denied physical pain and discomforts. Pt provided with belongings and medication samples and prescriptions. Pt escorted off unit by staff to taxi.

## 2024-06-11 NOTE — Discharge Instructions (Signed)

## 2024-06-11 NOTE — ED Provider Notes (Signed)
 FBC/OBS ASAP Discharge Summary  Date and Time: 06/11/2024 9:34 AM  Name: William Wood  MRN:  [id number]   Discharge Diagnoses:  Final diagnoses:  Alcohol use disorder  Cocaine use disorder [facility])  MDD (major depressive disorder), recurrent severe, without psychosis (HCC)   William Wood is a 58 year old male who presented to [facility] behavioral health on 06/05/2024.  Upon admission patient requested treatment for his substance use disorder. Chart reviewed and patient discussed with attending psychiatrist, Dr. Lawrnce on 06/11/2024.  Patient is assessed by this nurse practitioner face-to-face.  Patient is alert and oriented, pleasant and cooperative during assessment.  William Wood endorses average sleep and appetite.  He reports compliance with medications.  He denies symptoms of withdrawal.   Patient is appropriate and engaged with staff and peers. Subjective: Patient states I am [facility] to [facility] today, I hope that I can smoke there.  Patient uses tobacco cigarettes daily, declines nicotine replacement.  Patient states if I have to I will stop smoking for the time that I am there.  Stay Summary: William Wood was admitted for alcohol use disorder, cocaine use disorder and MDD and crisis management.  He was treated with the following medications sertraline  and trazodone .  William Wood was discharged with current medication and was instructed on how to take medications as prescribed; (details listed below under Medication List).  Medical problems were identified and treated as needed.   Improvement was monitored by observation and William Wood daily report of symptom reduction.  Emotional and mental status was monitored by daily assessment completed by William Wood and clinical staff.         William Wood was evaluated by the treatment team for stability and plans for continued recovery upon discharge.  William Wood motivation was an integral factor for scheduling  further treatment.  Employment, transportation, bed availability, health status, family support, and any pending legal issues were also considered during his hospital stay.  He was offered further treatment options upon discharge including but not limited to Residential, Intensive Outpatient, and Outpatient treatment.  William Wood will [facility] up [facility].  Upon completion of this admission the William Wood was both mentally and medically stable for discharge denying suicidal/homicidal ideation, symptoms that would be consistent with psychosis (AVH, IOR, paranoia, etc).   On my interview today, day of discharge, 06/11/2024 patient is in NAD, alert, oriented, calm, cooperative, and attentive, with normal affect, speech, and behavior. Objectively, there is no evidence of psychosis/ mania (able to converse coherently, linear and goal directed thought, no RIS, no distractibility, not pre-occupied, no FOI, etc) nor depression to the point of suicidality (able to concentrate, affect full and reactive, speech normal r/v/t, no psychomotor retardation/agitation, etc).  Overall, patient appears to be at the point, in the absence of inhibiting or disinhibiting symptoms, where he can successfully move to lesser restrictive setting for care.    06/05/2024 1757pm History of Present illness: William Wood is a 58 y.o. male presenting to [facility] unaccompanied. Pt states that he was sent here from [facility] to receive a medical clearance for his ongoing substance use problem. PT states he used Cocaine today, roughly a gram and a half. Pt states that he has been struggling with his addiction for 18 months off and on. Pt does report he is using daily. Pt states he is using alcohol on occasions as well. Pt endorses slight HI  thoughts towards anyone that bothers him. Pt states that he is also homeless and is needing a place to stay, stating that is why he is here. Pt  denies Si, Hi and AVH currently.    Pt is seen face-to-face on the [facility] treatment area. Pt is alert & oriented x 4 and engages in today's visit. Today, patient states that he came in today to get help with drugs and mental health.  States that he was released from [facility] 4 months ago after completing a 30-day stay.  Patient states that he had a return to use of substances 3 weeks after discharge from [facility].  He endorses using cocaine or I think it is cocaine and what ever else they mix it with.  He estimates his cocaine use is 1 to 1-1/2 g daily via smoking with last use being this morning.  Endorses the use of marijuana 3 blunts a day with last use this morning.  He endorses the use of alcohol as much as I can get and estimates that he drinks 3-4 mikes hard lemonade's most days of the week with last use this morning.  He denies methamphetamine.  He is unsure about heroin or fentanyl  as he thinks that may have been mixed in with his cocaine because this time I feel different.  He states that cocaine causes him paranoia which leads to him experiencing bouts of anger.  States that he is beginning to experience symptoms of withdrawal that he describes as agitation, irritability and craving substances.  States that he has spoken with Rosaline at [facility] regarding residential program there.  States he was advised to come here for medical clearance.  He endorses passive suicidal ideation I think about it.  He denies plan or intent.  He endorses 1 previous suicide attempt years ago when I cut my wrist.  He denies AVH.  He denies homicidal ideation, intent or plan.  He does endorse that he wants to harm individuals when they distract me and take stuff from me.  States he has four brothers who have passed away from ETOH and substance use. Discussed admission to the facility based crisis [facility]) for treatment of polysubstance use.  Patient endorses that he is motivated for treatment stating that he would  like to get back to working.  Patient is agreeable to signing in voluntarily for substance use treatment at this time.    Total Time spent with patient: 30 minutes  Past Psychiatric History: Polysubstance abuse, cocaine use disorder, depression, anxiety Past Medical History: Allergic rhinitis, bradycardia, congenital fusion of cervical spine, degenerative disc disease, diabetes mellitus without complication, GERD Family psychiatric history: 4 brothers have passed away from alcohol and substance use disorders Family History: None reported Social History: Patient currently homeless, unemployed, endorses cocaine use, alcohol use and THC use. Tobacco Cessation:  A prescription for an FDA-approved tobacco cessation medication was offered at discharge and the patient refused  Current Medications:  Current Facility-Administered Medications  Medication Dose Route Frequency Provider Last Rate Last Admin   acetaminophen  (TYLENOL ) tablet 650 mg  650 mg Oral Q6H PRN Hobson, Fran E, NP       alum & mag hydroxide-simeth (MAALOX/MYLANTA) 200-200-20 MG/5ML suspension 30 mL  30 mL Oral Q4H PRN Hobson, Fran E, NP   30 mL at 06/09/24 9062   magnesium  hydroxide (MILK OF MAGNESIA) suspension 30 mL  30 mL Oral Daily PRN Hobson, Fran E, NP       OLANZapine  (ZYPREXA ) injection 5 mg  5 mg Intramuscular TID PRN Hobson, Fran E, NP       OLANZapine  zydis (ZYPREXA ) disintegrating tablet 5 mg  5 mg Oral TID PRN Hobson, Fran E, NP       sertraline  (ZOLOFT ) tablet 50 mg  50 mg Oral Daily Lewis, Tanika N, NP   50 mg at 06/10/24 9073   traZODone  (DESYREL ) tablet 50 mg  50 mg Oral QHS PRN Hobson, Fran E, NP       white petrolatum  (VASELINE) gel   Topical PRN Gottfried, Rhoda J, MD   3 Application at 06/08/24 [phone]   Current Outpatient Medications  Medication Sig Dispense Refill   acetaminophen  (TYLENOL ) 325 MG tablet Take 2 tablets (650 mg total) by mouth every 6 (six) hours as needed for mild pain (pain score 1-3). 30  tablet 0   sertraline  (ZOLOFT ) 50 MG tablet Take 1 tablet (50 mg total) by mouth daily. 30 tablet 0   traZODone  (DESYREL ) 50 MG tablet Take 1 tablet (50 mg total) by mouth at bedtime as needed for sleep. 30 tablet 0    PTA Medications:  PTA Medications  Medication Sig   acetaminophen  (TYLENOL ) 325 MG tablet Take 2 tablets (650 mg total) by mouth every 6 (six) hours as needed for mild pain (pain score 1-3).   sertraline  (ZOLOFT ) 50 MG tablet Take 1 tablet (50 mg total) by mouth daily.   traZODone  (DESYREL ) 50 MG tablet Take 1 tablet (50 mg total) by mouth at bedtime as needed for sleep.   Facility Ordered Medications  Medication   acetaminophen  (TYLENOL ) tablet 650 mg   alum & mag hydroxide-simeth (MAALOX/MYLANTA) 200-200-20 MG/5ML suspension 30 mL   magnesium  hydroxide (MILK OF MAGNESIA) suspension 30 mL   OLANZapine  zydis (ZYPREXA ) disintegrating tablet 5 mg   OLANZapine  (ZYPREXA ) injection 5 mg   traZODone  (DESYREL ) tablet 50 mg   sertraline  (ZOLOFT ) tablet 50 mg   white petrolatum  (VASELINE) gel       06/10/2024    3:17 PM 06/08/2024    6:25 PM 06/07/2024    9:07 AM  Depression screen PHQ 2/9  Decreased Interest 1 1 2   Down, Depressed, Hopeless 0 1 2  PHQ - 2 Score 1 2 4   Altered sleeping 0 1 1  Tired, decreased energy 0 0 2  Change in appetite 0 0 1  Feeling bad or failure about yourself  0 0 1  Trouble concentrating 0 0 2  Moving slowly or fidgety/restless 0 0 0  Suicidal thoughts 0 0 0  PHQ-9 Score 1 3 11   Difficult doing work/chores Not difficult at all Not difficult at all Somewhat difficult    Flowsheet Row ED from 06/06/2024 in [facility] ED from 06/05/2024 in [facility] ED from 03/02/2024 in [facility] Emergency Department at [facility]  C-SSRS RISK CATEGORY No Risk No Risk No Risk    Musculoskeletal  Strength & Muscle Tone: within normal limits Gait & Station: normal Patient leans:  N/A  Psychiatric Specialty Exam  Presentation  General Appearance:  Appropriate for Environment; Casual  Eye Contact: Good  Speech: Clear and Coherent; Normal Rate  Speech Volume: Normal  Handedness: Right   Mood and Affect  Mood: Euthymic  Affect: Appropriate; Congruent   Thought Process  Thought Processes: Coherent; Goal Directed; Linear  Descriptions of Associations:Intact  Orientation:Full (Time, Place and Person)  Thought Content:Logical; WDL     Hallucinations:Hallucinations: None  Ideas of Reference:None  Suicidal Thoughts:Suicidal  Thoughts: No  Homicidal Thoughts:Homicidal Thoughts: No   Sensorium  Memory: Immediate Good; Recent Fair  Judgment: Good  Insight: Good   Executive Functions  Concentration: Good  Attention Span: Good  Recall: Good  Fund of Knowledge: Good  Language: Good   Psychomotor Activity  Psychomotor Activity: Psychomotor Activity: Normal   Assets  Assets: Communication Skills; Desire for Improvement; Financial Resources/Insurance; Social Support; Resilience   Sleep  Sleep: Sleep: Good  Estimated Sleeping Duration (Last 24 Hours): 8.50-10.75 hours  Nutritional Assessment (For OBS and FBC admissions only) Has the patient had a weight loss or gain of 10 pounds or more in the last 3 months?: No Has the patient had a decrease in food intake/or appetite?: No Does the patient have dental problems?: No Does the patient have eating habits or behaviors that may be indicators of an eating disorder including binging or inducing vomiting?: No Has the patient recently lost weight without trying?: 0 Has the patient been eating poorly because of a decreased appetite?: 0 Malnutrition Screening Tool Score: 0    Physical Exam  Physical Exam Vitals and nursing note reviewed.  Constitutional:      Appearance: Normal appearance. He is well-developed.  HENT:     Head: Normocephalic and atraumatic.      Nose: Nose normal.  Cardiovascular:     Rate and Rhythm: Normal rate.  Pulmonary:     Effort: Pulmonary effort is normal.  Musculoskeletal:        General: Normal range of motion.     Cervical back: Normal range of motion.  Skin:    General: Skin is warm and dry.  Neurological:     Mental Status: He is alert and oriented to person, place, and time.  Psychiatric:        Attention and Perception: Attention and perception normal.        Mood and Affect: Mood and affect normal.        Speech: Speech normal.        Behavior: Behavior normal. Behavior is cooperative.        Thought Content: Thought content normal.        Cognition and Memory: Cognition and memory normal.    Review of Systems  Constitutional: Negative.   HENT: Negative.    Eyes: Negative.   Respiratory: Negative.    Cardiovascular: Negative.   Gastrointestinal: Negative.   Genitourinary: Negative.   Musculoskeletal: Negative.   Skin: Negative.   Neurological: Negative.   Psychiatric/Behavioral: Negative.     Blood pressure 106/80, pulse (!) 44, temperature 97.6 F (36.4 C), temperature source Oral, resp. rate 17, SpO2 97%. There is no height or weight on file to calculate BMI.  Demographic Factors:  Male, Low socioeconomic status, and Unemployed  Loss Factors: Financial problems/change in socioeconomic status  Historical Factors: Family history of mental illness or substance abuse  Risk Reduction Factors:   Positive social support, Positive therapeutic relationship, and Positive coping skills or problem solving skills  Continued Clinical Symptoms:  Alcohol/Substance Abuse/Dependencies Previous Psychiatric Diagnoses and Treatments  Cognitive Features That Contribute To Risk:  None    Suicide Risk:  Minimal: No identifiable suicidal ideation.  Patients presenting with no risk factors but with morbid ruminations; may be classified as minimal risk based on the severity of the depressive symptoms  Plan  Of Care/Follow-up recommendations:  Follow-up with outpatient primary care, resources provided. Follow-up with outpatient psychiatry for medication management and individual counseling, resources provided. Follow-up with substance use treatment  resources including caring services.  Patient will be transported from [facility] behavioral health urgent care to [facility] today.  Medication list: -Acetaminophen  650 mg by mouth every 6 hours as needed for mild pain, pain score 1/10-3/10. -Sertraline  50 mg daily/mood -Trazodone  50 mg nightly as needed/sleep  Disposition: Discharge  Ellouise LITTIE Dawn, FNP 06/11/2024, 9:34 AM
